# Patient Record
Sex: Male | Born: 1960
Health system: Southern US, Community
[De-identification: ages and names within clinical notes are randomized; demographics above are authoritative.]

## PROBLEM LIST (undated history)

## (undated) DIAGNOSIS — K219 Gastro-esophageal reflux disease without esophagitis: Secondary | ICD-10-CM

## (undated) DIAGNOSIS — I10 Essential (primary) hypertension: Secondary | ICD-10-CM

## (undated) DIAGNOSIS — F419 Anxiety disorder, unspecified: Secondary | ICD-10-CM

## (undated) HISTORY — DX: Gastro-esophageal reflux disease without esophagitis: K21.9

## (undated) HISTORY — PX: EYE SURGERY: SHX253

## (undated) HISTORY — PX: NO PAST SURGERIES: SHX2092

---

## 2004-07-26 ENCOUNTER — Ambulatory Visit: Payer: Self-pay | Admitting: Internal Medicine

## 2004-08-03 ENCOUNTER — Ambulatory Visit: Payer: Self-pay | Admitting: Internal Medicine

## 2006-12-27 ENCOUNTER — Inpatient Hospital Stay: Payer: Self-pay | Admitting: Internal Medicine

## 2008-10-03 ENCOUNTER — Emergency Department: Payer: Self-pay | Admitting: Emergency Medicine

## 2009-11-29 ENCOUNTER — Emergency Department: Payer: Self-pay | Admitting: Emergency Medicine

## 2012-07-23 ENCOUNTER — Ambulatory Visit: Payer: Self-pay | Admitting: General Practice

## 2012-08-01 ENCOUNTER — Encounter: Payer: Self-pay | Admitting: Orthopedic Surgery

## 2012-08-24 ENCOUNTER — Encounter: Payer: Self-pay | Admitting: Orthopedic Surgery

## 2012-08-25 ENCOUNTER — Ambulatory Visit: Payer: Self-pay | Admitting: Orthopedic Surgery

## 2013-01-11 ENCOUNTER — Emergency Department: Payer: Self-pay | Admitting: Emergency Medicine

## 2013-01-11 LAB — URINALYSIS, COMPLETE
Bacteria: NONE SEEN
Bilirubin,UR: NEGATIVE
Blood: NEGATIVE
Glucose,UR: NEGATIVE mg/dL (ref 0–75)
Ketone: NEGATIVE
Leukocyte Esterase: NEGATIVE
Nitrite: NEGATIVE
Ph: 6 (ref 4.5–8.0)
Protein: 30
RBC,UR: 1 /HPF (ref 0–5)
Specific Gravity: 1.027 (ref 1.003–1.030)
Squamous Epithelial: <1
WBC UR: <1 /HPF (ref 0–5)

## 2013-01-11 LAB — COMPREHENSIVE METABOLIC PANEL
Albumin: 3.5 g/dL (ref 3.4–5.0)
Alkaline Phosphatase: 110 U/L
Anion Gap: 3 — ABNORMAL LOW (ref 7–16)
BUN: 21 mg/dL — ABNORMAL HIGH (ref 7–18)
Bilirubin,Total: 0.3 mg/dL (ref 0.2–1.0)
Calcium, Total: 8.8 mg/dL (ref 8.5–10.1)
Chloride: 105 mmol/L (ref 98–107)
Co2: 29 mmol/L (ref 21–32)
Creatinine: 1.04 mg/dL (ref 0.60–1.30)
EGFR (African American): >60
EGFR (Non-African Amer.): >60
Glucose: 106 mg/dL — ABNORMAL HIGH (ref 65–99)
Osmolality: 277 (ref 275–301)
Potassium: 3.7 mmol/L (ref 3.5–5.1)
SGOT(AST): 31 U/L (ref 15–37)
SGPT (ALT): 27 U/L (ref 12–78)
Sodium: 137 mmol/L (ref 136–145)
Total Protein: 8.2 g/dL (ref 6.4–8.2)

## 2013-01-11 LAB — CBC
HCT: 43.7 % (ref 40.0–52.0)
HGB: 14.3 g/dL (ref 13.0–18.0)
MCH: 28.9 pg (ref 26.0–34.0)
MCHC: 32.7 g/dL (ref 32.0–36.0)
MCV: 88 fL (ref 80–100)
Platelet: 296 10*3/uL (ref 150–440)
RBC: 4.95 10*6/uL (ref 4.40–5.90)
RDW: 12.8 % (ref 11.5–14.5)
WBC: 8.7 10*3/uL (ref 3.8–10.6)

## 2013-01-11 LAB — LIPASE, BLOOD: Lipase: 230 U/L (ref 73–393)

## 2013-12-27 ENCOUNTER — Encounter (INDEPENDENT_AMBULATORY_CARE_PROVIDER_SITE_OTHER): Payer: Self-pay | Admitting: *Deleted

## 2013-12-27 NOTE — Telephone Encounter (Signed)
This encounter was created in error - please disregard.

## 2014-12-22 ENCOUNTER — Other Ambulatory Visit: Payer: Self-pay | Admitting: Family Medicine

## 2014-12-22 NOTE — Telephone Encounter (Signed)
Call for apt 

## 2015-02-06 ENCOUNTER — Encounter: Payer: Self-pay | Admitting: Family Medicine

## 2015-03-11 ENCOUNTER — Ambulatory Visit: Payer: Self-pay | Admitting: Family Medicine

## 2015-04-20 DIAGNOSIS — H4042X0 Glaucoma secondary to eye inflammation, left eye, stage unspecified: Secondary | ICD-10-CM | POA: Diagnosis not present

## 2015-04-21 DIAGNOSIS — H4042X3 Glaucoma secondary to eye inflammation, left eye, severe stage: Secondary | ICD-10-CM | POA: Diagnosis not present

## 2015-04-29 ENCOUNTER — Encounter: Payer: Self-pay | Admitting: *Deleted

## 2015-04-29 ENCOUNTER — Encounter: Payer: Self-pay | Admitting: Anesthesiology

## 2015-04-30 ENCOUNTER — Ambulatory Visit: Payer: Self-pay | Admitting: Family Medicine

## 2015-04-30 NOTE — Discharge Instructions (Signed)

## 2015-05-04 ENCOUNTER — Encounter
Admission: RE | Disposition: A | Discharge status: Home / Self Care | Payer: Self-pay | Source: Ambulatory Visit | Attending: Ophthalmology

## 2015-05-04 ENCOUNTER — Ambulatory Visit: Payer: Self-pay | Admitting: Physician Assistant

## 2015-05-04 ENCOUNTER — Ambulatory Visit
Admission: RE | Admit: 2015-05-04 | Discharge: 2015-05-04 | Disposition: A | Discharge status: Home / Self Care | Payer: 59 | Source: Ambulatory Visit | Attending: Ophthalmology | Admitting: Ophthalmology

## 2015-05-04 ENCOUNTER — Encounter: Payer: Self-pay | Admitting: Physician Assistant

## 2015-05-04 VITALS — BP 180/95 | HR 66 | Temp 97.9°F

## 2015-05-04 DIAGNOSIS — Z5329 Procedure and treatment not carried out because of patient's decision for other reasons: Secondary | ICD-10-CM | POA: Diagnosis not present

## 2015-05-04 DIAGNOSIS — H4042X0 Glaucoma secondary to eye inflammation, left eye, stage unspecified: Secondary | ICD-10-CM | POA: Insufficient documentation

## 2015-05-04 DIAGNOSIS — J3089 Other allergic rhinitis: Secondary | ICD-10-CM

## 2015-05-04 DIAGNOSIS — I10 Essential (primary) hypertension: Secondary | ICD-10-CM

## 2015-05-04 DIAGNOSIS — K219 Gastro-esophageal reflux disease without esophagitis: Secondary | ICD-10-CM

## 2015-05-04 HISTORY — DX: Essential (primary) hypertension: I10

## 2015-05-04 SURGERY — INSERTION, GLAUCOMA VALVE, AHMED
Anesthesia: Topical | Laterality: Left

## 2015-05-04 MED ORDER — LACTATED RINGERS IV SOLN: INTRAVENOUS | Status: DC

## 2015-05-04 MED ORDER — PANTOPRAZOLE SODIUM 40 MG PO TBEC: 40.0000 mg | DELAYED_RELEASE_TABLET | Freq: Every day | ORAL | Status: DC

## 2015-05-04 MED ORDER — BENAZEPRIL HCL 40 MG PO TABS: 40.0000 mg | ORAL_TABLET | Freq: Every day | ORAL | Status: DC

## 2015-05-04 MED ORDER — MOXIFLOXACIN HCL 0.5 % OP SOLN: 1.0000 [drp] | OPHTHALMIC | Status: DC | PRN

## 2015-05-04 MED ORDER — FLUTICASONE PROPIONATE 50 MCG/ACT NA SUSP: 2.0000 | Freq: Every day | NASAL | Status: DC

## 2015-05-04 SURGICAL SUPPLY — 35 items
APPLICATOR COTTON TIP 3IN (MISCELLANEOUS) ×2 IMPLANT
BANDAGE EYE OVAL (MISCELLANEOUS) ×4 IMPLANT
BLADE SCLEROTME MULTI-SIDE (MISCELLANEOUS) IMPLANT
CANNULA ANT/CHMB 27GA (MISCELLANEOUS) ×4 IMPLANT
CORD BIP STRL DISP 12FT (MISCELLANEOUS) ×2 IMPLANT
CUP MEDICINE 2OZ PLAST GRAD ST (MISCELLANEOUS) ×2 IMPLANT
ERASER TAPRD BLUNT STR 20-23GA (MISCELLANEOUS) ×1 IMPLANT
GLOVE BIO SURGEON STRL SZ7 (GLOVE) ×2 IMPLANT
GLOVE SURG LX 6.5 MICRO (GLOVE) ×1
GLOVE SURG LX STRL 6.5 MICRO (GLOVE) ×1 IMPLANT
GOWN STRL REUS W/ TWL LRG LVL3 (GOWN DISPOSABLE) ×2 IMPLANT
GOWN STRL REUS W/TWL LRG LVL3 (GOWN DISPOSABLE) ×2
KNIFE OPTIMUM SIDEPORT 15DEG (MISCELLANEOUS) IMPLANT
KNIFE SIDECUT EYE (MISCELLANEOUS) ×2 IMPLANT
MARKER SKIN DUAL TIP RULER LAB (MISCELLANEOUS) ×2 IMPLANT
NDL SAFETY 22GX1.5 (NEEDLE) ×2 IMPLANT
NEEDLE FILTER BLUNT 18X 1/2SAF (NEEDLE) ×2
NEEDLE FILTER BLUNT 18X1 1/2 (NEEDLE) ×2 IMPLANT
NEEDLE HYPO 30GX1 BEV (NEEDLE) IMPLANT
PACK EYE AFTER SURG (MISCELLANEOUS) ×2 IMPLANT
PROTECTOR LASIK FLAP (MISCELLANEOUS) ×2 IMPLANT
SOL BAL SALT 15ML (MISCELLANEOUS) ×4
SOLUTION BAL SALT 15ML (MISCELLANEOUS) ×2 IMPLANT
SPONGE SURG I SPEAR (MISCELLANEOUS) ×6 IMPLANT
SUT ETHILON 10-0 CS-B-6CS-B-6 (SUTURE)
SUT ETHILON 8 0 TG100 8 (SUTURE) ×2 IMPLANT
SUT VICRYL 7 0 TG140 8 (SUTURE) ×2 IMPLANT
SUT VICRYL 8 0 BV 130 5 (SUTURE) ×2 IMPLANT
SUTURE EHLN 10-0 CS-B-6CS-B-6 (SUTURE) IMPLANT
SYR 3ML LL SCALE MARK (SYRINGE) ×8 IMPLANT
SYR 5ML LL (SYRINGE) IMPLANT
SYRINGE 10CC LL (SYRINGE) ×2 IMPLANT
TAPERED BLUNT TIP STR 20-23GA (MISCELLANEOUS) ×2
WATER STERILE IRR 250ML POUR (IV SOLUTION) ×2 IMPLANT
WIPE NON LINTING 3.25X3.25 (MISCELLANEOUS) ×2 IMPLANT

## 2015-05-04 NOTE — Progress Notes (Signed)
S: states his bp has been elevated, hasn't taken his bp meds in a year, went to have surgery on his eye this morning and it was so high they wouldn't do anything, cannot see his pcp until next week; also out of reflux med and nasal spray; denies headache, cp/sob, swelling in legs, remainder ros neg  O: bp elevated at 180/95 , perrl eomi, lungs c t a, cv rrr, no pedal edema  A: htn  P: one month rx  For benazepril 40mg  qd, protonix 40mg , and flonase, discussed htn with patient and family member; pt knows to f/u with his pcp as needs a yearly physical and his physician needs to monitor his bp

## 2015-05-04 NOTE — Progress Notes (Signed)
Patient verbalizes he is anxious about having this procedure done today. Dr.Vin & Dr. Jaci Standard spoke with patient about the procedure and anesthesia. Patient verbalizes he is unsure if he can proceed with this procedure today. Patient is being given a few moments to talk with his family privately.  0945 Patient has decided to not go through with procedure today. Dr. Cephus Shelling informed. Dr. Loletta Parish she will have the office call patient to reschedule.

## 2015-05-11 ENCOUNTER — Encounter: Payer: Self-pay | Admitting: *Deleted

## 2015-05-13 ENCOUNTER — Ambulatory Visit (INDEPENDENT_AMBULATORY_CARE_PROVIDER_SITE_OTHER): Payer: 59 | Admitting: Family Medicine

## 2015-05-13 ENCOUNTER — Encounter: Payer: Self-pay | Admitting: Family Medicine

## 2015-05-13 VITALS — BP 179/78 | HR 65 | Temp 98.9°F | Ht 65.6 in | Wt 160.0 lb

## 2015-05-13 DIAGNOSIS — I10 Essential (primary) hypertension: Secondary | ICD-10-CM | POA: Diagnosis not present

## 2015-05-13 MED ORDER — BENAZEPRIL HCL 40 MG PO TABS: 40.0000 mg | ORAL_TABLET | Freq: Every day | ORAL | Status: DC

## 2015-05-13 MED ORDER — AMLODIPINE BESYLATE 5 MG PO TABS: 10.0000 mg | ORAL_TABLET | Freq: Every day | ORAL | Status: DC

## 2015-05-13 NOTE — Assessment & Plan Note (Signed)
Discuss poor control and need to get urgent control because of upcoming surgery will start amlodipine 5 mg to take 2 tablets a day. Patient will monitor blood pressure if gets too low will drop back to 5 also discussed side effect of ankle edema will put up with that if it's a problem until after I surgery to control blood pressure.

## 2015-05-13 NOTE — Progress Notes (Signed)
   BP 179/78 mmHg  Pulse 65  Temp(Src) 98.9 F (37.2 C)  Ht 5' 5.6" (1.666 m)  Wt 160 lb (72.576 kg)  BMI 26.15 kg/m2  SpO2 99%   Subjective:    Patient ID: Shaun Carney, male    DOB: Mar 05, 1960, 55 y.o.   MRN: [id number]  HPI: Shaun Carney is a 55 y.o. male  Chief Complaint  Patient presents with  . Hypertension  Discuss hypertension patient's been off medicines for some time has eye surgery scheduled had to be canceled for glaucoma as blood pressure was too high restarted Benzapril 40 mg for this last week blood pressures come down some but not a lot but it's actually better than it has been. Discussed patient has surgery scheduled in 5 days again for his I need to get blood pressure down so can save his vision. Otherwise patient feels well  Relevant past medical, surgical, family and social history reviewed and updated as indicated. Interim medical history since our last visit reviewed. Allergies and medications reviewed and updated.  Review of Systems  Constitutional: Negative.   Respiratory: Negative.   Cardiovascular: Negative.     Per HPI unless specifically indicated above     Objective:    BP 179/78 mmHg  Pulse 65  Temp(Src) 98.9 F (37.2 C)  Ht 5' 5.6" (1.666 m)  Wt 160 lb (72.576 kg)  BMI 26.15 kg/m2  SpO2 99%  Wt Readings from Last 3 Encounters:  05/13/15 160 lb (72.576 kg)  05/11/15 158 lb (71.668 kg)  05/04/15 158 lb (71.668 kg)    Physical Exam  Constitutional: He is oriented to person, place, and time. He appears well-developed and well-nourished. No distress.  HENT:  Head: Normocephalic and atraumatic.  Right Ear: Hearing normal.  Left Ear: Hearing normal.  Nose: Nose normal.  Eyes: Conjunctivae and lids are normal. Right eye exhibits no discharge. Left eye exhibits no discharge. No scleral icterus.  Cardiovascular: Normal rate, regular rhythm and normal heart sounds.   Pulmonary/Chest: Effort normal and breath sounds normal. No respiratory  distress.  Musculoskeletal: Normal range of motion.  Neurological: He is alert and oriented to person, place, and time.  Skin: Skin is intact. No rash noted.  Psychiatric: He has a normal mood and affect. His speech is normal and behavior is normal. Judgment and thought content normal. Cognition and memory are normal.        Assessment & Plan:   Problem List Items Addressed This Visit      Cardiovascular and Mediastinum   Essential hypertension - Primary    Discuss poor control and need to get urgent control because of upcoming surgery will start amlodipine 5 mg to take 2 tablets a day. Patient will monitor blood pressure if gets too low will drop back to 5 also discussed side effect of ankle edema will put up with that if it's a problem until after I surgery to control blood pressure.          Follow up plan: Return in about 4 weeks (around 06/10/2015) for Check BMP.

## 2015-05-15 NOTE — Discharge Instructions (Signed)

## 2015-05-18 ENCOUNTER — Ambulatory Visit: Payer: 59 | Admitting: Anesthesiology

## 2015-05-18 ENCOUNTER — Ambulatory Visit
Admission: RE | Admit: 2015-05-18 | Discharge: 2015-05-18 | Disposition: A | Discharge status: Home / Self Care | Payer: 59 | Source: Ambulatory Visit | Attending: Ophthalmology | Admitting: Ophthalmology

## 2015-05-18 ENCOUNTER — Encounter
Admission: RE | Disposition: A | Discharge status: Home / Self Care | Payer: Self-pay | Source: Ambulatory Visit | Attending: Ophthalmology

## 2015-05-18 DIAGNOSIS — Z7951 Long term (current) use of inhaled steroids: Secondary | ICD-10-CM | POA: Diagnosis not present

## 2015-05-18 DIAGNOSIS — Z9109 Other allergy status, other than to drugs and biological substances: Secondary | ICD-10-CM | POA: Diagnosis not present

## 2015-05-18 DIAGNOSIS — H209 Unspecified iridocyclitis: Secondary | ICD-10-CM | POA: Insufficient documentation

## 2015-05-18 DIAGNOSIS — H4042X0 Glaucoma secondary to eye inflammation, left eye, stage unspecified: Secondary | ICD-10-CM | POA: Insufficient documentation

## 2015-05-18 DIAGNOSIS — Z79899 Other long term (current) drug therapy: Secondary | ICD-10-CM | POA: Diagnosis not present

## 2015-05-18 DIAGNOSIS — I10 Essential (primary) hypertension: Secondary | ICD-10-CM | POA: Diagnosis not present

## 2015-05-18 HISTORY — PX: INSERTION OF AHMED VALVE: SHX6254

## 2015-05-18 SURGERY — INSERTION, GLAUCOMA VALVE, AHMED
Anesthesia: Monitor Anesthesia Care | Laterality: Left | Wound class: Clean

## 2015-05-18 MED ORDER — LIDOCAINE HCL (PF) 4 % IJ SOLN
INTRAMUSCULAR | Status: DC | PRN
  Administered 2015-05-18: 1 mL via INTRADERMAL

## 2015-05-18 MED ORDER — MOXIFLOXACIN HCL 0.5 % OP SOLN
1.0000 [drp] | OPHTHALMIC | Status: DC | PRN
  Administered 2015-05-18 (×3): 1 [drp] via OPHTHALMIC

## 2015-05-18 MED ORDER — BSS IO SOLN
INTRAOCULAR | Status: DC | PRN
  Administered 2015-05-18: 15 mL via INTRAOCULAR

## 2015-05-18 MED ORDER — NEOMYCIN-POLYMYXIN-DEXAMETH 3.5-10000-0.1 OP OINT
TOPICAL_OINTMENT | OPHTHALMIC | Status: DC | PRN
  Administered 2015-05-18: 1 via OPHTHALMIC

## 2015-05-18 MED ORDER — FENTANYL CITRATE (PF) 100 MCG/2ML IJ SOLN
INTRAMUSCULAR | Status: DC | PRN
  Administered 2015-05-18: 100 ug via INTRAVENOUS

## 2015-05-18 MED ORDER — LACTATED RINGERS IV SOLN: INTRAVENOUS | Status: DC

## 2015-05-18 MED ORDER — ACETAMINOPHEN 160 MG/5ML PO SOLN: 325.0000 mg | ORAL | Status: DC | PRN

## 2015-05-18 MED ORDER — MIDAZOLAM HCL 2 MG/2ML IJ SOLN
INTRAMUSCULAR | Status: DC | PRN
  Administered 2015-05-18: 2 mg via INTRAVENOUS

## 2015-05-18 MED ORDER — ACETAMINOPHEN 325 MG PO TABS: 325.0000 mg | ORAL_TABLET | ORAL | Status: DC | PRN

## 2015-05-18 SURGICAL SUPPLY — 38 items
ALLOGRAFT TUTOPLST SCER0.5X1.0 (Tissue) ×1 IMPLANT
APPLICATOR COTTON TIP 3IN (MISCELLANEOUS) ×2 IMPLANT
BANDAGE EYE OVAL (MISCELLANEOUS) ×4 IMPLANT
BLADE SCLEROTME MULTI-SIDE (MISCELLANEOUS) IMPLANT
CANNULA ANT/CHMB 27GA (MISCELLANEOUS) ×4 IMPLANT
CORD BIP STRL DISP 12FT (MISCELLANEOUS) ×2 IMPLANT
CUP MEDICINE 2OZ PLAST GRAD ST (MISCELLANEOUS) ×2 IMPLANT
ERASER TAPRD BLUNT STR 20-23GA (MISCELLANEOUS) ×1 IMPLANT
GLOVE BIO SURGEON STRL SZ7 (GLOVE) ×2 IMPLANT
GLOVE SURG LX 6.5 MICRO (GLOVE) ×1
GLOVE SURG LX STRL 6.5 MICRO (GLOVE) ×1 IMPLANT
GOWN STRL REUS W/ TWL LRG LVL3 (GOWN DISPOSABLE) ×2 IMPLANT
GOWN STRL REUS W/TWL LRG LVL3 (GOWN DISPOSABLE) ×2
KNIFE OPTIMUM SIDEPORT 15DEG (MISCELLANEOUS) IMPLANT
KNIFE SIDECUT EYE (MISCELLANEOUS) ×2 IMPLANT
MARKER SKIN DUAL TIP RULER LAB (MISCELLANEOUS) ×2 IMPLANT
NDL SAFETY 22GX1.5 (NEEDLE) ×2 IMPLANT
NEEDLE FILTER BLUNT 18X 1/2SAF (NEEDLE) ×2
NEEDLE FILTER BLUNT 18X1 1/2 (NEEDLE) ×2 IMPLANT
NEEDLE HYPO 30GX1 BEV (NEEDLE) IMPLANT
PACK EYE AFTER SURG (MISCELLANEOUS) ×2 IMPLANT
PROTECTOR LASIK FLAP (MISCELLANEOUS) ×2 IMPLANT
SOL BAL SALT 15ML (MISCELLANEOUS) ×4
SOLUTION BAL SALT 15ML (MISCELLANEOUS) ×2 IMPLANT
SPONGE SURG I SPEAR (MISCELLANEOUS) ×6 IMPLANT
SUT ETHILON 10-0 CS-B-6CS-B-6 (SUTURE) ×2
SUT ETHILON 8 0 TG100 8 (SUTURE) ×2 IMPLANT
SUT VICRYL 7 0 TG140 8 (SUTURE) ×2 IMPLANT
SUT VICRYL 8 0 BV 130 5 (SUTURE) ×2 IMPLANT
SUTURE EHLN 10-0 CS-B-6CS-B-6 (SUTURE) ×1 IMPLANT
SYR 3ML LL SCALE MARK (SYRINGE) ×8 IMPLANT
SYR 5ML LL (SYRINGE) IMPLANT
SYRINGE 10CC LL (SYRINGE) ×2 IMPLANT
TAPERED BLUNT TIP STR 20-23GA (MISCELLANEOUS) ×2
TUTOPLAST SCIERA 0.5X1.0 (Tissue) ×2 IMPLANT
VALVE GLAUCOMA AHMED (Prosthesis & Implant Heart) ×2 IMPLANT
WATER STERILE IRR 250ML POUR (IV SOLUTION) ×2 IMPLANT
WIPE NON LINTING 3.25X3.25 (MISCELLANEOUS) ×2 IMPLANT

## 2015-05-18 NOTE — H&P (Signed)
H+P reviewed and is up to date, please see paper chart.  

## 2015-05-18 NOTE — Anesthesia Postprocedure Evaluation (Signed)
Anesthesia Post Note  Patient: Shaun Carney  Procedure(s) Performed: Procedure(s) (LRB): INSERTION OF AHMED VALVE AND SCLERAL PATCH GRAFT TO LEFT EYE (Left)  Patient location during evaluation: PACU Anesthesia Type: MAC Level of consciousness: awake and alert and oriented Pain management: satisfactory to patient Vital Signs Assessment: post-procedure vital signs reviewed and stable Respiratory status: spontaneous breathing, nonlabored ventilation and respiratory function stable Cardiovascular status: blood pressure returned to baseline and stable Postop Assessment: Adequate PO intake and No signs of nausea or vomiting Anesthetic complications: no    Raliegh Ip

## 2015-05-18 NOTE — Transfer of Care (Signed)
Immediate Anesthesia Transfer of Care Note  Patient: Shaun Carney  Procedure(s) Performed: Procedure(s) with comments: INSERTION OF AHMED VALVE AND SCLERAL PATCH GRAFT TO LEFT EYE (Left) - 1ST CASE PER DR VIN  Patient Location: PACU  Anesthesia Type: MAC  Level of Consciousness: awake, alert  and patient cooperative  Airway and Oxygen Therapy: Patient Spontanous Breathing and Patient connected to supplemental oxygen  Post-op Assessment: Post-op Vital signs reviewed, Patient's Cardiovascular Status Stable, Respiratory Function Stable, Patent Airway and No signs of Nausea or vomiting  Post-op Vital Signs: Reviewed and stable  Complications: No apparent anesthesia complications

## 2015-05-18 NOTE — Anesthesia Procedure Notes (Signed)
Procedure Name: MAC Performed by: Kritika Stukes Pre-anesthesia Checklist: Patient identified, Emergency Drugs available, Suction available, Timeout performed and Patient being monitored Patient Re-evaluated:Patient Re-evaluated prior to inductionOxygen Delivery Method: Nasal cannula Placement Confirmation: positive ETCO2     

## 2015-05-18 NOTE — Anesthesia Preprocedure Evaluation (Signed)
Anesthesia Evaluation  Patient identified by MRN, date of birth, ID band  Reviewed: Allergy & Precautions, H&P , NPO status , Patient's Chart, lab work & pertinent test results  Airway Mallampati: II  TM Distance: >3 FB Neck ROM: full    Dental no notable dental hx.    Pulmonary    Pulmonary exam normal       Cardiovascular hypertension, Rhythm:regular Rate:Normal     Neuro/Psych    GI/Hepatic GERD-  ,  Endo/Other    Renal/GU      Musculoskeletal   Abdominal   Peds  Hematology   Anesthesia Other Findings   Reproductive/Obstetrics                             Anesthesia Physical Anesthesia Plan  ASA: II  Anesthesia Plan: MAC   Post-op Pain Management:    Induction:   Airway Management Planned:   Additional Equipment:   Intra-op Plan:   Post-operative Plan:   Informed Consent: I have reviewed the patients History and Physical, chart, labs and discussed the procedure including the risks, benefits and alternatives for the proposed anesthesia with the patient or authorized representative who has indicated his/her understanding and acceptance.     Plan Discussed with: CRNA  Anesthesia Plan Comments:         Anesthesia Quick Evaluation  

## 2015-05-18 NOTE — Op Note (Signed)
Date of Surgery: 05/18/15  PREOPERATIVE DIAGNOSES: 1. Medically uncontrolled uveitic glaucoma, left eye.  POSTOPERATIVE DIAGNOSES: 1. Same  PROCEDURE PERFORMED: 1. Ahmed drainage device placement, left eye. 2. Coverage of glaucoma drainage device with Tutoplast sclera, left eye.  SURGEON: Almon Hercules, MD.    ANESTHESIA: Monitored anesthesia care.  IMPLANTS: Ahmed FP-7  COMPLICATIONS: None.  DESCRIPTION OF PROCEDURE: After informed consent was obtained, the patient was brought to the operating room and placed in the supine position.  The patient was then prepped and draped in the usual sterile fashion for intraocular surgery on the right eye.  A wire lid speculum was placed.  A 7-0 vicryl suture was placed through the superotemporal limbal cornea and the eye was rotated to expose the superotemporal quadrant.  Using Westcott scissors, a small incision through conjunctiva and Tenons was made in the superotemporal quadrant approximately 4 mm posterior to the limbus. A block which consisted of 2 mL of 50% of 4% Xylocaine without epinephrine and 50% of 0.75% Marcaine was given at sub-Tenons level. Tenons were then dissected from sclera posteriorly and anteriorly with blunt Westcott dissection. Hemostasis was achieved with cautery.  An Ahmed drainage device, model FP7, was removed from its packaging, inspected, and found to be in good condition.  Balanced salt solution on a cannula was used to irrigate the tube, and free flow was noted above the plate. The implant was placed in the retrobulbar space between the superior and lateral rectus muscles and two 8-0 nylon sutures were placed through the eyelets of the implant. A 22-gauge needle was used to enter the anterior chamber 3 mm from the superior limbus supero-temporally.  The tube was trimmed to length andplaced through the needle tract and set to rest above the iris with no corneal touch noted.  The tube was then approximated to the globe using a  single 8-0 nylon figure-of- eight suture.  Donor scleral overlay placed over the tube and sewn in place using 1 interrupted 7-0 vicryl suture.  The conjunctival incision was closed with running 8-0 Vicryl sutures.  At the end of the case, the corneal limbal traction suture was removed as was the wire lid speculum.  The eye was dressed with an application of Maxitrol ointment, and a Fox shield was placed.  The patient was brought to the recovery area having tolerated the procedure with no complications.

## 2015-05-19 ENCOUNTER — Emergency Department: Payer: 59

## 2015-05-19 ENCOUNTER — Emergency Department
Admission: EM | Admit: 2015-05-19 | Discharge: 2015-05-19 | Disposition: A | Discharge status: Home / Self Care | Payer: 59 | Attending: Emergency Medicine | Admitting: Emergency Medicine

## 2015-05-19 ENCOUNTER — Encounter: Payer: Self-pay | Admitting: *Deleted

## 2015-05-19 DIAGNOSIS — M545 Low back pain, unspecified: Secondary | ICD-10-CM

## 2015-05-19 DIAGNOSIS — I10 Essential (primary) hypertension: Secondary | ICD-10-CM | POA: Insufficient documentation

## 2015-05-19 MED ORDER — CYCLOBENZAPRINE HCL 10 MG PO TABS
5.0000 mg | ORAL_TABLET | Freq: Once | ORAL | Status: AC
Start: 2015-05-19 — End: 2015-05-19
  Administered 2015-05-19: 5 mg via ORAL
  Filled 2015-05-19: qty 1

## 2015-05-19 MED ORDER — TRAMADOL HCL 50 MG PO TABS
50.0000 mg | ORAL_TABLET | Freq: Once | ORAL | Status: AC
  Administered 2015-05-19: 50 mg via ORAL
  Filled 2015-05-19: qty 1

## 2015-05-19 MED ORDER — TRAMADOL HCL 50 MG PO TABS: 50.0000 mg | ORAL_TABLET | Freq: Four times a day (QID) | ORAL | Status: DC | PRN

## 2015-05-19 MED ORDER — CYCLOBENZAPRINE HCL 5 MG PO TABS: 5.0000 mg | ORAL_TABLET | Freq: Three times a day (TID) | ORAL | Status: DC | PRN

## 2015-05-19 NOTE — Discharge Instructions (Signed)

## 2015-05-19 NOTE — ED Notes (Signed)
Pt reports that he has back pain. He felt a pop when he bent over. Pain in lower left side of back. Similar injury 3 years ago.

## 2015-05-19 NOTE — ED Notes (Signed)
States he was bending over in the bathroom and felt a pop in his back, states hx of chronic back pain

## 2015-05-19 NOTE — ED Provider Notes (Signed)
[facility] Emergency Department Provider Note ____________________________________________  Time seen: Approximately 11:14 AM  I have reviewed the triage vital signs and the nursing notes.   HISTORY  Chief Complaint Back Pain    HPI Shaun Carney is a 55 y.o. male, NAD, presents to the emergency room today with low back pain. He states that he was cleaning himself this morning and bent forward when he heard a pop in his back and then became unable to move. He states the pain is more intense when he moves, especially when he rotates. He is able to walk but says he stays bent over. Has not taken any medications to help relieve the pain. He states that several years ago he had a back injury but he did not want it to be surgically corrected.   Past Medical History  Diagnosis Date  . GERD (gastroesophageal reflux disease)   . Hypertension     Patient Active Problem List   Diagnosis Date Noted  . Essential hypertension 05/13/2015    Past Surgical History  Procedure Laterality Date  . No past surgeries      Current Outpatient Rx  Name  Route  Sig  Dispense  Refill  . amLODipine (NORVASC) 5 MG tablet   Oral   Take 2 tablets (10 mg total) by mouth daily.   60 tablet   1   . benazepril (LOTENSIN) 40 MG tablet   Oral   Take 1 tablet (40 mg total) by mouth daily.   30 tablet   0   . cyclobenzaprine (FLEXERIL) 5 MG tablet   Oral   Take 1 tablet (5 mg total) by mouth 3 (three) times daily as needed for muscle spasms.   30 tablet   0   . DUREZOL 0.05 % EMUL            0     Dispense as written.   . fluticasone (FLONASE) 50 MCG/ACT nasal spray   Each Nare   Place 2 sprays into both nostrils daily.   16 g   0   . pantoprazole (PROTONIX) 40 MG tablet   Oral   Take 1 tablet (40 mg total) by mouth daily.   30 tablet   0   . traMADol (ULTRAM) 50 MG tablet   Oral   Take 1 tablet (50 mg total) by mouth every 6 (six) hours as needed.   12  tablet   0   . VIGAMOX 0.5 % ophthalmic solution            0     Dispense as written.     Allergies Review of patient's allergies indicates no known allergies.  Family History  Problem Relation Age of Onset  . Cancer Mother     throat  . Hypertension Mother   . Heart attack Mother   . Cancer Father     lung    Social History Social History  Substance Use Topics  . Smoking status: Never Smoker   . Smokeless tobacco: Never Used  . Alcohol Use: 3.6 oz/week    6 Cans of beer per week     Comment: none in the last month    Review of Systems Constitutional: No recent illness. Eye: Recent eye surgery for glaucoma. Cardiovascular: Denies chest pain or palpitations. Respiratory: Denies shortness of breath. Gastrointestinal: No abdominal pain.  Genitourinary: Negative for dysuria. Negative for saddle anesthesia. Musculoskeletal: Pain in back pain. Positive for weakness due to pain while  walking. Skin: Negative for rash. Neurological: Negative for headaches, focal weakness or numbness. ____________________________________________   PHYSICAL EXAM:  VITAL SIGNS: ED Triage Vitals  Enc Vitals Group     BP 05/19/15 1056 158/89 mmHg     Pulse Rate 05/19/15 1056 94     Resp 05/19/15 1056 18     Temp 05/19/15 1056 98.7 F (37.1 C)     Temp Source 05/19/15 1056 Oral     SpO2 05/19/15 1056 97 %     Weight 05/19/15 1056 158 lb (71.668 kg)     Height 05/19/15 1056 5\' 7"  (1.702 m)     Head Cir --      Peak Flow --      Pain Score 05/19/15 1056 10     Pain Loc --      Pain Edu? --      Excl. in [place]? --     Constitutional: Alert and oriented. Well appearing and in no acute distress. Eyes: Left conjunctiva erythematous with clear discharge. Right conjunctiva is normal. EOMI. Head: Atraumatic. Nose: No congestion/rhinnorhea. Neck: No stridor.  Respiratory: Normal respiratory effort.   Musculoskeletal: TTP along the left sided lumbar spine and sacrum. ROM limited due  to pain but able to flex and extend. Rotation illicits pain in the lumbar spine.  Neurologic:  Normal speech and language. No gross focal neurologic deficits are appreciated. Speech is normal. No gait instability. Skin:  Skin is warm, dry and intact. Atraumatic. Psychiatric: Mood and affect are normal. Speech and behavior are normal.  ____________________________________________   LABS (all labs ordered are listed, but only abnormal results are displayed)  Labs Reviewed - No data to display ____________________________________________  RADIOLOGY  EXAM: LUMBAR SPINE - 2-3 VIEW  COMPARISON: CT scan of January 11, 2013.  FINDINGS: No fracture or spondylolisthesis is noted. Mild anterior osteophyte formation is noted at multiple levels in the lumbar spine. Disc spaces appear to be well maintained.  IMPRESSION: Mild degenerative changes. No acute abnormality seen in the lumbar spine. ____________________________________________   PROCEDURES  Procedure(s) performed: None   ____________________________________________   INITIAL IMPRESSION / ASSESSMENT AND PLAN / ED COURSE  Pertinent labs & imaging results that were available during my care of the patient were reviewed by me and considered in my medical decision making (see chart for details).  Acute lumbar back pain. Patient discharged home with flexeril and tramadol for pain control. Advised that if the pain does not subside then he should make an appointment with an orthopedist. He is to return to the ER for symptoms that change or worsen if he is unable to schedule an appointment with his PCP or orthopedics. ____________________________________________   FINAL CLINICAL IMPRESSION(S) / ED DIAGNOSES  Final diagnoses:  Acute lumbar back pain      Victorino Dike, FNP 05/19/15 1543  Daymon Larsen, MD 05/21/15 1455

## 2015-05-19 NOTE — ED Notes (Signed)
Pt discharged home after verbalizing understanding of discharge instructions; nad noted. 

## 2015-06-10 ENCOUNTER — Encounter: Payer: Self-pay | Admitting: Family Medicine

## 2015-06-10 ENCOUNTER — Ambulatory Visit (INDEPENDENT_AMBULATORY_CARE_PROVIDER_SITE_OTHER): Payer: 59 | Admitting: Family Medicine

## 2015-06-10 VITALS — BP 144/79 | HR 89 | Temp 98.1°F | Ht 65.6 in | Wt 155.0 lb

## 2015-06-10 DIAGNOSIS — Z0000 Encounter for general adult medical examination without abnormal findings: Secondary | ICD-10-CM | POA: Diagnosis not present

## 2015-06-10 DIAGNOSIS — N529 Male erectile dysfunction, unspecified: Secondary | ICD-10-CM

## 2015-06-10 DIAGNOSIS — I10 Essential (primary) hypertension: Secondary | ICD-10-CM

## 2015-06-10 MED ORDER — AMLODIPINE BESYLATE 10 MG PO TABS: 10.0000 mg | ORAL_TABLET | Freq: Every day | ORAL | Status: DC

## 2015-06-10 MED ORDER — BENAZEPRIL HCL 40 MG PO TABS: 40.0000 mg | ORAL_TABLET | Freq: Every day | ORAL | Status: DC

## 2015-06-10 MED ORDER — SILDENAFIL CITRATE 20 MG PO TABS: 20.0000 mg | ORAL_TABLET | Freq: Every day | ORAL | Status: DC | PRN

## 2015-06-10 NOTE — Progress Notes (Signed)
   BP 144/79 mmHg  Pulse 89  Temp(Src) 98.1 F (36.7 C)  Ht 5' 5.6" (1.666 m)  Wt 155 lb (70.308 kg)  BMI 25.33 kg/m2  SpO2 96%   Subjective:    Patient ID: Shaun Carney, male    DOB: February 08, 1960, 55 y.o.   MRN: [id number]  HPI: Shaun Carney is a 55 y.o. male  Chief Complaint  Patient presents with  . Hypertension  recheck hypertension doing well taking amlodipine 10 mg without side effects or problems no issues with swelling ankles taking Benzapril 40 without problems. Has lost weight as medicine seems to be taken some of his appetite away. Taking medications faithfully and is about to run out. Had eye surgery which is been successful recovering well Had a lot of stress at work which is seen to have gotten better..   Relevant past medical, surgical, family and social history reviewed and updated as indicated. Interim medical history since our last visit reviewed. Allergies and medications reviewed and updated.  Review of Systems  Constitutional: Negative.   Respiratory: Negative.   Cardiovascular: Negative.     Per HPI unless specifically indicated above     Objective:    BP 144/79 mmHg  Pulse 89  Temp(Src) 98.1 F (36.7 C)  Ht 5' 5.6" (1.666 m)  Wt 155 lb (70.308 kg)  BMI 25.33 kg/m2  SpO2 96%  Wt Readings from Last 3 Encounters:  06/10/15 155 lb (70.308 kg)  05/19/15 158 lb (71.668 kg)  05/18/15 164 lb (74.39 kg)    Physical Exam  Constitutional: He is oriented to person, place, and time. He appears well-developed and well-nourished. No distress.  HENT:  Head: Normocephalic and atraumatic.  Right Ear: Hearing normal.  Left Ear: Hearing normal.  Nose: Nose normal.  Eyes: Conjunctivae and lids are normal. Right eye exhibits no discharge. Left eye exhibits no discharge. No scleral icterus.  Cardiovascular: Normal rate and normal heart sounds.   Pulmonary/Chest: Effort normal and breath sounds normal. No respiratory distress.  Musculoskeletal: Normal range  of motion.  Neurological: He is alert and oriented to person, place, and time.  Skin: Skin is intact. No rash noted.  Psychiatric: He has a normal mood and affect. His speech is normal and behavior is normal. Judgment and thought content normal. Cognition and memory are normal.        Assessment & Plan:   Problem List Items Addressed This Visit      Cardiovascular and Mediastinum   Essential hypertension - Primary    The current medical regimen is effective;  continue present plan and medications. Not complete control will try better lifestyle      Relevant Medications   amLODipine (NORVASC) 10 MG tablet   benazepril (LOTENSIN) 40 MG tablet   sildenafil (REVATIO) 20 MG tablet   Other Relevant Orders   Basic metabolic panel     Genitourinary   ED (erectile dysfunction)    Trial of viagra      Relevant Medications   sildenafil (REVATIO) 20 MG tablet    Other Visit Diagnoses    Healthcare maintenance        Relevant Orders    HIV antibody    Hepatitis C Antibody        Follow up plan: Return in about 3 months (around 09/10/2015) for Physical Exam.

## 2015-06-10 NOTE — Assessment & Plan Note (Addendum)
The current medical regimen is effective;  continue present plan and medications. Not complete control will try better lifestyle

## 2015-06-10 NOTE — Assessment & Plan Note (Signed)
Trial of viagra

## 2015-06-11 ENCOUNTER — Encounter: Payer: Self-pay | Admitting: Family Medicine

## 2015-06-11 LAB — BASIC METABOLIC PANEL
BUN/Creatinine Ratio: 18 (ref 9–20)
BUN: 17 mg/dL (ref 6–24)
CO2: 26 mmol/L (ref 18–29)
Calcium: 9.7 mg/dL (ref 8.7–10.2)
Chloride: 102 mmol/L (ref 96–106)
Creatinine, Ser: 0.94 mg/dL (ref 0.76–1.27)
GFR calc Af Amer: 105 mL/min/{1.73_m2} (ref >59)
GFR calc non Af Amer: 91 mL/min/{1.73_m2} (ref >59)
Glucose: 87 mg/dL (ref 65–99)
Potassium: 3.8 mmol/L (ref 3.5–5.2)
Sodium: 145 mmol/L — ABNORMAL HIGH (ref 134–144)

## 2015-06-11 LAB — HEPATITIS C ANTIBODY: Hep C Virus Ab: <0.1 s/co ratio (ref 0.0–0.9)

## 2015-06-11 LAB — HIV ANTIBODY (ROUTINE TESTING W REFLEX): HIV Screen 4th Generation wRfx: NONREACTIVE

## 2015-07-03 DIAGNOSIS — H1131 Conjunctival hemorrhage, right eye: Secondary | ICD-10-CM | POA: Diagnosis not present

## 2015-10-07 ENCOUNTER — Encounter (INDEPENDENT_AMBULATORY_CARE_PROVIDER_SITE_OTHER): Payer: Self-pay

## 2015-10-08 ENCOUNTER — Ambulatory Visit (INDEPENDENT_AMBULATORY_CARE_PROVIDER_SITE_OTHER): Payer: BLUE CROSS/BLUE SHIELD | Admitting: Family Medicine

## 2015-10-08 ENCOUNTER — Encounter: Payer: Self-pay | Admitting: Family Medicine

## 2015-10-08 VITALS — BP 144/77 | HR 94 | Temp 98.3°F | Wt 158.0 lb

## 2015-10-08 DIAGNOSIS — J029 Acute pharyngitis, unspecified: Secondary | ICD-10-CM | POA: Diagnosis not present

## 2015-10-08 MED ORDER — LIDOCAINE VISCOUS 2 % MT SOLN: 5.0000 mL | OROMUCOSAL | 0 refills | Status: DC | PRN

## 2015-10-08 MED ORDER — BENZONATATE 100 MG PO CAPS
100.0000 mg | ORAL_CAPSULE | Freq: Three times a day (TID) | ORAL | 0 refills | Status: DC | PRN
Start: 2015-10-08 — End: 2016-01-05

## 2015-10-08 MED ORDER — HYDROCOD POLST-CPM POLST ER 10-8 MG/5ML PO SUER: 5.0000 mL | Freq: Two times a day (BID) | ORAL | 0 refills | Status: DC | PRN

## 2015-10-08 NOTE — Patient Instructions (Signed)
Follow up as needed

## 2015-10-08 NOTE — Progress Notes (Addendum)
   BP (!) 144/77 (BP Location: Right Arm, Cuff Size: Normal)   Pulse 94   Temp 98.3 F (36.8 C)   Wt 158 lb (71.7 kg) Comment: with shoes  SpO2 98%   BMI 25.81 kg/m    Subjective:    Patient ID: Shaun Carney, male    DOB: 1960/08/27, 55 y.o.   MRN: [id number]  HPI: Todd Weiher is a 55 y.o. male  Chief Complaint  Patient presents with  . URI    x 1 week, sore throat   Sore throat for about a week now. Feels like there is a ton of mucus in his throat and he has to hack it all up. Has also recently had some "cold symptoms". Has been using flonase and throat lozenges with no relief. Denies fever, chills, sweats, ear pain. Denies sick contacts.   Relevant past medical, surgical, family and social history reviewed and updated as indicated. Interim medical history since our last visit reviewed. Allergies and medications reviewed and updated.  Review of Systems  Constitutional: Negative.   HENT: Positive for congestion, postnasal drip, rhinorrhea and sore throat.   Respiratory: Positive for cough.   Cardiovascular: Negative.   Gastrointestinal: Negative.   Genitourinary: Negative.   Musculoskeletal: Negative.   Neurological: Negative.   Psychiatric/Behavioral: Negative.     Per HPI unless specifically indicated above     Objective:    BP (!) 144/77 (BP Location: Right Arm, Cuff Size: Normal)   Pulse 94   Temp 98.3 F (36.8 C)   Wt 158 lb (71.7 kg) Comment: with shoes  SpO2 98%   BMI 25.81 kg/m   Wt Readings from Last 3 Encounters:  10/08/15 158 lb (71.7 kg)  06/10/15 155 lb (70.3 kg)  05/19/15 158 lb (71.7 kg)    Physical Exam  Constitutional: He is oriented to person, place, and time. He appears well-developed and well-nourished.  HENT:  Head: Atraumatic.  Right Ear: External ear normal.  Left Ear: External ear normal.  Nose: Nose normal.  Mouth/Throat: No oropharyngeal exudate.  Posterior oropharynx erythematous, no tonsillar edema or exudates  Eyes:  Conjunctivae are normal. No scleral icterus.  Neck: Normal range of motion. Neck supple.  Cardiovascular: Normal rate, regular rhythm and normal heart sounds.   Pulmonary/Chest: Effort normal and breath sounds normal.  Musculoskeletal: Normal range of motion.  Lymphadenopathy:    He has no cervical adenopathy.  Neurological: He is alert and oriented to person, place, and time.  Skin: Skin is warm and dry.  Psychiatric: He has a normal mood and affect. His behavior is normal.  Nursing note and vitals reviewed.     Assessment & Plan:   Problem List Items Addressed This Visit    None    Visit Diagnoses    Sore throat    -  Primary   Neg strep, await cx. Viscous lidocaine, tessalon perles, and tussionex for nighttime cough given. Sedation risks discussed with tussionex.    Relevant Orders   Rapid strep screen (not at [facility])       Follow up plan: Return if symptoms worsen or fail to improve.

## 2015-10-10 LAB — CULTURE, GROUP A STREP: Strep A Culture: NEGATIVE

## 2015-10-10 LAB — RAPID STREP SCREEN (MED CTR MEBANE ONLY): Strep Gp A Ag, IA W/Reflex: NEGATIVE

## 2015-10-12 ENCOUNTER — Telehealth: Payer: Self-pay | Admitting: Family Medicine

## 2015-10-12 NOTE — Telephone Encounter (Signed)
Patient notified

## 2015-10-12 NOTE — Telephone Encounter (Signed)
Please call pt and let him know that his strep cx was negative so no need for abx. Thanks

## 2015-10-20 DIAGNOSIS — H4042X0 Glaucoma secondary to eye inflammation, left eye, stage unspecified: Secondary | ICD-10-CM | POA: Diagnosis not present

## 2015-10-27 ENCOUNTER — Encounter: Payer: 59 | Admitting: Family Medicine

## 2015-12-29 ENCOUNTER — Other Ambulatory Visit: Payer: Self-pay | Admitting: Family Medicine

## 2015-12-29 DIAGNOSIS — J029 Acute pharyngitis, unspecified: Secondary | ICD-10-CM

## 2015-12-29 DIAGNOSIS — K219 Gastro-esophageal reflux disease without esophagitis: Secondary | ICD-10-CM

## 2016-01-05 ENCOUNTER — Ambulatory Visit (INDEPENDENT_AMBULATORY_CARE_PROVIDER_SITE_OTHER): Payer: BLUE CROSS/BLUE SHIELD | Admitting: Unknown Physician Specialty

## 2016-01-05 ENCOUNTER — Encounter: Payer: Self-pay | Admitting: Unknown Physician Specialty

## 2016-01-05 VITALS — BP 171/83 | HR 93 | Temp 98.1°F | Ht 66.0 in | Wt 155.6 lb

## 2016-01-05 DIAGNOSIS — Z021 Encounter for pre-employment examination: Secondary | ICD-10-CM

## 2016-01-05 DIAGNOSIS — I10 Essential (primary) hypertension: Secondary | ICD-10-CM

## 2016-01-05 DIAGNOSIS — N529 Male erectile dysfunction, unspecified: Secondary | ICD-10-CM

## 2016-01-05 LAB — URINALYSIS, DIPSTICK ONLY
Bilirubin, UA: NEGATIVE
Glucose, UA: NEGATIVE
Ketones, UA: NEGATIVE
Leukocytes, UA: NEGATIVE
Nitrite, UA: NEGATIVE
Specific Gravity, UA: 1.025 (ref 1.005–1.030)
Urobilinogen, Ur: 2 mg/dL — ABNORMAL HIGH (ref 0.2–1.0)
pH, UA: 6 (ref 5.0–7.5)

## 2016-01-05 MED ORDER — AMLODIPINE BESYLATE 10 MG PO TABS: 10.0000 mg | ORAL_TABLET | Freq: Every day | ORAL | 0 refills | Status: DC

## 2016-01-05 MED ORDER — SILDENAFIL CITRATE 20 MG PO TABS: 20.0000 mg | ORAL_TABLET | Freq: Every day | ORAL | 0 refills | Status: DC | PRN

## 2016-01-05 MED ORDER — BENAZEPRIL HCL 40 MG PO TABS: 40.0000 mg | ORAL_TABLET | Freq: Every day | ORAL | 0 refills | Status: DC

## 2016-01-05 NOTE — Assessment & Plan Note (Addendum)
Very poor control off of BP meds.  Restart BP meds.

## 2016-01-05 NOTE — Assessment & Plan Note (Signed)
Refill Sildenafil  

## 2016-01-05 NOTE — Progress Notes (Signed)
BP (!) 171/83 (BP Location: Left Arm, Cuff Size: Normal)   Pulse 93   Temp 98.1 F (36.7 C)   Ht 5\' 6"  (1.676 m)   Wt 155 lb 9.6 oz (70.6 kg)   SpO2 97%   BMI 25.11 kg/m    Subjective:    Patient ID: Shaun Carney, male    DOB: [birth year], 55 y.o.   MRN: [id number]  HPI: Shaun Carney is a 55 y.o. male  Chief Complaint  Patient presents with  . DOT Physical   Pt is here for his DOT but BP is elevated and admits to only taking BP meds sometimes.  States he feels it gives him ED.  He was wondering if more BP meds plus viagra can be called in for him.  No chest pain or SOB.  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    Relevant past medical, surgical, family and social history reviewed and updated as indicated. Interim medical history since our last visit reviewed. Allergies and medications reviewed and updated.  Review of Systems  All other systems reviewed and are negative.   Per HPI unless specifically indicated above     Objective:    BP (!) 171/83 (BP Location: Left Arm, Cuff Size: Normal)   Pulse 93   Temp 98.1 F (36.7 C)   Ht 5\' 6"  (1.676 m)   Wt 155 lb 9.6 oz (70.6 kg)   SpO2 97%   BMI 25.11 kg/m   Wt Readings from Last 3 Encounters:  01/05/16 155 lb 9.6 oz (70.6 kg)  10/08/15 158 lb (71.7 kg)  06/10/15 155 lb (70.3 kg)    Physical Exam  Constitutional: He is oriented to person, place, and time. He appears well-developed and well-nourished. No distress.  HENT:  Head: Normocephalic and atraumatic.  Eyes: Conjunctivae and lids are normal. Right eye exhibits no discharge. Left eye exhibits no discharge. No scleral icterus.  Neck: Normal range of motion. Neck supple. No JVD present. Carotid bruit is not present.  Cardiovascular: Normal rate, regular rhythm and normal heart sounds.   Pulmonary/Chest: Effort normal and breath sounds normal. No respiratory distress.  Abdominal: Normal appearance. There is no splenomegaly or hepatomegaly.  Musculoskeletal:  Normal range of motion.  Neurological: He is alert and oriented to person, place, and time.  Skin: Skin is warm, dry and intact. No rash noted. No pallor.  Psychiatric: He has a normal mood and affect. His behavior is normal. Judgment and thought content normal.    Results for orders placed or performed in visit on 10/08/15  Rapid strep screen (not at [facility])  Result Value Ref Range   Strep Gp A Ag, IA W/Reflex Negative Negative  Culture, Group A Strep  Result Value Ref Range   Strep A Culture Negative       Assessment & Plan:   Problem List Items Addressed This Visit      Unprioritized   ED (erectile dysfunction)    Refill Sildenafil      Relevant Medications   sildenafil (REVATIO) 20 MG tablet   Essential hypertension    Very poor control off of BP meds.  Restart BP meds.        Relevant Medications   sildenafil (REVATIO) 20 MG tablet   amLODipine (NORVASC) 10 MG tablet   benazepril (LOTENSIN) 40 MG tablet    Other Visit Diagnoses    Pre-employment examination    -  Primary   Relevant Orders   Urinalysis, dipstick only  Will not do DOT today  Follow up plan: Return for DOT when BP better.

## 2016-01-13 ENCOUNTER — Ambulatory Visit (INDEPENDENT_AMBULATORY_CARE_PROVIDER_SITE_OTHER): Payer: BLUE CROSS/BLUE SHIELD | Admitting: Family Medicine

## 2016-01-13 ENCOUNTER — Encounter: Payer: Self-pay | Admitting: Family Medicine

## 2016-01-13 ENCOUNTER — Ambulatory Visit
Admission: RE | Admit: 2016-01-13 | Discharge: 2016-01-13 | Disposition: A | Discharge status: Home / Self Care | Payer: BLUE CROSS/BLUE SHIELD | Source: Ambulatory Visit | Attending: Family Medicine | Admitting: Family Medicine

## 2016-01-13 ENCOUNTER — Encounter: Payer: BLUE CROSS/BLUE SHIELD | Admitting: Unknown Physician Specialty

## 2016-01-13 VITALS — BP 117/67 | HR 81 | Temp 99.9°F | Wt 155.8 lb

## 2016-01-13 DIAGNOSIS — J189 Pneumonia, unspecified organism: Secondary | ICD-10-CM | POA: Insufficient documentation

## 2016-01-13 DIAGNOSIS — R509 Fever, unspecified: Secondary | ICD-10-CM

## 2016-01-13 DIAGNOSIS — I517 Cardiomegaly: Secondary | ICD-10-CM | POA: Diagnosis not present

## 2016-01-13 DIAGNOSIS — R197 Diarrhea, unspecified: Secondary | ICD-10-CM

## 2016-01-13 DIAGNOSIS — R05 Cough: Secondary | ICD-10-CM | POA: Diagnosis not present

## 2016-01-13 LAB — VERITOR FLU A/B WAIVED
Influenza A: NEGATIVE
Influenza B: NEGATIVE

## 2016-01-13 MED ORDER — BENZONATATE 100 MG PO CAPS: 100.0000 mg | ORAL_CAPSULE | Freq: Two times a day (BID) | ORAL | 0 refills | Status: DC | PRN

## 2016-01-13 MED ORDER — AMOXICILLIN-POT CLAVULANATE 875-125 MG PO TABS: 1.0000 | ORAL_TABLET | Freq: Two times a day (BID) | ORAL | 0 refills | Status: DC

## 2016-01-13 NOTE — Progress Notes (Signed)
BP 117/67 (BP Location: Left Arm, Patient Position: Sitting, Cuff Size: Normal)   Pulse 81   Temp 99.9 F (37.7 C) (Oral)   Wt 155 lb 12.8 oz (70.7 kg)   SpO2 94%   BMI 25.15 kg/m    Subjective:    Patient ID: Shaun Carney, male    DOB: 1960/04/22, 55 y.o.   MRN: [id number]  HPI: Shaun Carney is a 55 y.o. male  Chief Complaint  Patient presents with  . URI    cough,congestion, cold chills, diarrhea, and loss of appetite   Patient feeling really bad with a lot of cough congestion had some upset stomach some intermittent low-grade fevers and feeling very bad spent ongoing all week. Just getting worse over the last several days. Having some headache generalized achiness and fevers mostly just coughing with his left side hurting with coughing. Relevant past medical, surgical, family and social history reviewed and updated as indicated. Interim medical history since our last visit reviewed. Allergies and medications reviewed and updated.  Review of Systems  Constitutional: Positive for chills, diaphoresis, fatigue and fever.  HENT: Positive for rhinorrhea and sore throat.   Respiratory: Positive for cough, chest tightness and shortness of breath. Negative for wheezing and stridor.   Cardiovascular: Negative for palpitations and leg swelling.    Per HPI unless specifically indicated above     Objective:    BP 117/67 (BP Location: Left Arm, Patient Position: Sitting, Cuff Size: Normal)   Pulse 81   Temp 99.9 F (37.7 C) (Oral)   Wt 155 lb 12.8 oz (70.7 kg)   SpO2 94%   BMI 25.15 kg/m   Wt Readings from Last 3 Encounters:  01/13/16 155 lb 12.8 oz (70.7 kg)  01/05/16 155 lb 9.6 oz (70.6 kg)  10/08/15 158 lb (71.7 kg)    Physical Exam  Constitutional: He is oriented to person, place, and time. He appears well-developed and well-nourished. No distress.  HENT:  Head: Normocephalic and atraumatic.  Right Ear: Hearing normal.  Left Ear: Hearing normal.  Nose: Nose normal.   Eyes: Conjunctivae and lids are normal. Right eye exhibits no discharge. Left eye exhibits no discharge. No scleral icterus.  Cardiovascular: Normal rate, regular rhythm and normal heart sounds.   Pulmonary/Chest: Effort normal. No respiratory distress.  Left lower lobe with rales  Musculoskeletal: Normal range of motion.  Neurological: He is alert and oriented to person, place, and time.  Skin: Skin is intact. No rash noted.  Psychiatric: He has a normal mood and affect. His speech is normal and behavior is normal. Judgment and thought content normal. Cognition and memory are normal.    Results for orders placed or performed in visit on 01/13/16  Veritor Flu A/B Waived  Result Value Ref Range   Influenza A Negative Negative   Influenza B Negative Negative      Assessment & Plan:   Problem List Items Addressed This Visit      Respiratory   Bilateral pneumonia    Patient's chest x-ray showing bilateral pneumonia with recommendation to repeat chest x-ray in 3-4 weeks to assess clearing area and discuss these reports with patient and he will follow-up for chest x-ray. Will start Augmentin 875 twice a day patient education given on upset stomach especially if patient's already upset stomach. Tessalon Perles. Patient had on worsening going to the emergency room for admission. Over-the-counter Tylenol Mucinex etc.      Relevant Medications   amoxicillin-clavulanate (AUGMENTIN) 875-125 MG tablet  benzonatate (TESSALON) 100 MG capsule   Other Relevant Orders   DG Chest 2 View    Other Visit Diagnoses    Fever, unspecified fever cause    -  Primary   Relevant Orders   Veritor Flu A/B Waived (Completed)   DG Chest 2 View (Completed)   Diarrhea, unspecified type           Follow up plan: Return for As scheduled.

## 2016-01-13 NOTE — Assessment & Plan Note (Signed)
Patient's chest x-ray showing bilateral pneumonia with recommendation to repeat chest x-ray in 3-4 weeks to assess clearing area and discuss these reports with patient and he will follow-up for chest x-ray. Will start Augmentin 875 twice a day patient education given on upset stomach especially if patient's already upset stomach. Tessalon Perles. Patient had on worsening going to the emergency room for admission. Over-the-counter Tylenol Mucinex etc.

## 2016-01-19 ENCOUNTER — Telehealth: Payer: Self-pay | Admitting: Family Medicine

## 2016-01-19 NOTE — Telephone Encounter (Signed)
Pt would like a call back regarding his return to work after his diagnosis with pneumonia on 12/20

## 2016-01-20 NOTE — Telephone Encounter (Signed)
Patient said he was out of work all week last week and he went back to work yesterday. He feels better and he's on an antibiotic. Just notifying. Patient stated Dr. Jeananne Rama said not to get another X-Ray till January.   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   I explained to patient Malachy Mood wasn't here till Friday and she was completely booked.  Patient asked for me to send a message to see Malachy Mood to see what to do next because it has be done by the 31st.   I explained that I would send a message to [place] and her CMA.

## 2016-01-22 ENCOUNTER — Ambulatory Visit (INDEPENDENT_AMBULATORY_CARE_PROVIDER_SITE_OTHER): Payer: Self-pay | Admitting: Unknown Physician Specialty

## 2016-01-22 ENCOUNTER — Encounter: Payer: BLUE CROSS/BLUE SHIELD | Admitting: Unknown Physician Specialty

## 2016-01-22 ENCOUNTER — Encounter: Payer: Self-pay | Admitting: Unknown Physician Specialty

## 2016-01-22 DIAGNOSIS — N529 Male erectile dysfunction, unspecified: Secondary | ICD-10-CM

## 2016-01-22 MED ORDER — SILDENAFIL CITRATE 20 MG PO TABS: 20.0000 mg | ORAL_TABLET | Freq: Every day | ORAL | 0 refills | Status: DC | PRN

## 2016-01-22 NOTE — Telephone Encounter (Signed)
Patient came in for appt.

## 2016-01-22 NOTE — Telephone Encounter (Signed)
Spoke with [employer]. Malachy Mood said she wanted to him this afternoon, because it expires the 31st.  Malachy Mood stated she wanted to work with him because he did get pneumonia.  Called and spoke with patient and asked if he could come in today. He said yes but it would be about 3-3:30. Malachy Mood and front office notified.

## 2016-01-22 NOTE — Progress Notes (Signed)
   BP (!) 176/88   Pulse 84   Temp 98.1 F (36.7 C)   Wt 152 lb (68.9 kg)   SpO2 99%   BMI 24.53 kg/m    Subjective:    Patient ID: Shaun Carney, male    DOB: 1960-11-24, 55 y.o.   MRN: [id number]  HPI: Shaun Carney is a 55 y.o. male  Chief Complaint  Patient presents with  . DOT Physical    Relevant past medical, surgical, family and social history reviewed and updated as indicated. Interim medical history since our last visit reviewed. Allergies and medications reviewed and updated.  Review of Systems  Per HPI unless specifically indicated above     Objective:    BP (!) 176/88   Pulse 84   Temp 98.1 F (36.7 C)   Wt 152 lb (68.9 kg)   SpO2 99%   BMI 24.53 kg/m   Wt Readings from Last 3 Encounters:  01/22/16 152 lb (68.9 kg)  01/13/16 155 lb 12.8 oz (70.7 kg)  01/05/16 155 lb 9.6 oz (70.6 kg)    Physical Exam  Constitutional: He is oriented to person, place, and time. He appears well-developed and well-nourished. No distress.  HENT:  Head: Normocephalic and atraumatic.  Eyes: Conjunctivae and lids are normal. Right eye exhibits no discharge. Left eye exhibits no discharge. No scleral icterus.  Neck: Normal range of motion. Neck supple. No JVD present. Carotid bruit is not present.  Cardiovascular: Normal rate, regular rhythm and normal heart sounds.   Pulmonary/Chest: Effort normal and breath sounds normal. No respiratory distress.  Abdominal: Normal appearance. There is no splenomegaly or hepatomegaly.  Musculoskeletal: Normal range of motion.  Neurological: He is alert and oriented to person, place, and time.  Skin: Skin is warm, dry and intact. No rash noted. No pallor.  Psychiatric: He has a normal mood and affect. His behavior is normal. Judgment and thought content normal.    Results for orders placed or performed in visit on 01/13/16  Veritor Flu A/B Waived  Result Value Ref Range   Influenza A Negative Negative   Influenza B Negative Negative       Assessment & Plan:   Problem List Items Addressed This Visit    None       Follow up plan: No Follow-up on file.  DOT physical.  See form

## 2016-01-22 NOTE — Telephone Encounter (Signed)
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

## 2016-01-26 DIAGNOSIS — H4042X0 Glaucoma secondary to eye inflammation, left eye, stage unspecified: Secondary | ICD-10-CM | POA: Diagnosis not present

## 2016-04-18 ENCOUNTER — Emergency Department
Admission: EM | Admit: 2016-04-18 | Discharge: 2016-04-18 | Disposition: A | Discharge status: Home / Self Care | Payer: Worker's Compensation | Attending: Student in an Organized Health Care Education/Training Program | Admitting: Student in an Organized Health Care Education/Training Program

## 2016-04-18 ENCOUNTER — Emergency Department: Payer: Worker's Compensation

## 2016-04-18 ENCOUNTER — Encounter: Payer: Self-pay | Admitting: Emergency Medicine

## 2016-04-18 DIAGNOSIS — S39012A Strain of muscle, fascia and tendon of lower back, initial encounter: Secondary | ICD-10-CM | POA: Diagnosis not present

## 2016-04-18 DIAGNOSIS — S3992XA Unspecified injury of lower back, initial encounter: Secondary | ICD-10-CM | POA: Diagnosis present

## 2016-04-18 DIAGNOSIS — Y999 Unspecified external cause status: Secondary | ICD-10-CM | POA: Diagnosis not present

## 2016-04-18 DIAGNOSIS — Y939 Activity, unspecified: Secondary | ICD-10-CM | POA: Diagnosis not present

## 2016-04-18 DIAGNOSIS — X500XXA Overexertion from strenuous movement or load, initial encounter: Secondary | ICD-10-CM | POA: Insufficient documentation

## 2016-04-18 DIAGNOSIS — Y929 Unspecified place or not applicable: Secondary | ICD-10-CM | POA: Diagnosis not present

## 2016-04-18 DIAGNOSIS — M25551 Pain in right hip: Secondary | ICD-10-CM | POA: Diagnosis not present

## 2016-04-18 LAB — URINALYSIS, COMPLETE (UACMP) WITH MICROSCOPIC
Bacteria, UA: NONE SEEN
Bilirubin Urine: NEGATIVE
Glucose, UA: NEGATIVE mg/dL
Hgb urine dipstick: NEGATIVE
Ketones, ur: NEGATIVE mg/dL
Leukocytes, UA: NEGATIVE
Nitrite: NEGATIVE
Protein, ur: NEGATIVE mg/dL
Specific Gravity, Urine: 1.026 (ref 1.005–1.030)
Squamous Epithelial / LPF: NONE SEEN
WBC, UA: NONE SEEN WBC/hpf (ref 0–5)
pH: 5 (ref 5.0–8.0)

## 2016-04-18 MED ORDER — TRAMADOL HCL 50 MG PO TABS: 50.0000 mg | ORAL_TABLET | Freq: Four times a day (QID) | ORAL | 0 refills | Status: DC | PRN

## 2016-04-18 MED ORDER — NAPROXEN 500 MG PO TABS
500.0000 mg | ORAL_TABLET | Freq: Once | ORAL | Status: AC
  Administered 2016-04-18: 500 mg via ORAL
  Filled 2016-04-18: qty 1

## 2016-04-18 MED ORDER — NAPROXEN 500 MG PO TABS: 500.0000 mg | ORAL_TABLET | Freq: Two times a day (BID) | ORAL | 0 refills | Status: DC

## 2016-04-18 MED ORDER — CYCLOBENZAPRINE HCL 10 MG PO TABS: 10.0000 mg | ORAL_TABLET | Freq: Three times a day (TID) | ORAL | 0 refills | Status: DC | PRN

## 2016-04-18 NOTE — ED Provider Notes (Signed)
[facility] Emergency Department Provider Note    First MD Initiated Contact with Patient 04/18/16 [phone]     (approximate)  I have reviewed the triage vital signs and the nursing notes.   HISTORY  Chief Complaint Back Pain    HPI Shaun Carney is a 56 y.o. male chief complaint of right-sided back pain that started on Friday after the patient was lifting a very heavy territory works. He is a Designer, industrial/product taking residents to appointments. Abdomen morbidly obese patient that he was lifting on Friday. States he was lifting the patient up felt a popping sensation in his had severe pain in the right low back since then. No measured fevers. No numbness or tingling. Denies any bowel or bladder incontinence.   Past Medical History:  Diagnosis Date  . GERD (gastroesophageal reflux disease)    Family History  Problem Relation Age of Onset  . Cancer Mother     throat  . Hypertension Mother   . Heart attack Mother   . Cancer Father     lung   Past Surgical History:  Procedure Laterality Date  . EYE SURGERY    . INSERTION OF AHMED VALVE Left 05/18/2015   Procedure: INSERTION OF AHMED VALVE AND SCLERAL PATCH GRAFT TO LEFT EYE;  Surgeon: Ronnell Freshwater, MD;  Location: [place];  Service: Ophthalmology;  Laterality: Left;  1ST CASE PER DR VIN  . NO PAST SURGERIES     Patient Active Problem List   Diagnosis Date Noted  . Bilateral pneumonia 01/13/2016  . ED (erectile dysfunction) 06/10/2015  . Essential hypertension 05/13/2015      Prior to Admission medications   Medication Sig Start Date End Date Taking? Authorizing Provider  amLODipine (NORVASC) 10 MG tablet Take 1 tablet (10 mg total) by mouth daily. 01/05/16   Kathrine Haddock, NP  amoxicillin-clavulanate (AUGMENTIN) 875-125 MG tablet Take 1 tablet by mouth 2 (two) times daily. 01/13/16   Guadalupe Maple, MD  benazepril (LOTENSIN) 40 MG tablet Take 1 tablet (40 mg total) by  mouth daily. 01/05/16   Kathrine Haddock, NP  benzonatate (TESSALON) 100 MG capsule Take 1 capsule (100 mg total) by mouth 2 (two) times daily as needed for cough. 01/13/16   Guadalupe Maple, MD  cyclobenzaprine (FLEXERIL) 10 MG tablet Take 1 tablet (10 mg total) by mouth 3 (three) times daily as needed for muscle spasms. 04/18/16   Merlyn Lot, MD  DUREZOL 0.05 % EMUL  04/28/15   Historical Provider, MD  fluticasone (FLONASE) 50 MCG/ACT nasal spray SHAKE LIQUID WELL ANDINSTILL 2 SPRAYS IN EACH NOSTRIL EVERY DAY 12/29/15   Guadalupe Maple, MD  naproxen (NAPROSYN) 500 MG tablet Take 1 tablet (500 mg total) by mouth 2 (two) times daily with a meal. 04/18/16 04/18/17  Merlyn Lot, MD  pantoprazole (PROTONIX) 40 MG tablet TAKE 1 TABLET BY MOUTH EVERY DAY 12/29/15   Guadalupe Maple, MD  sildenafil (REVATIO) 20 MG tablet Take 1 tablet (20 mg total) by mouth daily as needed. 01/22/16   Kathrine Haddock, NP  traMADol (ULTRAM) 50 MG tablet Take 1 tablet (50 mg total) by mouth every 6 (six) hours as needed. 04/18/16 04/18/17  Merlyn Lot, MD    Allergies Patient has no known allergies.    Social History Social History  Substance Use Topics  . Smoking status: Never Smoker  . Smokeless tobacco: Never Used  . Alcohol use 3.6 oz/week    6 Cans of beer  per week    Review of Systems Patient denies headaches, rhinorrhea, blurry vision, numbness, shortness of breath, chest pain, edema, cough, abdominal pain, nausea, vomiting, diarrhea, dysuria, fevers, rashes or hallucinations unless otherwise stated above in HPI. ____________________________________________   PHYSICAL EXAM:  VITAL SIGNS: Vitals:   04/18/16 0622  BP: (!) 148/89  Pulse: 72  Resp: 17  Temp: 98.2 F (36.8 C)    Constitutional: Alert and oriented. Well appearing and in no acute distress. Eyes: Conjunctivae are normal. PERRL. EOMI. Head: Atraumatic. Nose: No congestion/rhinnorhea. Mouth/Throat: Mucous membranes are moist.   Oropharynx non-erythematous. Neck: No stridor. Painless ROM. No cervical spine tenderness to palpation Hematological/Lymphatic/Immunilogical: No cervical lymphadenopathy. Cardiovascular: Normal rate, regular rhythm. Grossly normal heart sounds.  Good peripheral circulation. Respiratory: Normal respiratory effort.  No retractions. Lungs CTAB. Gastrointestinal: Soft and nontender. No distention. No abdominal bruits. No CVA tenderness. Musculoskeletal: No lower extremity tenderness nor edema.  No joint effusions.  DTRs 2+ and equal bilaterally.  TTP along right iliac crest and right paralumbar muscles.  No midline ttp. Neurologic:  Normal speech and language. No gross focal neurologic deficits are appreciated. No gait instability. Skin:  Skin is warm, dry and intact. No rash noted. Psychiatric: Mood and affect are normal. Speech and behavior are normal.  ____________________________________________   LABS (all labs ordered are listed, but only abnormal results are displayed)  No results found for this or any previous visit (from the past 24 hour(s)). ____________________________________________ ____________________________________________  [id number]  I personally reviewed all radiographic images ordered to evaluate for the above acute complaints and reviewed radiology reports and findings.  These findings were personally discussed with the patient.  Please see medical record for radiology report.  ____________________________________________   PROCEDURES  Procedure(s) performed:  Procedures    Critical Care performed: no ____________________________________________   INITIAL IMPRESSION / ASSESSMENT AND PLAN / ED COURSE  Pertinent labs & imaging results that were available during my care of the patient were reviewed by me and considered in my medical decision making (see chart for details).  DDX: contusion, lumbago, fracture, strain, stone  Shaun Carney is a 56 y.o. who  presents to the ED with No history of injury or trauma. No recent back instrumentation/procedures. No fevers. Denies cord compression symptoms. No bowel/bladder incontinence or retention, no LE weakness. VSS in ED. Exam with no LE weakness bilat., no sensory deficits, normal DTRs, no clonus, no saddle anesthesia. Pain with palpation of back and with trunk movement. Likely MSK related pain, probable lumbar strain.  History and physical exam less consistent with kidney stone or pyelonephritis. Treatments will include observation, analgesia, and arrange appropriate follow up for recheck.  Clinical picture is not consistent with epidural abscess , fracture, or cauda equina syndrome. Plan supportive care, follow up for recheck       ____________________________________________   FINAL CLINICAL IMPRESSION(S) / ED DIAGNOSES  Final diagnoses:  Strain of lumbar region, initial encounter      NEW MEDICATIONS STARTED DURING THIS VISIT:  New Prescriptions   CYCLOBENZAPRINE (FLEXERIL) 10 MG TABLET    Take 1 tablet (10 mg total) by mouth 3 (three) times daily as needed for muscle spasms.   NAPROXEN (NAPROSYN) 500 MG TABLET    Take 1 tablet (500 mg total) by mouth 2 (two) times daily with a meal.   TRAMADOL (ULTRAM) 50 MG TABLET    Take 1 tablet (50 mg total) by mouth every 6 (six) hours as needed.     Note:  This document  was prepared using Systems analyst and may include unintentional dictation errors.    Merlyn Lot, MD 04/18/16 [phone]

## 2016-04-18 NOTE — ED Notes (Signed)
Patient told me he hurt his back about 5 years ago and intermittently has sporadic pain with it.  This weekend he says he has been taking Tylenol at home with no relief.  He is seeking tx for pain and inquired about possibly getting an x-ray.  I told him he can mention that to the MD for consideration.  He also told me he does not have any pain during urination despite the triage note about that.

## 2016-04-18 NOTE — Discharge Instructions (Signed)

## 2016-04-18 NOTE — ED Triage Notes (Signed)
PT presenting for back pain that is similar to pain that he had when he injured his back 6 years ago. 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  Denies N/V/D. Ambulatory to triage and NAD at this time.

## 2016-05-11 ENCOUNTER — Ambulatory Visit: Payer: BLUE CROSS/BLUE SHIELD | Admitting: Family Medicine

## 2016-05-13 ENCOUNTER — Encounter: Payer: Self-pay | Admitting: Family Medicine

## 2016-05-13 ENCOUNTER — Ambulatory Visit (INDEPENDENT_AMBULATORY_CARE_PROVIDER_SITE_OTHER): Payer: BLUE CROSS/BLUE SHIELD | Admitting: Family Medicine

## 2016-05-13 VITALS — BP 144/69 | HR 75 | Temp 98.3°F | Ht 67.0 in | Wt 160.2 lb

## 2016-05-13 DIAGNOSIS — R21 Rash and other nonspecific skin eruption: Secondary | ICD-10-CM

## 2016-05-13 MED ORDER — CLOBETASOL PROP EMOLLIENT BASE 0.05 % EX CREA: 1.0000 "application " | TOPICAL_CREAM | Freq: Two times a day (BID) | CUTANEOUS | 1 refills | Status: DC

## 2016-05-13 NOTE — Patient Instructions (Signed)
Follow up as needed

## 2016-05-13 NOTE — Progress Notes (Signed)
   BP (!) 144/69   Pulse 75   Temp 98.3 F (36.8 C)   Ht 5\' 7"  (1.702 m)   Wt 160 lb 3.2 oz (72.7 kg)   SpO2 98%   BMI 25.09 kg/m    Subjective:    Patient ID: Shaun Carney, male    DOB: 1960-11-01, 56 y.o.   MRN: [id number]  HPI: Shaun Carney is a 56 y.o. male  Chief Complaint  Patient presents with  . Rash   Patient presents with 2 itchy red places on his right mid-back that he first noticed 2 days ago. When taking benadryl, the places go away but benadryl makes pt groggy so he doesn't like to take it. This has happened to him twice before over the years, always during a life stressor (death in family). Is currently having stress over a grandchild situation. No hx of food or skin allergies, no new foods, meds, or products recently.   Relevant past medical, surgical, family and social history reviewed and updated as indicated. Interim medical history since our last visit reviewed. Allergies and medications reviewed and updated.  Review of Systems  Constitutional: Negative.   HENT: Negative.   Respiratory: Negative.   Cardiovascular: Negative.   Gastrointestinal: Negative.   Genitourinary: Negative.   Musculoskeletal: Negative.   Skin: Positive for rash.  Neurological: Negative.   Psychiatric/Behavioral: Negative.     Per HPI unless specifically indicated above     Objective:    BP (!) 144/69   Pulse 75   Temp 98.3 F (36.8 C)   Ht 5\' 7"  (1.702 m)   Wt 160 lb 3.2 oz (72.7 kg)   SpO2 98%   BMI 25.09 kg/m   Wt Readings from Last 3 Encounters:  05/13/16 160 lb 3.2 oz (72.7 kg)  04/18/16 158 lb (71.7 kg)  01/22/16 152 lb (68.9 kg)    Physical Exam  Constitutional: He is oriented to person, place, and time. He appears well-developed and well-nourished. No distress.  HENT:  Head: Atraumatic.  Eyes: Conjunctivae are normal. Pupils are equal, round, and reactive to light.  Neck: Normal range of motion. Neck supple.  Cardiovascular: Normal rate and normal heart  sounds.   Pulmonary/Chest: Effort normal. No respiratory distress.  Musculoskeletal: Normal range of motion.  Neurological: He is alert and oriented to person, place, and time.  Skin: Skin is warm and dry. Rash (2 isolated inflamed erythematous papules on right mid-back) noted.  Psychiatric: He has a normal mood and affect. His behavior is normal.  Nursing note and vitals reviewed.     Assessment & Plan:   Problem List Items Addressed This Visit    None    Visit Diagnoses    Rash    -  Primary   Suspect hives, clobetasol cream given. Discussed continuing benadryl prn. F/u if worsening or no improvement       Follow up plan: Return if symptoms worsen or fail to improve.

## 2016-06-06 ENCOUNTER — Other Ambulatory Visit: Payer: Self-pay | Admitting: Unknown Physician Specialty

## 2016-06-06 DIAGNOSIS — N529 Male erectile dysfunction, unspecified: Secondary | ICD-10-CM

## 2016-08-02 DIAGNOSIS — H4042X0 Glaucoma secondary to eye inflammation, left eye, stage unspecified: Secondary | ICD-10-CM | POA: Diagnosis not present

## 2016-08-16 ENCOUNTER — Other Ambulatory Visit: Payer: Self-pay | Admitting: Family Medicine

## 2016-08-16 ENCOUNTER — Other Ambulatory Visit: Payer: Self-pay | Admitting: Unknown Physician Specialty

## 2016-08-16 DIAGNOSIS — I10 Essential (primary) hypertension: Secondary | ICD-10-CM

## 2016-08-16 NOTE — Telephone Encounter (Signed)
Apt PE 

## 2016-08-16 NOTE — Telephone Encounter (Signed)
Last OV2: 05/13/16 Next OV: 01/22/16  BMP Latest Ref Rng & Units 06/10/2015 01/11/2013  Glucose 65 - 99 mg/dL 87 106(H)  BUN 6 - 24 mg/dL 17 21(H)  Creatinine 0.76 - 1.27 mg/dL 0.94 1.04  BUN/Creat Ratio 9 - 20 18 -  Sodium 134 - 144 mmol/L 145(H) 137  Potassium 3.5 - 5.2 mmol/L 3.8 3.7  Chloride 96 - 106 mmol/L 102 105  CO2 18 - 29 mmol/L 26 29  Calcium 8.7 - 10.2 mg/dL 9.7 8.8

## 2016-09-19 DIAGNOSIS — H25012 Cortical age-related cataract, left eye: Secondary | ICD-10-CM | POA: Diagnosis not present

## 2016-09-21 ENCOUNTER — Ambulatory Visit (INDEPENDENT_AMBULATORY_CARE_PROVIDER_SITE_OTHER): Payer: BLUE CROSS/BLUE SHIELD | Admitting: Family Medicine

## 2016-09-21 ENCOUNTER — Encounter: Payer: Self-pay | Admitting: Family Medicine

## 2016-09-21 DIAGNOSIS — I10 Essential (primary) hypertension: Secondary | ICD-10-CM | POA: Diagnosis not present

## 2016-09-21 MED ORDER — AMLODIPINE BESYLATE 10 MG PO TABS: 10.0000 mg | ORAL_TABLET | Freq: Every day | ORAL | 1 refills | Status: DC

## 2016-09-21 MED ORDER — ALBUTEROL SULFATE HFA 108 (90 BASE) MCG/ACT IN AERS: 2.0000 | INHALATION_SPRAY | Freq: Four times a day (QID) | RESPIRATORY_TRACT | 0 refills | Status: DC | PRN

## 2016-09-21 NOTE — Progress Notes (Signed)
BP (!) 162/84   Pulse 75   Wt 162 lb (73.5 kg)   SpO2 99%   BMI 25.37 kg/m    Subjective:    Patient ID: Shaun Carney, male    DOB: 1960-12-11, 57 y.o.   MRN: [id number]  HPI: Tavarious Freel is a 56 y.o. male  Chief Complaint  Patient presents with  . Hypertension  Patient presents today for surgical clearance for cataract surgery coming up 9/6. BPs were significantly elevated at pre-op appt so was asked to get clearance form signed by PCP. Has been off his amlodipine for about 3 weeks because he ran out of refills. Still taking benazepril. Denies CP, SOB, palpitations. Compliant with medications when he has them, and states BP was under great control prior to running out.   Relevant past medical, surgical, family and social history reviewed and updated as indicated. Interim medical history since our last visit reviewed. Allergies and medications reviewed and updated.  Review of Systems  Constitutional: Negative.   HENT: Negative.   Respiratory: Negative.   Cardiovascular: Negative.   Gastrointestinal: Negative.   Musculoskeletal: Negative.   Neurological: Negative.   Psychiatric/Behavioral: Negative.    Per HPI unless specifically indicated above     Objective:    BP (!) 162/84   Pulse 75   Wt 162 lb (73.5 kg)   SpO2 99%   BMI 25.37 kg/m   Wt Readings from Last 3 Encounters:  09/21/16 162 lb (73.5 kg)  05/13/16 160 lb 3.2 oz (72.7 kg)  04/18/16 158 lb (71.7 kg)    Physical Exam  Constitutional: He is oriented to person, place, and time. He appears well-developed and well-nourished. No distress.  HENT:  Head: Atraumatic.  Eyes: Pupils are equal, round, and reactive to light. Conjunctivae are normal. No scleral icterus.  Neck: Normal range of motion. Neck supple.  Cardiovascular: Normal rate and normal heart sounds.   Pulmonary/Chest: Effort normal and breath sounds normal. No respiratory distress.  Musculoskeletal: Normal range of motion.  Neurological: He is  alert and oriented to person, place, and time.  Skin: Skin is warm and dry.  Psychiatric: He has a normal mood and affect. His behavior is normal.  Nursing note and vitals reviewed.  Results for orders placed or performed during the hospital encounter of 04/18/16  Urinalysis, Complete w Microscopic  Result Value Ref Range   Color, Urine YELLOW (A) YELLOW   APPearance CLEAR (A) CLEAR   Specific Gravity, Urine 1.026 1.005 - 1.030   pH 5.0 5.0 - 8.0   Glucose, UA NEGATIVE NEGATIVE mg/dL   Hgb urine dipstick NEGATIVE NEGATIVE   Bilirubin Urine NEGATIVE NEGATIVE   Ketones, ur NEGATIVE NEGATIVE mg/dL   Protein, ur NEGATIVE NEGATIVE mg/dL   Nitrite NEGATIVE NEGATIVE   Leukocytes, UA NEGATIVE NEGATIVE   RBC / HPF 0-5 0 - 5 RBC/hpf   WBC, UA NONE SEEN 0 - 5 WBC/hpf   Bacteria, UA NONE SEEN NONE SEEN   Squamous Epithelial / LPF NONE SEEN NONE SEEN   Mucus PRESENT       Assessment & Plan:   Problem List Items Addressed This Visit      Cardiovascular and Mediastinum   Essential hypertension    Discussed with patient that BP was not at goal in order for clearance to be granted. WIll restart amlodipine and bring him back in a few days to see if it's back WNL at which time form will be completed and sent. Pt agreeable to  plan      Relevant Medications   amLODipine (NORVASC) 10 MG tablet       Follow up plan: Return in about 2 days (around 09/23/2016) for BP recheck.

## 2016-09-22 NOTE — Assessment & Plan Note (Signed)
Discussed with patient that BP was not at goal in order for clearance to be granted. WIll restart amlodipine and bring him back in a few days to see if it's back WNL at which time form will be completed and sent. Pt agreeable to plan

## 2016-09-22 NOTE — Patient Instructions (Signed)
Followup in a few days. 

## 2016-09-23 ENCOUNTER — Ambulatory Visit (INDEPENDENT_AMBULATORY_CARE_PROVIDER_SITE_OTHER): Payer: BLUE CROSS/BLUE SHIELD | Admitting: Family Medicine

## 2016-09-23 VITALS — BP 168/94 | HR 67 | Ht 67.0 in | Wt 160.0 lb

## 2016-09-23 DIAGNOSIS — I10 Essential (primary) hypertension: Secondary | ICD-10-CM

## 2016-09-23 NOTE — Progress Notes (Signed)
   BP (!) 168/94   Pulse 67   Ht 5\' 7"  (1.702 m)   Wt 160 lb (72.6 kg)   SpO2 99%   BMI 25.06 kg/m    Subjective:    Patient ID: Shaun Carney, male    DOB: 1960/09/20, 56 y.o.   MRN: [id number]  HPI: Shaun Carney is a 56 y.o. male  Chief Complaint  Patient presents with  . Blood Pressure Check   Started back on his amlodipine yesterday. Home readings have been in the high 160s/90s. Needing surgical clearance for cataract surgery.   Relevant past medical, surgical, family and social history reviewed and updated as indicated. Interim medical history since our last visit reviewed. Allergies and medications reviewed and updated.  Review of Systems  Per HPI unless specifically indicated above     Objective:    BP (!) 168/94   Pulse 67   Ht 5\' 7"  (1.702 m)   Wt 160 lb (72.6 kg)   SpO2 99%   BMI 25.06 kg/m   Wt Readings from Last 3 Encounters:  09/23/16 160 lb (72.6 kg)  09/21/16 162 lb (73.5 kg)  05/13/16 160 lb 3.2 oz (72.7 kg)    Physical Exam  Results for orders placed or performed during the hospital encounter of 04/18/16  Urinalysis, Complete w Microscopic  Result Value Ref Range   Color, Urine YELLOW (A) YELLOW   APPearance CLEAR (A) CLEAR   Specific Gravity, Urine 1.026 1.005 - 1.030   pH 5.0 5.0 - 8.0   Glucose, UA NEGATIVE NEGATIVE mg/dL   Hgb urine dipstick NEGATIVE NEGATIVE   Bilirubin Urine NEGATIVE NEGATIVE   Ketones, ur NEGATIVE NEGATIVE mg/dL   Protein, ur NEGATIVE NEGATIVE mg/dL   Nitrite NEGATIVE NEGATIVE   Leukocytes, UA NEGATIVE NEGATIVE   RBC / HPF 0-5 0 - 5 RBC/hpf   WBC, UA NONE SEEN 0 - 5 WBC/hpf   Bacteria, UA NONE SEEN NONE SEEN   Squamous Epithelial / LPF NONE SEEN NONE SEEN   Mucus PRESENT       Assessment & Plan:   Problem List Items Addressed This Visit      Cardiovascular and Mediastinum   Essential hypertension - Primary    Discussed with patient that he needs to be on the medicine a bit longer and make sure home readings  are within range. Will see him back early next week to see if any improvement and no charge visit as he did not reach goal          Follow up plan: Return in about 4 days (around 09/27/2016) for BP check.

## 2016-09-26 NOTE — Patient Instructions (Signed)
Follow up in a few days for BP check

## 2016-09-26 NOTE — Assessment & Plan Note (Signed)
Discussed with patient that he needs to be on the medicine a bit longer and make sure home readings are within range. Will see him back early next week to see if any improvement and no charge visit as he did not reach goal

## 2016-09-27 ENCOUNTER — Encounter: Payer: Self-pay | Admitting: *Deleted

## 2016-09-28 NOTE — Pre-Procedure Instructions (Signed)
SEE NOTE ON CHART. BP AT EYE CENTER PREOP ELEVATED. HE CONTACTED PCP AND ONE BP MED RESTARTED. BP AFTER 3 DAYS 160/89.

## 2016-09-29 ENCOUNTER — Ambulatory Visit: Payer: BLUE CROSS/BLUE SHIELD | Admitting: Anesthesiology

## 2016-09-29 ENCOUNTER — Ambulatory Visit
Admission: RE | Admit: 2016-09-29 | Discharge: 2016-09-29 | Disposition: A | Discharge status: Home / Self Care | Payer: BLUE CROSS/BLUE SHIELD | Source: Ambulatory Visit | Attending: Ophthalmology | Admitting: Ophthalmology

## 2016-09-29 ENCOUNTER — Encounter
Admission: RE | Disposition: A | Discharge status: Home / Self Care | Payer: Self-pay | Source: Ambulatory Visit | Attending: Ophthalmology

## 2016-09-29 ENCOUNTER — Encounter: Payer: Self-pay | Admitting: *Deleted

## 2016-09-29 DIAGNOSIS — H2512 Age-related nuclear cataract, left eye: Secondary | ICD-10-CM | POA: Diagnosis not present

## 2016-09-29 DIAGNOSIS — F419 Anxiety disorder, unspecified: Secondary | ICD-10-CM | POA: Insufficient documentation

## 2016-09-29 DIAGNOSIS — Z79899 Other long term (current) drug therapy: Secondary | ICD-10-CM | POA: Diagnosis not present

## 2016-09-29 DIAGNOSIS — K219 Gastro-esophageal reflux disease without esophagitis: Secondary | ICD-10-CM | POA: Insufficient documentation

## 2016-09-29 DIAGNOSIS — I10 Essential (primary) hypertension: Secondary | ICD-10-CM | POA: Diagnosis not present

## 2016-09-29 HISTORY — DX: Anxiety disorder, unspecified: F41.9

## 2016-09-29 HISTORY — PX: CATARACT EXTRACTION W/PHACO: SHX586

## 2016-09-29 SURGERY — PHACOEMULSIFICATION, CATARACT, WITH IOL INSERTION
Anesthesia: Monitor Anesthesia Care | Site: Eye | Laterality: Left | Wound class: Clean

## 2016-09-29 MED ORDER — FENTANYL CITRATE (PF) 100 MCG/2ML IJ SOLN
INTRAMUSCULAR | Status: AC
  Filled 2016-09-29: qty 2

## 2016-09-29 MED ORDER — SODIUM CHLORIDE 0.9 % IV SOLN
INTRAVENOUS | Status: DC
  Administered 2016-09-29 (×2): via INTRAVENOUS

## 2016-09-29 MED ORDER — TRYPAN BLUE 0.06 % OP SOLN
OPHTHALMIC | Status: DC | PRN
Start: 2016-09-29 — End: 2016-09-29
  Administered 2016-09-29: 0.5 mL via INTRAOCULAR

## 2016-09-29 MED ORDER — MOXIFLOXACIN HCL 0.5 % OP SOLN
OPHTHALMIC | Status: DC | PRN
  Administered 2016-09-29: 0.2 mL via OPHTHALMIC

## 2016-09-29 MED ORDER — NA CHONDROIT SULF-NA HYALURON 40-30 MG/ML IO SOLN
INTRAOCULAR | Status: DC | PRN
  Administered 2016-09-29: 1 mL via INTRAOCULAR

## 2016-09-29 MED ORDER — EPINEPHRINE PF 1 MG/ML IJ SOLN
INTRAMUSCULAR | Status: AC
  Filled 2016-09-29: qty 1

## 2016-09-29 MED ORDER — BSS IO SOLN
INTRAOCULAR | Status: DC | PRN
  Administered 2016-09-29: 200 mL via INTRAOCULAR

## 2016-09-29 MED ORDER — ARMC OPHTHALMIC DILATING DROPS
1.0000 "application " | OPHTHALMIC | Status: AC
  Administered 2016-09-29 (×3): 1 via OPHTHALMIC

## 2016-09-29 MED ORDER — MIDAZOLAM HCL 5 MG/5ML IJ SOLN
INTRAMUSCULAR | Status: DC | PRN
  Administered 2016-09-29: 2 mg via INTRAVENOUS

## 2016-09-29 MED ORDER — LIDOCAINE HCL (PF) 4 % IJ SOLN
INTRAOCULAR | Status: DC | PRN
  Administered 2016-09-29: 4 mL via OPHTHALMIC

## 2016-09-29 MED ORDER — FENTANYL CITRATE (PF) 100 MCG/2ML IJ SOLN
INTRAMUSCULAR | Status: DC | PRN
  Administered 2016-09-29 (×2): 25 ug via INTRAVENOUS

## 2016-09-29 MED ORDER — POVIDONE-IODINE 5 % OP SOLN
OPHTHALMIC | Status: DC | PRN
  Administered 2016-09-29: 1 via OPHTHALMIC

## 2016-09-29 MED ORDER — ARMC OPHTHALMIC DILATING DROPS
OPHTHALMIC | Status: AC
  Administered 2016-09-29: 1 via OPHTHALMIC
  Filled 2016-09-29: qty 0.4

## 2016-09-29 MED ORDER — MOXIFLOXACIN HCL 0.5 % OP SOLN
OPHTHALMIC | Status: AC
  Filled 2016-09-29: qty 3

## 2016-09-29 MED ORDER — CARBACHOL 0.01 % IO SOLN
INTRAOCULAR | Status: DC | PRN
  Administered 2016-09-29: 0.5 mL via INTRAOCULAR

## 2016-09-29 MED ORDER — SODIUM HYALURONATE 10 MG/ML IO SOLN
INTRAOCULAR | Status: DC | PRN
  Administered 2016-09-29: 0.55 mL via INTRAOCULAR

## 2016-09-29 MED ORDER — NA CHONDROIT SULF-NA HYALURON 40-17 MG/ML IO SOLN
INTRAOCULAR | Status: AC
  Filled 2016-09-29: qty 1

## 2016-09-29 MED ORDER — LIDOCAINE HCL (PF) 4 % IJ SOLN
INTRAMUSCULAR | Status: AC
  Filled 2016-09-29: qty 5

## 2016-09-29 MED ORDER — MOXIFLOXACIN HCL 0.5 % OP SOLN: 1.0000 [drp] | OPHTHALMIC | Status: DC | PRN

## 2016-09-29 MED ORDER — POVIDONE-IODINE 5 % OP SOLN
OPHTHALMIC | Status: AC
  Filled 2016-09-29: qty 30

## 2016-09-29 MED ORDER — TRYPAN BLUE 0.06 % OP SOLN
OPHTHALMIC | Status: AC
  Filled 2016-09-29: qty 0.5

## 2016-09-29 MED ORDER — MIDAZOLAM HCL 2 MG/2ML IJ SOLN
INTRAMUSCULAR | Status: AC
  Filled 2016-09-29: qty 2

## 2016-09-29 SURGICAL SUPPLY — 18 items
DISSECTOR HYDRO NUCLEUS 50X22 (MISCELLANEOUS) ×2 IMPLANT
GLOVE BIO SURGEON STRL SZ8 (GLOVE) ×2 IMPLANT
GLOVE BIOGEL M 6.5 STRL (GLOVE) ×2 IMPLANT
GLOVE SURG LX 7.5 STRW (GLOVE) ×1
GLOVE SURG LX STRL 7.5 STRW (GLOVE) ×1 IMPLANT
GOWN STRL REUS W/ TWL LRG LVL3 (GOWN DISPOSABLE) ×2 IMPLANT
GOWN STRL REUS W/TWL LRG LVL3 (GOWN DISPOSABLE) ×2
LABEL CATARACT MEDS ST (LABEL) ×2 IMPLANT
LENS IOL TECNIS ITEC 21.5 (Intraocular Lens) ×2 IMPLANT
PACK CATARACT (MISCELLANEOUS) ×2 IMPLANT
PACK CATARACT KING (MISCELLANEOUS) ×2 IMPLANT
PACK EYE AFTER SURG (MISCELLANEOUS) ×2 IMPLANT
SOL BSS BAG (MISCELLANEOUS) ×2
SOLUTION BSS BAG (MISCELLANEOUS) ×1 IMPLANT
SUT ETHILON 10 0 CS140 6 (SUTURE) ×2 IMPLANT
SYR 5ML LL (SYRINGE) ×2 IMPLANT
WATER STERILE IRR 250ML POUR (IV SOLUTION) ×2 IMPLANT
WIPE NON LINTING 3.25X3.25 (MISCELLANEOUS) ×2 IMPLANT

## 2016-09-29 NOTE — Discharge Instructions (Signed)
FOLLOW DR. Melony Overly POSTOP EYE DROP INSTRUCTION SHEET AS REVIEWED.  Eye Surgery Discharge Instructions  Expect mild scratchy sensation or mild soreness. DO NOT RUB YOUR EYE!  The day of surgery:  Minimal physical activity, but bed rest is not required  No reading, computer work, or close hand work  No bending, lifting, or straining.  May watch TV  For 24 hours:  No driving, legal decisions, or alcoholic beverages  Safety precautions  Eat anything you prefer: It is better to start with liquids, then soup then solid foods.  _____ Eye patch should be worn until postoperative exam tomorrow.  ____ Solar shield eyeglasses should be worn for comfort in the sunlight/patch while sleeping  Resume all regular medications including aspirin or Coumadin if these were discontinued prior to surgery. You may shower, bathe, shave, or wash your hair. Tylenol may be taken for mild discomfort.  Call your doctor if you experience significant pain, nausea, or vomiting, fever > 101 or other signs of infection. [phone] or [phone] Specific instructions:  Follow-up Information    Eulogio Bear, MD Follow up.   Specialty:  Ophthalmology Why:  09/30/16 @ 10:30 am Contact information: [address] [place] [address] [phone]

## 2016-09-29 NOTE — Anesthesia Postprocedure Evaluation (Signed)
Anesthesia Post Note  Patient: Shaun Carney  Procedure(s) Performed: Procedure(s) (LRB): CATARACT EXTRACTION PHACO AND INTRAOCULAR LENS PLACEMENT (IOC) (Left)  Patient location during evaluation: PACU Anesthesia Type: MAC Level of consciousness: awake Pain management: pain level controlled Vital Signs Assessment: post-procedure vital signs reviewed and stable Respiratory status: spontaneous breathing Cardiovascular status: blood pressure returned to baseline Postop Assessment: no headache, no backache and no signs of nausea or vomiting Anesthetic complications: no     Last Vitals:  Vitals:   09/29/16 0637 09/29/16 0900  BP: (!) 160/85 139/74  Pulse: 74 81  Resp: 16 12  Temp: 36.6 C (!) 36.3 C  SpO2: 99% 100%    Last Pain:  Vitals:   09/29/16 0900  TempSrc: Temporal                 Shaun Carney

## 2016-09-29 NOTE — H&P (Signed)
The History and Physical notes are on paper, have been signed, and are to be scanned.   I have examined the patient and there are no changes to the H&P.   Consent form should be for cataract and IOL placement left eye with possible Ahmed tube trimming.   Shaun Carney 09/29/2016 7:13 AM

## 2016-09-29 NOTE — Anesthesia Preprocedure Evaluation (Addendum)
Anesthesia Evaluation  Patient identified by MRN, date of birth, ID band Patient awake    Reviewed: Allergy & Precautions, NPO status , Patient's Chart, lab work & pertinent test results, reviewed documented beta blocker date and time   Airway Mallampati: II  TM Distance: >3 FB     Dental  (+) Chipped, Poor Dentition, Missing, Dental Advisory Given   Pulmonary pneumonia, resolved,           Cardiovascular hypertension, Pt. on medications      Neuro/Psych Anxiety    GI/Hepatic GERD  Controlled,  Endo/Other    Renal/GU      Musculoskeletal   Abdominal   Peds  Hematology   Anesthesia Other Findings Hypertensive this am.  Reproductive/Obstetrics                            Anesthesia Physical Anesthesia Plan  ASA: II  Anesthesia Plan: MAC   Post-op Pain Management:    Induction:   PONV Risk Score and Plan:   Airway Management Planned:   Additional Equipment:   Intra-op Plan:   Post-operative Plan:   Informed Consent: I have reviewed the patients History and Physical, chart, labs and discussed the procedure including the risks, benefits and alternatives for the proposed anesthesia with the patient or authorized representative who has indicated his/her understanding and acceptance.     Plan Discussed with: CRNA  Anesthesia Plan Comments:         Anesthesia Quick Evaluation

## 2016-09-29 NOTE — Anesthesia Post-op Follow-up Note (Signed)
Anesthesia QCDR form completed.        

## 2016-09-29 NOTE — Transfer of Care (Signed)
Immediate Anesthesia Transfer of Care Note  Patient: [name]  Procedure(s) Performed: Procedure(s) with comments: CATARACT EXTRACTION PHACO AND INTRAOCULAR LENS PLACEMENT (IOC) (Left) - Lot # [id number] H US:00:40.4 AP%: 7.0 CDE: 2.87  Patient Location: PACU  Anesthesia Type:General  Level of Consciousness: awake, alert  and oriented  Airway & Oxygen Therapy: Patient Spontanous Breathing  Post-op Assessment: Report given to RN and Post -op Vital signs reviewed and stable  Post vital signs: Reviewed and stable  Last Vitals:  Vitals:   09/29/16 0637 09/29/16 0900  BP: (!) 160/85 139/74  Pulse: 74 81  Resp: 16 12  Temp: 36.6 C (!) 36.3 C  SpO2: 99% 100%    Last Pain:  Vitals:   09/29/16 0900  TempSrc: Temporal         Complications: No apparent anesthesia complications

## 2016-09-29 NOTE — Op Note (Signed)
OPERATIVE NOTE  Shaun Carney [id number] 09/29/2016   PREOPERATIVE DIAGNOSIS:  Nuclear sclerotic cataract left eye.  H25.12   POSTOPERATIVE DIAGNOSIS:    Nuclear sclerotic cataract left eye.     PROCEDURE:  Phacoemusification with posterior chamber intraocular lens placement of the left eye   LENS:   Implant Name Type Inv. Item Serial No. Manufacturer Lot No. LRB No. Used  LENS IOL DIOP 21.5 - [id number] Intraocular Lens LENS IOL DIOP 21.5 [id number] AMO   Left 1       PCB00 +21.5   ULTRASOUND TIME: 0 minutes 40 seconds.  CDE 2.87   SURGEON:  Benay Pillow, MD, MPH   ANESTHESIA:  Topical with tetracaine drops augmented with 1% preservative-free intracameral lidocaine.  ESTIMATED BLOOD LOSS: <1 mL   COMPLICATIONS:  None.   DESCRIPTION OF PROCEDURE:  The patient was identified in the holding room and transported to the operating room and placed in the supine position under the operating microscope.  The left eye was identified as the operative eye and it was prepped and draped in the usual sterile ophthalmic fashion.   A 1.0 millimeter clear-corneal paracentesis was made at the 6:00 position.  0.5 ml of preservative-free 1% lidocaine with epinephrine was injected into the anterior chamber.  There was a poor red reflex, so Trypan blue was instilled under an air bubble and rinsed out with BSS to stain the capsule for improved visualization.  The anterior chamber was filled with Discovisc.  A 2.4 millimeter keratome was used to make a near-clear corneal incision at the 3:00 position.    There was an Ahmed tube was present at 2:00. A curvilinear capsulorrhexis was made with a cystotome and capsulorrhexis forceps.  Initially there was difficulty in getting the forceps to open to grasp the flap.  The keratome was used to slightly enlarge the incision, then the forceps worked well.  During the rhexis creation, a small anterior rhexis tear was created at 2:00 near the tube which extended  to the periphery, but this did not wrap around to the posterior capsule.    Balanced salt solution was used to gently hydrodissect the lens.   Phacoemulsification was then used in stop and chop fashion to remove the lens nucleus and epinucleus.  The lens was relatively soft.  The remaining cortex was then removed using the irrigation and aspiration handpiece. Healon was then placed into the capsular bag to distend it for lens placement.  A lens was then injected into the capsular bag.  The remaining viscoelastic was irrigated with the BSS on a cannula.  A 10-0 nylon suture was placed through the temporal wound and the knot buried.   Wounds were hydrated with balanced salt solution.  The anterior chamber was inflated to a physiologic pressure with balanced salt solution.   Intracameral vigamox 0.1 mL undiltued was injected into the eye and a drop placed onto the ocular surface.  No wound leaks were noted.  The patient was taken to the recovery room in stable condition without complications of anesthesia or surgery  Benay Pillow 09/29/2016, 8:59 AM

## 2017-01-04 ENCOUNTER — Other Ambulatory Visit: Payer: Self-pay | Admitting: Family Medicine

## 2017-01-04 DIAGNOSIS — I10 Essential (primary) hypertension: Secondary | ICD-10-CM

## 2017-01-12 ENCOUNTER — Encounter: Payer: BLUE CROSS/BLUE SHIELD | Admitting: Family Medicine

## 2017-01-18 ENCOUNTER — Ambulatory Visit (INDEPENDENT_AMBULATORY_CARE_PROVIDER_SITE_OTHER): Payer: Self-pay | Admitting: Unknown Physician Specialty

## 2017-01-18 ENCOUNTER — Encounter: Payer: Self-pay | Admitting: Unknown Physician Specialty

## 2017-01-18 VITALS — BP 174/82 | HR 86 | Ht 67.0 in | Wt 163.0 lb

## 2017-01-18 DIAGNOSIS — I10 Essential (primary) hypertension: Secondary | ICD-10-CM

## 2017-01-18 DIAGNOSIS — M2240 Chondromalacia patellae, unspecified knee: Secondary | ICD-10-CM | POA: Insufficient documentation

## 2017-01-18 DIAGNOSIS — Z024 Encounter for examination for driving license: Secondary | ICD-10-CM

## 2017-01-18 MED ORDER — HYDROCHLOROTHIAZIDE 25 MG PO TABS: 25.0000 mg | ORAL_TABLET | Freq: Every day | ORAL | 3 refills | Status: DC

## 2017-01-18 NOTE — Assessment & Plan Note (Signed)
Add HCTZ 25 mg to current prescriptions of Lotensin and Amlodipine

## 2017-01-18 NOTE — Progress Notes (Signed)
   BP (!) 174/82   Pulse 86   Ht 5\' 7"  (1.702 m)   Wt 163 lb (73.9 kg)   SpO2 99%   BMI 25.53 kg/m    Subjective:    Patient ID: Shaun Carney, male    DOB: [birth year], 56 y.o.   MRN: [id number]  HPI: Shaun Carney is a 56 y.o. male  Chief Complaint  Patient presents with  . DOT   Pt is here for DOT physical.  He has struggled with high blood pressure for the last year or two.   Relevant past medical, surgical, family and social history reviewed and updated as indicated.  He takes Amlodipine and Lotensin every AM.    He would like to continue with his physical but aware only good for 3 months.     Interim medical history since our last visit reviewed. Allergies and medications reviewed and updated.  Review of Systems  Constitutional: Negative.   HENT: Negative.   Eyes: Negative.   Respiratory: Negative.   Cardiovascular: Negative.   Gastrointestinal: Negative.   Endocrine: Negative.   Genitourinary: Negative.   Skin: Negative.   Allergic/Immunologic: Negative.   Neurological: Negative.   Hematological: Negative.   Psychiatric/Behavioral: Negative.     Per HPI unless specifically indicated above     Objective:    BP (!) 174/82   Pulse 86   Ht 5\' 7"  (1.702 m)   Wt 163 lb (73.9 kg)   SpO2 99%   BMI 25.53 kg/m   Wt Readings from Last 3 Encounters:  01/18/17 163 lb (73.9 kg)  09/29/16 160 lb (72.6 kg)  09/23/16 160 lb (72.6 kg)    Physical Exam  Constitutional: He is oriented to person, place, and time. He appears well-developed and well-nourished. No distress.  HENT:  Head: Normocephalic and atraumatic.  Eyes: Conjunctivae and lids are normal. Right eye exhibits no discharge. Left eye exhibits no discharge. No scleral icterus.  Neck: Normal range of motion. Neck supple. No JVD present. Carotid bruit is not present.  Cardiovascular: Normal rate, regular rhythm and normal heart sounds.  Pulmonary/Chest: Effort normal and breath sounds normal. No respiratory  distress.  Abdominal: Normal appearance. There is no splenomegaly or hepatomegaly.  Musculoskeletal: Normal range of motion.  Neurological: He is alert and oriented to person, place, and time.  Skin: Skin is warm, dry and intact. No rash noted. No pallor.  Psychiatric: He has a normal mood and affect. His behavior is normal. Judgment and thought content normal.      Assessment & Plan:   Problem List Items Addressed This Visit      Unprioritized   Essential hypertension    Add HCTZ 25 mg to current prescriptions of Lotensin and Amlodipine      Relevant Medications   hydrochlorothiazide (HYDRODIURIL) 25 MG tablet    Other Visit Diagnoses    Encounter for CDL (commercial driving license) exam    -  Primary      DOT - See form  Follow up plan: Return in about 4 weeks (around 02/15/2017).

## 2017-01-30 ENCOUNTER — Other Ambulatory Visit: Payer: Self-pay | Admitting: Unknown Physician Specialty

## 2017-01-30 DIAGNOSIS — N529 Male erectile dysfunction, unspecified: Secondary | ICD-10-CM

## 2017-02-15 ENCOUNTER — Other Ambulatory Visit: Payer: Self-pay | Admitting: Family Medicine

## 2017-02-15 DIAGNOSIS — K219 Gastro-esophageal reflux disease without esophagitis: Secondary | ICD-10-CM

## 2017-02-21 ENCOUNTER — Encounter: Payer: BLUE CROSS/BLUE SHIELD | Admitting: Unknown Physician Specialty

## 2017-02-21 ENCOUNTER — Encounter: Payer: Self-pay | Admitting: Unknown Physician Specialty

## 2017-03-14 ENCOUNTER — Telehealth: Payer: Self-pay | Admitting: Family Medicine

## 2017-03-14 MED ORDER — ALPRAZOLAM 1 MG PO TABS: 1.0000 mg | ORAL_TABLET | Freq: Every day | ORAL | 0 refills | Status: DC | PRN

## 2017-03-14 NOTE — Telephone Encounter (Signed)
Pt. Reports he is having dental work and needs Xanax for these procedures. States Kathrine Haddock prescribes this for him when he has procedures like this. See attached. Please advise pt.Thanks.

## 2017-03-14 NOTE — Telephone Encounter (Signed)
printed

## 2017-03-14 NOTE — Telephone Encounter (Signed)
Copied from [place] [phone]. Topic: Quick Communication - See Telephone Encounter >> Mar 14, 2017  9:51 AM Hewitt Shorts wrote: CRM for notification. See Telephone encounter for:  Pt is having a tooth pulled on Saturday and cleaning next week and is needing xanax  at least 3 for this jprocedure could this please be called into [facility] [place] number [phone] 03/14/17.

## 2017-03-14 NOTE — Telephone Encounter (Signed)
Not sure if Dr Jeananne Rama or Malachy Mood would get this message.  Thanks

## 2017-03-15 NOTE — Telephone Encounter (Signed)
Faxed today

## 2017-03-15 NOTE — Telephone Encounter (Addendum)
Relation to pt: self Call back [facility]: [place], [place] [phone] (Phone) [phone] (Fax     Reason for call:  Patient checking on the status of message below, please advise

## 2017-03-16 NOTE — Telephone Encounter (Signed)
ALPRAZolam (XANAX) 1 MG tablet [[id number]]    Needs to be sent to the [facility], pt states the pharmacy hasnt received the medication, contact pt or pharmacy to advise

## 2017-03-16 NOTE — Telephone Encounter (Signed)
refaxed script to [facility] fax [phone]

## 2017-04-06 ENCOUNTER — Other Ambulatory Visit: Payer: Self-pay | Admitting: Family Medicine

## 2017-04-06 DIAGNOSIS — K219 Gastro-esophageal reflux disease without esophagitis: Secondary | ICD-10-CM

## 2017-04-06 DIAGNOSIS — J029 Acute pharyngitis, unspecified: Secondary | ICD-10-CM

## 2017-04-06 NOTE — Telephone Encounter (Signed)
LOV 08/ 29/ 18 with Merrie Roof ,01-18-17 with Kathrine Haddock / Refill request for flonase and protonix / Refill per protocol /

## 2017-05-08 ENCOUNTER — Other Ambulatory Visit: Payer: Self-pay | Admitting: Family Medicine

## 2017-05-08 DIAGNOSIS — K219 Gastro-esophageal reflux disease without esophagitis: Secondary | ICD-10-CM

## 2017-05-08 DIAGNOSIS — N529 Male erectile dysfunction, unspecified: Secondary | ICD-10-CM

## 2017-05-08 NOTE — Telephone Encounter (Signed)
Sildenafil and protonix refill requests  LOV 01/22/16 with Dr. Jeananne Rama where these meds were addressed.  Had 2 DOT physicals since.  [place], [place] [address] [employer].

## 2017-05-23 ENCOUNTER — Ambulatory Visit: Payer: Self-pay | Admitting: *Deleted

## 2017-05-23 NOTE — Telephone Encounter (Signed)
Patient is calling to report that he feels full and bloated every time he eats or drinks. He gets full too quickly.Patient states he has regular bowels and reports no other symptoms. He states he has tried Tums- but that didn't help.  Reason for Disposition . [1] MODERATE pain (e.g., interferes with normal activities) AND [2] comes and goes (cramps) AND [3] present > 24 hours  (Exception: pain with Vomiting or Diarrhea - see that Guideline)  Answer Assessment - Initial Assessment Questions 1. LOCATION: "Where does it hurt?"      Upper abdomen- when patient eats/drinks 2. RADIATION: "Does the pain shoot anywhere else?" (e.g., chest, back)     No radiation 3. ONSET: "When did the pain begin?" (e.g., minutes, hours or days ago)      1 week 4. SUDDEN: "Gradual or sudden onset?"     gradual 5. PATTERN "Does the pain come and go, or is it constant?"    - If constant: "Is it getting better, staying the same, or worsening?"      (Note: Constant means the pain never goes away completely; most serious pain is constant and it progresses)     - If intermittent: "How long does it last?" "Do you have pain now?"     (Note: Intermittent means the pain goes away completely between bouts)     Comes and goes- with eating 6. SEVERITY: "How bad is the pain?"  (e.g., Scale 1-10; mild, moderate, or severe)    - MILD (1-3): doesn't interfere with normal activities, abdomen soft and not tender to touch     - MODERATE (4-7): interferes with normal activities or awakens from sleep, tender to touch     - SEVERE (8-10): excruciating pain, doubled over, unable to do any normal activities       Moderate- patient states he is functioning 7. RECURRENT SYMPTOM: "Have you ever had this type of abdominal pain before?" If so, ask: "When was the last time?" and "What happened that time?"      Patient thinks he had this in this past- long time ago- medication may helped- he can't remember 8. AGGRAVATING FACTORS: "Does anything  seem to cause this pain?" (e.g., foods, stress, alcohol)     Eating makes him feel full , liquid also does it too  9. CARDIAC SYMPTOMS: "Do you have any of the following symptoms: chest pain, difficulty breathing, sweating, nausea?"     no 10. OTHER SYMPTOMS: "Do you have any other symptoms?" (e.g., fever, vomiting, diarrhea)       no 11. PREGNANCY: "Is there any chance you are pregnant?" "When was your last menstrual period?"       n/a  Protocols used: ABDOMINAL PAIN - UPPER-A-AH

## 2017-05-24 ENCOUNTER — Ambulatory Visit: Payer: BLUE CROSS/BLUE SHIELD | Admitting: Family Medicine

## 2017-05-24 VITALS — BP 150/94 | HR 93 | Temp 98.4°F | Wt 163.0 lb

## 2017-05-24 DIAGNOSIS — R1013 Epigastric pain: Secondary | ICD-10-CM | POA: Diagnosis not present

## 2017-05-24 DIAGNOSIS — R109 Unspecified abdominal pain: Secondary | ICD-10-CM | POA: Diagnosis not present

## 2017-05-24 MED ORDER — SUCRALFATE 1 G PO TABS: 1.0000 g | ORAL_TABLET | Freq: Three times a day (TID) | ORAL | 0 refills | Status: DC

## 2017-05-24 NOTE — Progress Notes (Signed)
   BP (!) 150/94 (BP Location: Right Arm, Patient Position: Sitting)   Pulse 93   Temp 98.4 F (36.9 C) (Oral)   Wt 163 lb (73.9 kg)   BMI 25.53 kg/m    Subjective:    Patient ID: [name], male    DOB: [birth year], 57 y.o.   MRN: [id number]  HPI: Shaun Carney is a 57 y.o. male  Chief Complaint  Patient presents with  . Abdominal Pain    upper abdominal pain x 1 week    Pt here today with epigastric distention and discomfort x 2 weeks. Maybe a bit worse after eating. States there's a palpable area in his upper abdomen that has him concerned. Also c/o early satiety since onset. No hx of hernia, hx of abdominal surgeries,  changes to diet, recent travel, new medications, recent trauma. Takes protonix daily which doesn't seem to help. Denies fevers, chills, N/V/D, dark stools, constipation, weight loss.   Relevant past medical, surgical, family and social history reviewed and updated as indicated. Interim medical history since our last visit reviewed. Allergies and medications reviewed and updated.  Review of Systems  Per HPI unless specifically indicated above     Objective:    BP (!) 150/94 (BP Location: Right Arm, Patient Position: Sitting)   Pulse 93   Temp 98.4 F (36.9 C) (Oral)   Wt 163 lb (73.9 kg)   BMI 25.53 kg/m   Wt Readings from Last 3 Encounters:  05/24/17 163 lb (73.9 kg)  02/21/17 160 lb 14.4 oz (73 kg)  01/18/17 163 lb (73.9 kg)    Physical Exam  Constitutional: He is oriented to person, place, and time. He appears well-developed and well-nourished. No distress.  HENT:  Head: Atraumatic.  Eyes: Pupils are equal, round, and reactive to light. EOM are normal.  Cardiovascular: Normal rate, regular rhythm and normal heart sounds.  Pulmonary/Chest: Effort normal and breath sounds normal.  Abdominal: Soft. Normal appearance and bowel sounds are normal. He exhibits mass (palpable circular mass in epigastric area where pt is symptomatic, noticable when  standing upright). There is tenderness (ttp in epigastric area).  Genitourinary: Rectum normal.  Neurological: He is alert and oriented to person, place, and time.  Skin: Skin is warm and dry.  Psychiatric: He has a normal mood and affect. His behavior is normal.  Nursing note and vitals reviewed.   Results for orders placed or performed in visit on 84/16/60  Helicobacter pylori abs-IgG+IgA, bld  Result Value Ref Range   H. pylori, IgA Abs <9.0 0.0 - 8.9 units   H. pylori, IgG AbS <0.80 0.00 - 0.79 Index Value      Assessment & Plan:   Problem List Items Addressed This Visit    None    Visit Diagnoses    Epigastric pain    -  Primary   Relevant Orders   US Abdomen Limited   Helicobacter pylori abs-IgG+IgA, bld (Completed)    Will r/o H pylori, already taking protonix and no new risk factors for gastric ulcers. Unclear if this area of distention is superficial from hernia or other wall defect, lipoma, etc or if severe gastritis vs organ pathology. WIll obtain abdominal ultrasounds and continue from there. Start carafate in meantime for symptomatic treatment.    Follow up plan: Return for as scheduled.

## 2017-05-26 ENCOUNTER — Other Ambulatory Visit: Payer: Self-pay | Admitting: Family Medicine

## 2017-05-26 DIAGNOSIS — R140 Abdominal distension (gaseous): Secondary | ICD-10-CM

## 2017-05-26 LAB — HELICOBACTER PYLORI ABS-IGG+IGA, BLD
H. pylori, IgA Abs: <9 units (ref 0.0–8.9)
H. pylori, IgG AbS: <0.8 Index Value (ref 0.00–0.79)

## 2017-05-27 NOTE — Patient Instructions (Signed)
Follow up as scheduled.  

## 2017-05-30 ENCOUNTER — Other Ambulatory Visit: Payer: Self-pay | Admitting: Family Medicine

## 2017-05-30 DIAGNOSIS — R140 Abdominal distension (gaseous): Secondary | ICD-10-CM

## 2017-05-30 DIAGNOSIS — R1013 Epigastric pain: Secondary | ICD-10-CM

## 2017-05-30 NOTE — Progress Notes (Signed)
Us abd

## 2017-06-01 ENCOUNTER — Other Ambulatory Visit: Payer: Self-pay | Admitting: Family Medicine

## 2017-06-01 ENCOUNTER — Ambulatory Visit
Admission: RE | Admit: 2017-06-01 | Discharge: 2017-06-01 | Disposition: A | Discharge status: Home / Self Care | Payer: BLUE CROSS/BLUE SHIELD | Source: Ambulatory Visit | Attending: Family Medicine | Admitting: Family Medicine

## 2017-06-01 DIAGNOSIS — K828 Other specified diseases of gallbladder: Secondary | ICD-10-CM | POA: Insufficient documentation

## 2017-06-01 DIAGNOSIS — I10 Essential (primary) hypertension: Secondary | ICD-10-CM

## 2017-06-01 DIAGNOSIS — R1013 Epigastric pain: Secondary | ICD-10-CM | POA: Insufficient documentation

## 2017-06-01 DIAGNOSIS — R140 Abdominal distension (gaseous): Secondary | ICD-10-CM | POA: Insufficient documentation

## 2017-06-01 DIAGNOSIS — K219 Gastro-esophageal reflux disease without esophagitis: Secondary | ICD-10-CM

## 2017-06-01 DIAGNOSIS — K838 Other specified diseases of biliary tract: Secondary | ICD-10-CM | POA: Diagnosis not present

## 2017-06-01 DIAGNOSIS — R1900 Intra-abdominal and pelvic swelling, mass and lump, unspecified site: Secondary | ICD-10-CM

## 2017-06-01 MED ORDER — PANTOPRAZOLE SODIUM 40 MG PO TBEC: 40.0000 mg | DELAYED_RELEASE_TABLET | Freq: Every day | ORAL | 10 refills | Status: DC

## 2017-06-01 MED ORDER — BENAZEPRIL HCL 40 MG PO TABS: 40.0000 mg | ORAL_TABLET | Freq: Every day | ORAL | 2 refills | Status: DC

## 2017-06-01 MED ORDER — AMLODIPINE BESYLATE 10 MG PO TABS: 10.0000 mg | ORAL_TABLET | Freq: Every day | ORAL | 2 refills | Status: DC

## 2017-06-01 NOTE — Progress Notes (Signed)
Phone call Discussed with patient still having abdominal swelling epigastric worsening and discomfort after eating.  Has bulging that develops. Reviewed ultrasound showing some gallbladder sludge wondering if having some gallbladder dysfunction will set up for abdominal CT scan.

## 2017-06-14 ENCOUNTER — Ambulatory Visit
Admission: RE | Admit: 2017-06-14 | Discharge: 2017-06-14 | Disposition: A | Discharge status: Home / Self Care | Payer: BLUE CROSS/BLUE SHIELD | Source: Ambulatory Visit | Attending: Family Medicine | Admitting: Family Medicine

## 2017-06-14 DIAGNOSIS — R1900 Intra-abdominal and pelvic swelling, mass and lump, unspecified site: Secondary | ICD-10-CM | POA: Diagnosis not present

## 2017-06-14 DIAGNOSIS — R140 Abdominal distension (gaseous): Secondary | ICD-10-CM | POA: Diagnosis not present

## 2017-06-14 MED ORDER — IOPAMIDOL (ISOVUE-300) INJECTION 61%
100.0000 mL | Freq: Once | INTRAVENOUS | Status: AC | PRN
  Administered 2017-06-14: 100 mL via INTRAVENOUS

## 2017-06-15 ENCOUNTER — Telehealth: Payer: Self-pay | Admitting: Family Medicine

## 2017-06-15 NOTE — Telephone Encounter (Signed)
Please let him know that his CT scan came back normal. Thanks! 

## 2017-06-15 NOTE — Telephone Encounter (Signed)
Spoke with patient and given result. Patient stated he was glad to hear his CT came back normal.

## 2017-07-03 NOTE — Progress Notes (Signed)
This encounter was created in error - please disregard.

## 2017-07-31 ENCOUNTER — Other Ambulatory Visit: Payer: Self-pay | Admitting: Family Medicine

## 2017-07-31 DIAGNOSIS — N529 Male erectile dysfunction, unspecified: Secondary | ICD-10-CM

## 2017-07-31 NOTE — Telephone Encounter (Signed)
LOV 01/18/17 Dr. Wynetta Emery Last refill 05/08/17  # 50 with 0 refill

## 2017-08-04 ENCOUNTER — Emergency Department
Admission: EM | Admit: 2017-08-04 | Discharge: 2017-08-04 | Disposition: A | Discharge status: Home / Self Care | Payer: BLUE CROSS/BLUE SHIELD | Attending: Emergency Medicine | Admitting: Emergency Medicine

## 2017-08-04 ENCOUNTER — Other Ambulatory Visit: Payer: Self-pay

## 2017-08-04 DIAGNOSIS — I10 Essential (primary) hypertension: Secondary | ICD-10-CM | POA: Diagnosis not present

## 2017-08-04 DIAGNOSIS — E876 Hypokalemia: Secondary | ICD-10-CM | POA: Insufficient documentation

## 2017-08-04 DIAGNOSIS — Z79899 Other long term (current) drug therapy: Secondary | ICD-10-CM | POA: Diagnosis not present

## 2017-08-04 DIAGNOSIS — R002 Palpitations: Secondary | ICD-10-CM | POA: Insufficient documentation

## 2017-08-04 LAB — CBC
HCT: 43.6 % (ref 40.0–52.0)
Hemoglobin: 14.7 g/dL (ref 13.0–18.0)
MCH: 29.9 pg (ref 26.0–34.0)
MCHC: 33.8 g/dL (ref 32.0–36.0)
MCV: 88.7 fL (ref 80.0–100.0)
Platelets: 298 10*3/uL (ref 150–440)
RBC: 4.91 MIL/uL (ref 4.40–5.90)
RDW: 12.9 % (ref 11.5–14.5)
WBC: 5.1 10*3/uL (ref 3.8–10.6)

## 2017-08-04 LAB — TROPONIN I: Troponin I: <0.03 ng/mL (ref <0.03)

## 2017-08-04 LAB — BASIC METABOLIC PANEL
Anion gap: 10 (ref 5–15)
BUN: 17 mg/dL (ref 6–20)
CO2: 28 mmol/L (ref 22–32)
Calcium: 9.2 mg/dL (ref 8.9–10.3)
Chloride: 100 mmol/L (ref 98–111)
Creatinine, Ser: 1.05 mg/dL (ref 0.61–1.24)
GFR calc Af Amer: >60 mL/min (ref >60)
GFR calc non Af Amer: >60 mL/min (ref >60)
Glucose, Bld: 104 mg/dL — ABNORMAL HIGH (ref 70–99)
Potassium: 3.3 mmol/L — ABNORMAL LOW (ref 3.5–5.1)
Sodium: 138 mmol/L (ref 135–145)

## 2017-08-04 LAB — FIBRIN DERIVATIVES D-DIMER (ARMC ONLY): Fibrin derivatives D-dimer (ARMC): 257.41 ng/mL (FEU) (ref 0.00–499.00)

## 2017-08-04 MED ORDER — POTASSIUM CHLORIDE 20 MEQ PO PACK
40.0000 meq | PACK | Freq: Once | ORAL | Status: AC
  Administered 2017-08-04: 40 meq via ORAL
  Filled 2017-08-04: qty 2

## 2017-08-04 NOTE — ED Provider Notes (Signed)
[facility] Emergency Department Provider Note   First MD Initiated Contact with Patient 08/04/17 0645     (approximate)  I have reviewed the triage vital signs and the nursing notes.   HISTORY  Chief Complaint Palpitations    HPI Shaun Carney is a 57 y.o. male presents to the emergency department with acute onset of palpitations on awakening this morning.  Patient denies any chest pain no shortness of breath or diaphoresis.  Patient denies any lower extremity pain or swelling.  Patient states that episode was brief and now completely resolved.   Past Medical History:  Diagnosis Date  . Anxiety   . GERD (gastroesophageal reflux disease)   . Hypertension     Patient Active Problem List   Diagnosis Date Noted  . Chondromalacia patellae 01/18/2017  . ED (erectile dysfunction) 06/10/2015  . Essential hypertension 05/13/2015    Past Surgical History:  Procedure Laterality Date  . CATARACT EXTRACTION W/PHACO Left 09/29/2016   Procedure: CATARACT EXTRACTION PHACO AND INTRAOCULAR LENS PLACEMENT (IOC);  Surgeon: Eulogio Bear, MD;  Location: [facility] ORS;  Service: Ophthalmology;  Laterality: Left;  Lot # [id number] H US:00:40.4 AP%: 7.0 CDE: 2.87  . EYE SURGERY    . INSERTION OF AHMED VALVE Left 05/18/2015   Procedure: INSERTION OF AHMED VALVE AND SCLERAL PATCH GRAFT TO LEFT EYE;  Surgeon: Ronnell Freshwater, MD;  Location: [place];  Service: Ophthalmology;  Laterality: Left;  1ST CASE PER DR VIN  . NO PAST SURGERIES      Prior to Admission medications   Medication Sig Start Date End Date Taking? Authorizing Provider  amLODipine (NORVASC) 10 MG tablet Take 1 tablet (10 mg total) by mouth daily. 06/01/17   Guadalupe Maple, MD  benazepril (LOTENSIN) 40 MG tablet Take 1 tablet (40 mg total) by mouth daily. 06/01/17   Guadalupe Maple, MD  Carboxymeth-Glycerin-Polysorb (REFRESH OPTIVE ADVANCED) 0.5-1-0.5 % SOLN Place 1 drop into the left eye as  needed (for dry eyes).    [provider]  cyclobenzaprine (FLEXERIL) 10 MG tablet Take 1 tablet (10 mg total) by mouth 3 (three) times daily as needed for muscle spasms. Patient not taking: Reported on 09/23/2016 04/18/16   Merlyn Lot, MD  fluticasone [facility]) 50 MCG/ACT nasal spray INSTILL 2 SPRAYS IN [facility] NOSTRIL EVERY DAY 04/06/17   Guadalupe Maple, MD  hydrochlorothiazide (HYDRODIURIL) 25 MG tablet Take 1 tablet (25 mg total) by mouth daily. 01/18/17   Kathrine Haddock, NP  pantoprazole (PROTONIX) 40 MG tablet Take 1 tablet (40 mg total) by mouth daily. 06/01/17   Guadalupe Maple, MD  sildenafil (REVATIO) 20 MG tablet TAKE 1 TABLET BY MOUTH EVERY DAY AS NEEDED 05/08/17   Johnson, Megan P, DO  sildenafil (REVATIO) 20 MG tablet TAKE 1 TABLET BY MOUTH EVERY DAY AS NEEDED 07/31/17   Guadalupe Maple, MD  sucralfate (CARAFATE) 1 g tablet Take 1 tablet (1 g total) by mouth 4 (four) times daily -  with meals and at bedtime. 05/24/17   Volney American, PA-C    Allergies No known drug allergies  Family History  Problem Relation Age of Onset  . Cancer Mother        throat  . Hypertension Mother   . Heart attack Mother   . Cancer Father        lung    Social History Social History   Tobacco Use  . Smoking status: Never Smoker  . Smokeless tobacco:  Never Used  Substance Use Topics  . Alcohol use: Yes    Alcohol/week: 3.6 oz    Types: 6 Cans of beer per week  . Drug use: No    Review of Systems Constitutional: No fever/chills Eyes: No visual changes. ENT: No sore throat. Cardiovascular: Denies chest pain.  Positive for palpitations. Respiratory: Denies shortness of breath. Gastrointestinal: No abdominal pain.  No nausea, no vomiting.  No diarrhea.  No constipation. Genitourinary: Negative for dysuria. Musculoskeletal: Negative for neck pain.  Negative for back pain. Integumentary: Negative for rash. Neurological: Negative for headaches, focal weakness or  numbness.   ____________________________________________   PHYSICAL EXAM:  VITAL SIGNS: ED Triage Vitals  Enc Vitals Group     BP 08/04/17 0415 (!) 174/107     Pulse Rate 08/04/17 0415 88     Resp 08/04/17 0415 19     Temp 08/04/17 0415 97.7 F (36.5 C)     Temp Source 08/04/17 0415 Oral     SpO2 08/04/17 0415 98 %     Weight 08/04/17 0416 72.6 kg (160 lb)     Height 08/04/17 0416 1.702 m (5\' 7" )     Head Circumference --      Peak Flow --      Pain Score 08/04/17 0416 0     Pain Loc --      Pain Edu? --      Excl. in [place]? --     Constitutional: Alert and oriented. Well appearing and in no acute distress. Eyes: Conjunctivae are normal.  Head: Atraumatic. Mouth/Throat: Mucous membranes are moist. Oropharynx non-erythematous. Neck: No stridor.   Cardiovascular: Normal rate, regular rhythm. Good peripheral circulation. Grossly normal heart sounds. Respiratory: Normal respiratory effort.  No retractions. Lungs CTAB. Gastrointestinal: Soft and nontender. No distention.  Musculoskeletal: No lower extremity tenderness nor edema. No gross deformities of extremities. Neurologic:  Normal speech and language. No gross focal neurologic deficits are appreciated.  Skin:  Skin is warm, dry and intact. No rash noted. Psychiatric: Mood and affect are normal. Speech and behavior are normal.  ____________________________________________   LABS (all labs ordered are listed, but only abnormal results are displayed)  Labs Reviewed  BASIC METABOLIC PANEL - Abnormal; Notable for the following components:      Result Value   Potassium 3.3 (*)    Glucose, Bld 104 (*)    All other components within normal limits  CBC  TROPONIN I  FIBRIN DERIVATIVES D-DIMER ([facility] ONLY)   ____________________________________________  EKG ED ECG REPORT I, [place] BROWN, the attending physician, personally viewed and interpreted this ECG.   Date: 08/04/2017  EKG Time: 4:14 a.m.  Rate: 79  Rhythm:  Normal sinus rhythm with a premature ventricular contraction.  Axis: Normal  Intervals: Normal  ST&T Change: None    Procedures   ____________________________________________   INITIAL IMPRESSION / ASSESSMENT AND PLAN / ED COURSE  As part of my medical decision making, I reviewed the following data within the electronic MEDICAL RECORD NUMBER   57 year old male presenting with above-stated history and physical exam secondary to heart palpitations.  EKG revealed normal sinus rhythm with a premature ventricular contraction.  Laboratory data notable for hypokalemia with potassium 3.3 as such patient was given 40 mEq of potassium.  Patient with no further ectopy while in the emergency department.  Patient will be referred to cardiology for further outpatient evaluation.    ____________________________________________  FINAL CLINICAL IMPRESSION(S) / ED DIAGNOSES  Final diagnoses:  Heart palpitations  Hypokalemia     MEDICATIONS GIVEN DURING THIS VISIT:  Medications  potassium chloride (KLOR-CON) packet 40 mEq (40 mEq Oral Given 08/04/17 4600)     ED Discharge Orders    None       Note:  This document was prepared using Dragon voice recognition software and may include unintentional dictation errors.    Gregor Hams, MD 08/04/17 [phone]

## 2017-08-04 NOTE — ED Triage Notes (Signed)
Pt arrived via POV d/t feeling palpitations that woke him up. Pt denies any other symptoms at this time. Pt id A&O x4.

## 2017-09-30 ENCOUNTER — Other Ambulatory Visit: Payer: Self-pay | Admitting: Family Medicine

## 2017-09-30 DIAGNOSIS — J029 Acute pharyngitis, unspecified: Secondary | ICD-10-CM

## 2017-12-08 ENCOUNTER — Ambulatory Visit: Payer: Self-pay | Admitting: Unknown Physician Specialty

## 2017-12-08 ENCOUNTER — Encounter: Payer: Self-pay | Admitting: Unknown Physician Specialty

## 2017-12-08 VITALS — BP 117/69 | HR 73 | Temp 98.5°F | Ht 67.0 in | Wt 160.8 lb

## 2017-12-08 DIAGNOSIS — Z024 Encounter for examination for driving license: Secondary | ICD-10-CM

## 2017-12-08 NOTE — Progress Notes (Signed)
   BP 117/69 (BP Location: Left Arm, Cuff Size: Normal)   Pulse 73   Temp 98.5 F (36.9 C) (Oral)   Ht 5\' 7"  (1.702 m)   Wt 160 lb 12.8 oz (72.9 kg)   SpO2 98%   BMI 25.18 kg/m    Subjective:    Patient ID: Shaun Carney, male    DOB: 06-Jun-1960, 57 y.o.   MRN: [id number]  HPI: Shaun Carney is a 57 y.o. male  Chief Complaint  Patient presents with  . DOT Physical   See form  Relevant past medical, surgical, family and social history reviewed and updated as indicated. Interim medical history since our last visit reviewed. Allergies and medications reviewed and updated.  Review of Systems  Per HPI unless specifically indicated above     Objective:    BP 117/69 (BP Location: Left Arm, Cuff Size: Normal)   Pulse 73   Temp 98.5 F (36.9 C) (Oral)   Ht 5\' 7"  (1.702 m)   Wt 160 lb 12.8 oz (72.9 kg)   SpO2 98%   BMI 25.18 kg/m   Wt Readings from Last 3 Encounters:  12/08/17 160 lb 12.8 oz (72.9 kg)  08/04/17 160 lb (72.6 kg)  05/24/17 163 lb (73.9 kg)    Physical Exam  See form Results for orders placed or performed during the hospital encounter of 19/14/78  Basic metabolic panel  Result Value Ref Range   Sodium 138 135 - 145 mmol/L   Potassium 3.3 (L) 3.5 - 5.1 mmol/L   Chloride 100 98 - 111 mmol/L   CO2 28 22 - 32 mmol/L   Glucose, Bld 104 (H) 70 - 99 mg/dL   BUN 17 6 - 20 mg/dL   Creatinine, Ser 1.05 0.61 - 1.24 mg/dL   Calcium 9.2 8.9 - 10.3 mg/dL   GFR calc non Af Amer >60 >60 mL/min   GFR calc Af Amer >60 >60 mL/min   Anion gap 10 5 - 15  CBC  Result Value Ref Range   WBC 5.1 3.8 - 10.6 K/uL   RBC 4.91 4.40 - 5.90 MIL/uL   Hemoglobin 14.7 13.0 - 18.0 g/dL   HCT 43.6 40.0 - 52.0 %   MCV 88.7 80.0 - 100.0 fL   MCH 29.9 26.0 - 34.0 pg   MCHC 33.8 32.0 - 36.0 g/dL   RDW 12.9 11.5 - 14.5 %   Platelets 298 150 - 440 K/uL  Troponin I  Result Value Ref Range   Troponin I <0.03 <0.03 ng/mL  Fibrin derivatives D-Dimer  Result Value Ref Range   Fibrin  derivatives D-dimer ([facility]) 257.41 0.00 - 499.00 ng/mL (FEU)      Assessment & Plan:   Problem List Items Addressed This Visit    None    Visit Diagnoses    Encounter for CDL (commercial driving license) exam    -  Primary      See form: Qualifies for 1 year  Follow up plan: Return in about 1 year (around 12/09/2018).

## 2017-12-11 ENCOUNTER — Other Ambulatory Visit: Payer: Self-pay | Admitting: Family Medicine

## 2017-12-11 DIAGNOSIS — N529 Male erectile dysfunction, unspecified: Secondary | ICD-10-CM

## 2017-12-11 NOTE — Telephone Encounter (Signed)
Copied from [place] [id number]. Topic: Quick Communication - Rx Refill/Question >> Dec 11, 2017 10:34 AM Yvette Rack wrote: Pt states he needs the Rx sent to [facility] due to pricing.   Medication: sildenafil (REVATIO) 20 MG tablet   Has the patient contacted their pharmacy? yes   Preferred Pharmacy (with phone number or street name): [facility] (N), [place] - [place] [phone] (Phone) [phone] (Fax)  Agent: Please be advised that RX refills may take up to 3 business days. We ask that you follow-up with your pharmacy.

## 2017-12-12 NOTE — Telephone Encounter (Signed)
Left message for pt to return call to the office regarding refill request. Pt will need to contact [facility] to have the prescription transferred from [facility] where the prescription was sent previously.

## 2017-12-12 NOTE — Telephone Encounter (Signed)
[place] at [facility] to verify that prescription for revatio was filled on August 02, 2017; will contact 2nd pharmacy on file for status.

## 2017-12-12 NOTE — Telephone Encounter (Signed)
Pt states he did this yesterday and got his prescription filled.

## 2017-12-12 NOTE — Telephone Encounter (Signed)
Spoke with Dominica Severin, Education administrator at [facility]; he verifies that the pt's refill request for revatio was filled and picked up on 12/11/17; will refuse current request.

## 2017-12-27 DIAGNOSIS — M722 Plantar fascial fibromatosis: Secondary | ICD-10-CM | POA: Insufficient documentation

## 2018-02-05 ENCOUNTER — Other Ambulatory Visit: Payer: Self-pay | Admitting: Unknown Physician Specialty

## 2018-02-05 NOTE — Telephone Encounter (Signed)
Requested medication (s) are due for refill today: yes  Requested medication (s) are on the active medication list: yes  Last refill:  01/18/17 for 90 and 3 refills  Future visit scheduled: no  Notes to clinic:  Prescription expired on 01/18/18. Diuretics - Thiazide failed  Requested Prescriptions  Pending Prescriptions Disp Refills   hydrochlorothiazide (HYDRODIURIL) 25 MG tablet [Pharmacy Med Name: HYDROCHLOROTHIAZIDE 25MG  TABLETS] 90 tablet 3    Sig: TAKE 1 TABLET(25 MG) BY MOUTH DAILY     Cardiovascular: Diuretics - Thiazide Failed - 02/05/2018  3:22 PM      Failed - K in normal range and within 360 days    Potassium  Date Value Ref Range Status  08/04/2017 3.3 (L) 3.5 - 5.1 mmol/L Final  01/11/2013 3.7 3.5 - 5.1 mmol/L Final         Passed - Ca in normal range and within 360 days    Calcium  Date Value Ref Range Status  08/04/2017 9.2 8.9 - 10.3 mg/dL Final   Calcium, Total  Date Value Ref Range Status  01/11/2013 8.8 8.5 - 10.1 mg/dL Final         Passed - Cr in normal range and within 360 days    Creatinine  Date Value Ref Range Status  01/11/2013 1.04 0.60 - 1.30 mg/dL Final   Creatinine, Ser  Date Value Ref Range Status  08/04/2017 1.05 0.61 - 1.24 mg/dL Final         Passed - Na in normal range and within 360 days    Sodium  Date Value Ref Range Status  08/04/2017 138 135 - 145 mmol/L Final  06/10/2015 145 (H) 134 - 144 mmol/L Final  01/11/2013 137 136 - 145 mmol/L Final         Passed - Last BP in normal range    BP Readings from Last 1 Encounters:  12/08/17 117/69         Passed - Valid encounter within last 6 months    Recent Outpatient Visits          1 month ago 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

## 2018-02-14 ENCOUNTER — Ambulatory Visit: Payer: Self-pay | Admitting: Nurse Practitioner

## 2018-02-15 ENCOUNTER — Encounter: Payer: Self-pay | Admitting: Family Medicine

## 2018-02-15 ENCOUNTER — Ambulatory Visit: Payer: BLUE CROSS/BLUE SHIELD | Admitting: Family Medicine

## 2018-02-15 ENCOUNTER — Other Ambulatory Visit: Payer: Self-pay

## 2018-02-15 VITALS — BP 132/80 | HR 62 | Temp 98.2°F | Ht 70.0 in | Wt 162.0 lb

## 2018-02-15 DIAGNOSIS — S29011A Strain of muscle and tendon of front wall of thorax, initial encounter: Secondary | ICD-10-CM | POA: Diagnosis not present

## 2018-02-15 NOTE — Progress Notes (Signed)
BP 132/80   Pulse 62   Temp 98.2 F (36.8 C) (Oral)   Ht 5\' 10"  (1.778 m)   Wt 162 lb (73.5 kg)   SpO2 98%   BMI 23.24 kg/m    Subjective:    Patient ID: Shaun Carney, male    DOB: 07-09-1960, 58 y.o.   MRN: [id number]  HPI: Shaun Carney is a 58 y.o. male  Chief Complaint  Patient presents with  . Pain    x over a week. pt states hurts mostly at night. around right breast   Here today with over a week of localized right chest pain. 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 Pain seems worst at night when he lays certain ways. Taking ibuprofen with good temporary relief. Denies nausea, SOB, diaphoresis, chest tightness, cough, fevers.   Relevant past medical, surgical, family and social history reviewed and updated as indicated. Interim medical history since our last visit reviewed. Allergies and medications reviewed and updated.  Review of Systems  Per HPI unless specifically indicated above     Objective:    BP 132/80   Pulse 62   Temp 98.2 F (36.8 C) (Oral)   Ht 5\' 10"  (1.778 m)   Wt 162 lb (73.5 kg)   SpO2 98%   BMI 23.24 kg/m   Wt Readings from Last 3 Encounters:  02/15/18 162 lb (73.5 kg)  12/08/17 160 lb 12.8 oz (72.9 kg)  08/04/17 160 lb (72.6 kg)    Physical Exam Vitals signs and nursing note reviewed.  Constitutional:      Appearance: Normal appearance.  HENT:     Head: Atraumatic.  Eyes:     Extraocular Movements: Extraocular movements intact.     Conjunctiva/sclera: Conjunctivae normal.  Neck:     Musculoskeletal: Normal range of motion and neck supple.  Cardiovascular:     Rate and Rhythm: Normal rate and regular rhythm.  Pulmonary:     Effort: Pulmonary effort is normal.     Breath sounds: Normal breath sounds.  Musculoskeletal: Normal range of motion.        General: Tenderness (right pectoral muscle mildly ttp) present.  Skin:    General: Skin is warm and dry.  Neurological:     General: No focal deficit present.    Mental Status: He is oriented to person, place, and time.  Psychiatric:        Mood and Affect: Mood normal.        Thought Content: Thought content normal.        Judgment: Judgment normal.     Results for orders placed or performed during the hospital encounter of 76/28/31  Basic metabolic panel  Result Value Ref Range   Sodium 138 135 - 145 mmol/L   Potassium 3.3 (L) 3.5 - 5.1 mmol/L   Chloride 100 98 - 111 mmol/L   CO2 28 22 - 32 mmol/L   Glucose, Bld 104 (H) 70 - 99 mg/dL   BUN 17 6 - 20 mg/dL   Creatinine, Ser 1.05 0.61 - 1.24 mg/dL   Calcium 9.2 8.9 - 10.3 mg/dL   GFR calc non Af Amer >60 >60 mL/min   GFR calc Af Amer >60 >60 mL/min   Anion gap 10 5 - 15  CBC  Result Value Ref Range   WBC 5.1 3.8 - 10.6 K/uL   RBC 4.91 4.40 - 5.90 MIL/uL   Hemoglobin 14.7 13.0 - 18.0 g/dL   HCT 43.6 40.0 -  52.0 %   MCV 88.7 80.0 - 100.0 fL   MCH 29.9 26.0 - 34.0 pg   MCHC 33.8 32.0 - 36.0 g/dL   RDW 12.9 11.5 - 14.5 %   Platelets 298 150 - 440 K/uL  Troponin I  Result Value Ref Range   Troponin I <0.03 <0.03 ng/mL  Fibrin derivatives D-Dimer  Result Value Ref Range   Fibrin derivatives D-dimer ([facility]) 257.41 0.00 - 499.00 ng/mL (FEU)      Assessment & Plan:   Problem List Items Addressed This Visit    None    Visit Diagnoses    Pectoralis muscle strain, initial encounter    -  Primary   Declines EKG, states he has flexeril leftover at home he will start taking. Continue NSAIDs, heat, stretches. Avoid heavy lifting       Follow up plan: Return if symptoms worsen or fail to improve.

## 2018-02-21 ENCOUNTER — Ambulatory Visit (INDEPENDENT_AMBULATORY_CARE_PROVIDER_SITE_OTHER): Payer: BLUE CROSS/BLUE SHIELD | Admitting: Family Medicine

## 2018-02-21 ENCOUNTER — Encounter: Payer: Self-pay | Admitting: Family Medicine

## 2018-02-21 VITALS — BP 124/73 | HR 78 | Temp 98.4°F | Ht 70.0 in | Wt 162.0 lb

## 2018-02-21 DIAGNOSIS — B029 Zoster without complications: Secondary | ICD-10-CM

## 2018-02-21 MED ORDER — VALACYCLOVIR HCL 1 G PO TABS: 1000.0000 mg | ORAL_TABLET | Freq: Three times a day (TID) | ORAL | 0 refills | Status: DC

## 2018-02-21 MED ORDER — PREDNISONE 50 MG PO TABS: 50.0000 mg | ORAL_TABLET | Freq: Every day | ORAL | 0 refills | Status: DC

## 2018-02-21 MED ORDER — TRAMADOL HCL 50 MG PO TABS: 50.0000 mg | ORAL_TABLET | Freq: Every evening | ORAL | 0 refills | Status: DC | PRN

## 2018-02-21 NOTE — Progress Notes (Signed)
BP 124/73   Pulse 78   Temp 98.4 F (36.9 C) (Oral)   Ht 5\' 10"  (1.778 m)   Wt 162 lb (73.5 kg)   SpO2 97%   BMI 23.24 kg/m    Subjective:    Patient ID: Shaun Carney, male    DOB: 1960/07/21, 58 y.o.   MRN: [id number]  HPI: Shaun Carney is a 58 y.o. male  Chief Complaint  Patient presents with  . Rash    Rash on back and chest. Hurts and itches, Having difficulty sleeping due ot it.   Was having a week or two of right superficial chest pain that was dx'd as a pectoralis strain but not improving on flexeril and stretches. Now having a painful rash come up in the area. Having difficulty sleeping the pain is so bad. Sometimes itches but mostly burns and tingles. No recent injury, new products.   Relevant past medical, surgical, family and social history reviewed and updated as indicated. Interim medical history since our last visit reviewed. Allergies and medications reviewed and updated.  Review of Systems  Per HPI unless specifically indicated above     Objective:    BP 124/73   Pulse 78   Temp 98.4 F (36.9 C) (Oral)   Ht 5\' 10"  (1.778 m)   Wt 162 lb (73.5 kg)   SpO2 97%   BMI 23.24 kg/m   Wt Readings from Last 3 Encounters:  02/21/18 162 lb (73.5 kg)  02/15/18 162 lb (73.5 kg)  12/08/17 160 lb 12.8 oz (72.9 kg)    Physical Exam Vitals signs and nursing note reviewed.  Constitutional:      Appearance: Normal appearance.  HENT:     Head: Atraumatic.  Eyes:     Extraocular Movements: Extraocular movements intact.     Conjunctiva/sclera: Conjunctivae normal.  Neck:     Musculoskeletal: Normal range of motion and neck supple.  Cardiovascular:     Rate and Rhythm: Normal rate and regular rhythm.  Pulmonary:     Effort: Pulmonary effort is normal.     Breath sounds: Normal breath sounds.  Musculoskeletal: Normal range of motion.  Skin:    General: Skin is warm and dry.     Findings: Rash (erythematous vesicular rash with some areas of scabbing localized  to right pectoral area, ttp) present.  Neurological:     General: No focal deficit present.     Mental Status: He is oriented to person, place, and time.  Psychiatric:        Mood and Affect: Mood normal.        Thought Content: Thought content normal.        Judgment: Judgment normal.     Results for orders placed or performed during the hospital encounter of 42/68/34  Basic metabolic panel  Result Value Ref Range   Sodium 138 135 - 145 mmol/L   Potassium 3.3 (L) 3.5 - 5.1 mmol/L   Chloride 100 98 - 111 mmol/L   CO2 28 22 - 32 mmol/L   Glucose, Bld 104 (H) 70 - 99 mg/dL   BUN 17 6 - 20 mg/dL   Creatinine, Ser 1.05 0.61 - 1.24 mg/dL   Calcium 9.2 8.9 - 10.3 mg/dL   GFR calc non Af Amer >60 >60 mL/min   GFR calc Af Amer >60 >60 mL/min   Anion gap 10 5 - 15  CBC  Result Value Ref Range   WBC 5.1 3.8 - 10.6 K/uL   RBC  4.91 4.40 - 5.90 MIL/uL   Hemoglobin 14.7 13.0 - 18.0 g/dL   HCT 43.6 40.0 - 52.0 %   MCV 88.7 80.0 - 100.0 fL   MCH 29.9 26.0 - 34.0 pg   MCHC 33.8 32.0 - 36.0 g/dL   RDW 12.9 11.5 - 14.5 %   Platelets 298 150 - 440 K/uL  Troponin I  Result Value Ref Range   Troponin I <0.03 <0.03 ng/mL  Fibrin derivatives D-Dimer  Result Value Ref Range   Fibrin derivatives D-dimer ([facility]) 257.41 0.00 - 499.00 ng/mL (FEU)      Assessment & Plan:   Problem List Items Addressed This Visit    None    Visit Diagnoses    Herpes zoster without complication    -  Primary   Start prednisone and valtrex. Small amount of tramadol sent for bedtime use or severe pain   Relevant Medications   valACYclovir (VALTREX) 1000 MG tablet       Follow up plan: Return if symptoms worsen or fail to improve.

## 2018-03-07 ENCOUNTER — Ambulatory Visit: Payer: Self-pay | Admitting: *Deleted

## 2018-03-07 MED ORDER — GABAPENTIN 100 MG PO CAPS: 100.0000 mg | ORAL_CAPSULE | Freq: Three times a day (TID) | ORAL | 0 refills | Status: DC | PRN

## 2018-03-07 NOTE — Telephone Encounter (Addendum)
Patient called back and he was asked about the request to refill medication for the shingles he was evaluated for on 02/21/18 by Merrie Roof, PA. He says that he finished the medication and the burning went away. He says the burning came back on Monday evening and he's been good and not scratching like before. He says he would like a refill on the medication. I asked him is he having any breakouts, he denies and says it's just the burning. I advised I will send to Apolonio Schneiders and someone will call with her recommendation, he verbalized understanding.  Reason for Disposition . Caller requesting a NON-URGENT new prescription or refill and triager unable to refill per unit policy  Protocols used: MEDICATION QUESTION CALL-A-AH

## 2018-03-07 NOTE — Telephone Encounter (Signed)
Attempted to contact pt; no answer at [phone].

## 2018-03-07 NOTE — Telephone Encounter (Signed)
Patient called, left VM to return call to the office. Attempted x 3 by NT, will route to provider for review and recommendation.  Message from Jodie Echevaria sent at 03/07/2018 9:00 AM EST   Patient called to say that he was seen on 02/21/2018 for shingles was given predniSONE (DELTASONE) 50 MG tablet and valACYclovir (VALTREX) 1000 MG tablet he said this was given for the burning sensation on his back which the burning has returned and is asking for another dose of both medication please. Patient request these Rx's sent to the [facility] on file. Please call patient back with answer. Ph# [phone]

## 2018-03-07 NOTE — Telephone Encounter (Signed)
For you -

## 2018-03-07 NOTE — Telephone Encounter (Signed)
Gabapentin sent to relieve his pain sxs. Pt notified

## 2018-03-07 NOTE — Telephone Encounter (Signed)
Message from Jodie Echevaria sent at 03/07/2018 9:00 AM EST   Patient called to say that he was seen on 02/21/2018 for shingles was given predniSONE (DELTASONE) 50 MG tablet and valACYclovir (VALTREX) 1000 MG tablet he said this was given for the burning sensation on his back which the burning has returned and is asking for another dose of both medication please. Patient request these Rx's sent to the [facility] on file. Please call patient back with answer. Ph# [phone]

## 2018-03-07 NOTE — Addendum Note (Signed)
Addended by: Merrie Roof E on: 03/07/2018 03:28 PM   Modules accepted: Orders

## 2018-03-26 ENCOUNTER — Other Ambulatory Visit: Payer: Self-pay | Admitting: Family Medicine

## 2018-03-26 DIAGNOSIS — J029 Acute pharyngitis, unspecified: Secondary | ICD-10-CM

## 2018-03-27 NOTE — Telephone Encounter (Signed)
Requested Prescriptions  Pending Prescriptions Disp Refills  . 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

## 2018-05-12 ENCOUNTER — Other Ambulatory Visit: Payer: Self-pay | Admitting: Family Medicine

## 2018-05-12 DIAGNOSIS — J029 Acute pharyngitis, unspecified: Secondary | ICD-10-CM

## 2018-05-24 ENCOUNTER — Ambulatory Visit: Payer: Self-pay | Admitting: *Deleted

## 2018-05-24 DIAGNOSIS — Z20828 Contact with and (suspected) exposure to other viral communicable diseases: Secondary | ICD-10-CM | POA: Diagnosis not present

## 2018-05-24 NOTE — Telephone Encounter (Signed)
Patient works at [employer]. Got to work this morning and his scan temperature was normal, within a short time he began feeling not so good. Scan temperature was then 100.4. Sent home. He is Psychologist, counselling working with 2 positive patients. Began coughing today, also. No difficulty breathing. Reports the  Health department is scheduled to do testing at the facility today or tomorrow. Reviewed urgent symptoms which he would need to seek imm Reason for Disposition . [1] Fever (or feeling feverish) OR cough AND [2] within 14 Days of COVID-19 EXPOSURE (Close Contact)  Answer Assessment - Initial Assessment Questions 1. COVID-19 DIAGNOSIS: "Who made your Coronavirus (COVID-19) diagnosis?" "Was it confirmed by a positive lab test?" If not diagnosed by a HCP, ask "Are there lots of cases (community spread) where you live?" (See public health department website, if unsure)   * MAJOR community spread: high number of cases; numbers of cases are increasing; many people hospitalized.   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 2. ONSET: "When did the COVID-19 symptoms start?"      Today after arriving at work 3. WORST SYMPTOM: "What is your worst symptom?" (e.g., cough, fever, shortness of breath, muscle aches)     Cough and fever only 4. COUGH: "How bad is the cough?"       Just started today 5. FEVER: "Do you have a fever?" If so, ask: "What is your temperature, how was it measured, and when did it start?"     100.4 scanned at work 6. RESPIRATORY STATUS: "Describe your breathing?" (e.g., shortness of breath, wheezing, unable to speak)     No difficulties today 7. BETTER-SAME-WORSE: "Are you getting better, staying the same or getting worse compared to yesterday?"  If getting worse, ask, "In what way?"     na 8. HIGH RISK DISEASE: "Do you have any chronic medical problems?" (e.g., asthma, heart or lung  disease, weak immune system, etc.)     htn 9. PREGNANCY: "Is there any chance you are pregnant?" "When was your last menstrual period?"     na 10. OTHER SYMPTOMS: "Do you have any other symptoms?"  (e.g., runny nose, headache, sore throat, loss of smell)       no  Protocols used: CORONAVIRUS (COVID-19) EXPOSURE-A-AH, CORONAVIRUS (COVID-19) DIAGNOSED OR SUSPECTED-A-AH

## 2018-06-07 ENCOUNTER — Telehealth: Payer: Self-pay | Admitting: Family Medicine

## 2018-06-07 ENCOUNTER — Other Ambulatory Visit: Payer: Self-pay | Admitting: Family Medicine

## 2018-06-07 MED ORDER — BETAMETHASONE DIPROPIONATE 0.05 % EX CREA: TOPICAL_CREAM | Freq: Two times a day (BID) | CUTANEOUS | 0 refills | Status: DC

## 2018-06-07 NOTE — Telephone Encounter (Signed)
Erroneous encounter

## 2018-06-19 ENCOUNTER — Telehealth: Payer: Self-pay

## 2018-06-19 NOTE — Telephone Encounter (Signed)
Letter faxed to [facility] and place in mail to patient. Patient is aware. Pt is asking for some medication for his lungs. Please advise.

## 2018-06-19 NOTE — Telephone Encounter (Signed)
Copied from [place] [phone]. Topic: General - Inquiry >> Jun 19, 2018  8:37 AM Mathis Bud wrote: Reason for CRM: Patient called stating he spoke to [facility] about "out of work letter" Patient is staying work letter needs to be 2 weeks.  Please fax [facility] [employer] to [phone].  Please attention Fax to "Sharyn Lull"  Patient would like copy of put of work letter to be mail to his home address. [address] [place] [address] Patient is also stating Jeananne Rama would be prescribing him something for his lungs.  Patient states he was released from health department  and is still have diarrhea and and bad cough Patient would like PCP or nurse to give a call back regarding this,  Call back # [phone] >> Jun 19, 2018 11:46 AM Guadalupe Maple, MD wrote: Madaline Brilliant to do

## 2018-06-20 ENCOUNTER — Other Ambulatory Visit: Payer: Self-pay | Admitting: Family Medicine

## 2018-06-20 DIAGNOSIS — K219 Gastro-esophageal reflux disease without esophagitis: Secondary | ICD-10-CM

## 2018-06-20 DIAGNOSIS — I10 Essential (primary) hypertension: Secondary | ICD-10-CM

## 2018-06-20 MED ORDER — FLUTICASONE-SALMETEROL 250-50 MCG/DOSE IN AEPB: 1.0000 | INHALATION_SPRAY | Freq: Two times a day (BID) | RESPIRATORY_TRACT | 3 refills | Status: DC

## 2018-06-20 NOTE — Telephone Encounter (Signed)
Review for refill of pantoprazole 40 mg tab. Prescription expired on 06/01/2018 Dr. Jeananne Rama

## 2018-06-20 NOTE — Telephone Encounter (Signed)
Message relayed to patient. Verbalized understanding and denied questions.   

## 2018-06-20 NOTE — Telephone Encounter (Signed)
rx sent to [facility]

## 2018-06-25 ENCOUNTER — Other Ambulatory Visit: Payer: Self-pay | Admitting: Family Medicine

## 2018-06-25 ENCOUNTER — Other Ambulatory Visit: Payer: Self-pay

## 2018-06-25 ENCOUNTER — Ambulatory Visit (INDEPENDENT_AMBULATORY_CARE_PROVIDER_SITE_OTHER): Payer: BLUE CROSS/BLUE SHIELD | Admitting: Family Medicine

## 2018-06-25 ENCOUNTER — Other Ambulatory Visit: Payer: BLUE CROSS/BLUE SHIELD

## 2018-06-25 ENCOUNTER — Encounter: Payer: Self-pay | Admitting: Family Medicine

## 2018-06-25 DIAGNOSIS — U071 COVID-19: Secondary | ICD-10-CM | POA: Diagnosis not present

## 2018-06-25 DIAGNOSIS — Z8616 Personal history of COVID-19: Secondary | ICD-10-CM | POA: Insufficient documentation

## 2018-06-25 NOTE — Assessment & Plan Note (Signed)
Patient with recovery from COVID-19 virus infection with no further symptoms wants to go back to work but needs all clear notice.

## 2018-06-25 NOTE — Progress Notes (Signed)
There were no vitals taken for this visit.   Subjective:    Patient ID: Shaun Carney, male    DOB: Mar 08, 1960, 58 y.o.   MRN: [id number]  HPI: Shaun Carney is a 58 y.o. male  Follow-up COVID-19  Telemedicine using audio/video telecommunications for a synchronous communication visit. Today's visit due to COVID-19 isolation precautions I connected with and verified that I am speaking with the correct person using two identifiers.   I discussed the limitations, risks, security and privacy concerns of performing an evaluation and management service by telecommunication and the availability of in person appointments. I also discussed with the patient that there may be a patient responsible charge related to this service. The patient expressed understanding and agreed to proceed. The patient's location is home. I am at home.  Discussed with patient follow-up COVID-19 patient had stormy course had blood test and ER visit with hospitalization April 30.  Patient since has recovered and is looking to change work.  Patient had previously been working at [employer] and is not comfortable going back to work there.  Patient's regained his energy and is looking for an allCLEAR note to start a new job. Patient with no symptoms no cough cold fever chills. Did chart review and not able to identify any notes previously from the emergency room in our records.  Patient states he went to [facility].  Relevant past medical, surgical, family and social history reviewed and updated as indicated. Interim medical history since our last visit reviewed. Allergies and medications reviewed and updated.  Review of Systems  Constitutional: Negative.   Respiratory: Negative.   Cardiovascular: Negative.     Per HPI unless specifically indicated above     Objective:    There were no vitals taken for this visit.  Wt Readings from Last 3 Encounters:  02/21/18 162 lb (73.5 kg)  02/15/18 162 lb (73.5 kg)  12/08/17  160 lb 12.8 oz (72.9 kg)    Physical Exam  Results for orders placed or performed during the hospital encounter of 22/63/33  Basic metabolic panel  Result Value Ref Range   Sodium 138 135 - 145 mmol/L   Potassium 3.3 (L) 3.5 - 5.1 mmol/L   Chloride 100 98 - 111 mmol/L   CO2 28 22 - 32 mmol/L   Glucose, Bld 104 (H) 70 - 99 mg/dL   BUN 17 6 - 20 mg/dL   Creatinine, Ser 1.05 0.61 - 1.24 mg/dL   Calcium 9.2 8.9 - 10.3 mg/dL   GFR calc non Af Amer >60 >60 mL/min   GFR calc Af Amer >60 >60 mL/min   Anion gap 10 5 - 15  CBC  Result Value Ref Range   WBC 5.1 3.8 - 10.6 K/uL   RBC 4.91 4.40 - 5.90 MIL/uL   Hemoglobin 14.7 13.0 - 18.0 g/dL   HCT 43.6 40.0 - 52.0 %   MCV 88.7 80.0 - 100.0 fL   MCH 29.9 26.0 - 34.0 pg   MCHC 33.8 32.0 - 36.0 g/dL   RDW 12.9 11.5 - 14.5 %   Platelets 298 150 - 440 K/uL  Troponin I  Result Value Ref Range   Troponin I <0.03 <0.03 ng/mL  Fibrin derivatives D-Dimer  Result Value Ref Range   Fibrin derivatives D-dimer ([facility]) 257.41 0.00 - 499.00 ng/mL (FEU)      Assessment & Plan:   Problem List Items Addressed This Visit      Other   COVID-19 virus infection  Patient with recovery from COVID-19 virus infection with no further symptoms wants to go back to work but needs all clear notice.        Covid Ab test ordered  I discussed the assessment and treatment plan with the patient. The patient was provided an opportunity to ask questions and all were answered. The patient agreed with the plan and demonstrated an understanding of the instructions.   The patient was advised to call back or seek an in-person evaluation if the symptoms worsen or if the condition fails to improve as anticipated.   I provided 21+ minutes of time during this encounter. Follow up plan: Return if symptoms worsen or fail to improve, for As scheduled.

## 2018-06-26 ENCOUNTER — Telehealth: Payer: Self-pay

## 2018-06-26 LAB — SAR COV2 SEROLOGY (COVID19)AB(IGG),IA: SARS-CoV-2 Ab, IgG: POSITIVE

## 2018-06-26 NOTE — Telephone Encounter (Signed)
Has the patient been notified of lab results?  Copied from [place] [id number]. Topic: General - Other >> Jun 26, 2018  1:29 PM Nils Flack wrote: Reason for CRM: pt would like call as soon as labs are in  Thanks

## 2018-08-29 ENCOUNTER — Encounter: Payer: Self-pay | Admitting: Emergency Medicine

## 2018-08-29 ENCOUNTER — Emergency Department: Payer: Self-pay

## 2018-08-29 ENCOUNTER — Other Ambulatory Visit: Payer: Self-pay

## 2018-08-29 ENCOUNTER — Emergency Department
Admission: EM | Admit: 2018-08-29 | Discharge: 2018-08-29 | Disposition: A | Discharge status: Home / Self Care | Payer: Self-pay | Attending: Emergency Medicine | Admitting: Emergency Medicine

## 2018-08-29 DIAGNOSIS — I10 Essential (primary) hypertension: Secondary | ICD-10-CM | POA: Insufficient documentation

## 2018-08-29 DIAGNOSIS — R109 Unspecified abdominal pain: Secondary | ICD-10-CM | POA: Insufficient documentation

## 2018-08-29 LAB — CBC WITH DIFFERENTIAL/PLATELET
Abs Immature Granulocytes: 0.04 10*3/uL (ref 0.00–0.07)
Basophils Absolute: 0.1 10*3/uL (ref 0.0–0.1)
Basophils Relative: 1 %
Eosinophils Absolute: 0.2 10*3/uL (ref 0.0–0.5)
Eosinophils Relative: 3 %
HCT: 42.2 % (ref 39.0–52.0)
Hemoglobin: 14.1 g/dL (ref 13.0–17.0)
Immature Granulocytes: 1 %
Lymphocytes Relative: 29 %
Lymphs Abs: 1.8 10*3/uL (ref 0.7–4.0)
MCH: 29.4 pg (ref 26.0–34.0)
MCHC: 33.4 g/dL (ref 30.0–36.0)
MCV: 88.1 fL (ref 80.0–100.0)
Monocytes Absolute: 0.7 10*3/uL (ref 0.1–1.0)
Monocytes Relative: 11 %
Neutro Abs: 3.5 10*3/uL (ref 1.7–7.7)
Neutrophils Relative %: 55 %
Platelets: 321 10*3/uL (ref 150–400)
RBC: 4.79 MIL/uL (ref 4.22–5.81)
RDW: 12.2 % (ref 11.5–15.5)
WBC: 6.2 10*3/uL (ref 4.0–10.5)
nRBC: 0 % (ref 0.0–0.2)

## 2018-08-29 LAB — COMPREHENSIVE METABOLIC PANEL
ALT: 21 U/L (ref 0–44)
AST: 24 U/L (ref 15–41)
Albumin: 4.1 g/dL (ref 3.5–5.0)
Alkaline Phosphatase: 79 U/L (ref 38–126)
Anion gap: 13 (ref 5–15)
BUN: 17 mg/dL (ref 6–20)
CO2: 25 mmol/L (ref 22–32)
Calcium: 9.4 mg/dL (ref 8.9–10.3)
Chloride: 102 mmol/L (ref 98–111)
Creatinine, Ser: 1.01 mg/dL (ref 0.61–1.24)
GFR calc Af Amer: >60 mL/min (ref >60)
GFR calc non Af Amer: >60 mL/min (ref >60)
Glucose, Bld: 114 mg/dL — ABNORMAL HIGH (ref 70–99)
Potassium: 3.5 mmol/L (ref 3.5–5.1)
Sodium: 140 mmol/L (ref 135–145)
Total Bilirubin: 0.8 mg/dL (ref 0.3–1.2)
Total Protein: 8.3 g/dL — ABNORMAL HIGH (ref 6.5–8.1)

## 2018-08-29 LAB — URINALYSIS, COMPLETE (UACMP) WITH MICROSCOPIC
Bacteria, UA: NONE SEEN
Bilirubin Urine: NEGATIVE
Glucose, UA: NEGATIVE mg/dL
Hgb urine dipstick: NEGATIVE
Ketones, ur: NEGATIVE mg/dL
Leukocytes,Ua: NEGATIVE
Nitrite: NEGATIVE
Protein, ur: NEGATIVE mg/dL
Specific Gravity, Urine: 1.008 (ref 1.005–1.030)
Squamous Epithelial / HPF: NONE SEEN (ref 0–5)
WBC, UA: NONE SEEN WBC/hpf (ref 0–5)
pH: 6 (ref 5.0–8.0)

## 2018-08-29 LAB — LIPASE, BLOOD: Lipase: 29 U/L (ref 11–51)

## 2018-08-29 MED ORDER — TRAMADOL HCL 50 MG PO TABS: 50.0000 mg | ORAL_TABLET | Freq: Four times a day (QID) | ORAL | 0 refills | Status: DC | PRN

## 2018-08-29 MED ORDER — TAMSULOSIN HCL 0.4 MG PO CAPS: 0.4000 mg | ORAL_CAPSULE | Freq: Every day | ORAL | 0 refills | Status: DC

## 2018-08-29 NOTE — ED Triage Notes (Signed)
Pt c/o RT flank pain and frequent urination x1 week. Denies fever. NAD noted

## 2018-08-29 NOTE — ED Provider Notes (Addendum)
[facility] Emergency Department Provider Note       Time seen: ----------------------------------------- 8:51 AM on 08/29/2018 -----------------------------------------   I have reviewed the triage vital signs and the nursing notes.  HISTORY   Chief Complaint Flank Pain    HPI Shaun Carney is a 58 y.o. male with a history of anxiety, GERD, hypertension who presents to the ED for right flank pain and frequent urination for the last week.  Patient denies any pain when he urinates.  Pain is 8 out of 10 in his right flank.  He denies any hematuria.  He has never had this problem before.  Past Medical History:  Diagnosis Date  . Anxiety   . GERD (gastroesophageal reflux disease)   . Hypertension     Patient Active Problem List   Diagnosis Date Noted  . COVID-19 virus infection 06/25/2018  . Plantar fasciitis 12/27/2017  . Chondromalacia patellae 01/18/2017  . ED (erectile dysfunction) 06/10/2015  . Essential hypertension 05/13/2015    Past Surgical History:  Procedure Laterality Date  . CATARACT EXTRACTION W/PHACO Left 09/29/2016   Procedure: CATARACT EXTRACTION PHACO AND INTRAOCULAR LENS PLACEMENT (IOC);  Surgeon: Eulogio Bear, MD;  Location: [facility] ORS;  Service: Ophthalmology;  Laterality: Left;  Lot # [id number] H US:00:40.4 AP%: 7.0 CDE: 2.87  . EYE SURGERY    . INSERTION OF AHMED VALVE Left 05/18/2015   Procedure: INSERTION OF AHMED VALVE AND SCLERAL PATCH GRAFT TO LEFT EYE;  Surgeon: Ronnell Freshwater, MD;  Location: [place];  Service: Ophthalmology;  Laterality: Left;  1ST CASE PER DR VIN  . NO PAST SURGERIES      Allergies Patient has no known allergies.  Social History Social History   Tobacco Use  . Smoking status: Never Smoker  . Smokeless tobacco: Never Used  Substance Use Topics  . Alcohol use: Yes    Alcohol/week: 6.0 standard drinks    Types: 6 Cans of beer per week  . Drug use: No   Review of  Systems Constitutional: Negative for fever. Cardiovascular: Negative for chest pain. Respiratory: Negative for shortness of breath. Gastrointestinal: Positive for flank pain Genitourinary: Positive for polyuria Musculoskeletal: Negative for back pain. Skin: Negative for rash. Neurological: Negative for headaches, focal weakness or numbness.  All systems negative/normal/unremarkable except as stated in the HPI  ____________________________________________   PHYSICAL EXAM:  VITAL SIGNS: ED Triage Vitals  Enc Vitals Group     BP 08/29/18 0821 (!) 148/91     Pulse Rate 08/29/18 0821 82     Resp 08/29/18 0820 16     Temp 08/29/18 0820 98.6 F (37 C)     Temp Source 08/29/18 0820 Oral     SpO2 08/29/18 0830 96 %     Weight --      Height --      Head Circumference --      Peak Flow --      Pain Score 08/29/18 0820 8     Pain Loc --      Pain Edu? --      Excl. in [place]? --    Constitutional: Alert and oriented. Well appearing and in no distress. Eyes: Conjunctivae are normal. Normal extraocular movements. Cardiovascular: Normal rate, regular rhythm. No murmurs, rubs, or gallops. Respiratory: Normal respiratory effort without tachypnea nor retractions. Breath sounds are clear and equal bilaterally. No wheezes/rales/rhonchi. Gastrointestinal: Soft and nontender. Normal bowel sounds Musculoskeletal: Nontender with normal range of motion in extremities. No lower extremity tenderness  nor edema. Neurologic:  Normal speech and language. No gross focal neurologic deficits are appreciated.  Skin:  Skin is warm, dry and intact. No rash noted. Psychiatric: Mood and affect are normal. Speech and behavior are normal.  ____________________________________________  ED COURSE:  As part of my medical decision making, I reviewed the following data within the [place] History obtained from family if available, nursing notes, old chart and ekg, as well as notes from prior ED  visits. Patient presented for flank pain and polyuria, we will assess with labs and imaging as indicated at this time.   Procedures  Shaun Carney was evaluated in Emergency Department on 08/29/2018 for the symptoms described in the history of present illness. He was evaluated in the context of the global COVID-19 pandemic, which necessitated consideration that the patient might be at risk for infection with the SARS-CoV-2 virus that causes COVID-19. Institutional protocols and algorithms that pertain to the evaluation of patients at risk for COVID-19 are in a state of rapid change based on information released by regulatory bodies including the CDC and federal and state organizations. These policies and algorithms were followed during the patient's care in the ED.  ____________________________________________   LABS (pertinent positives/negatives)  Labs Reviewed  COMPREHENSIVE METABOLIC PANEL - Abnormal; Notable for the following components:      Result Value   Glucose, Bld 114 (*)    Total Protein 8.3 (*)    All other components within normal limits  URINALYSIS, COMPLETE (UACMP) WITH MICROSCOPIC - Abnormal; Notable for the following components:   Color, Urine STRAW (*)    APPearance CLEAR (*)    All other components within normal limits  CBC WITH DIFFERENTIAL/PLATELET  LIPASE, BLOOD    RADIOLOGY Images were viewed by me  CT renal protocol IMPRESSION: 1. No acute findings are noted in the abdomen or pelvis to account for the patient's symptoms. Specifically, no urinary tract calculi no findings of urinary tract obstruction are noted at this time. ____________________________________________   DIFFERENTIAL DIAGNOSIS   Renal colic, UTI, pyelonephritis  FINAL ASSESSMENT AND PLAN  Flank pain   Plan: The patient had presented for flank pain and polyuria. Patient's labs did not reveal any acute process. Patient's imaging was also reassuring.  I suspect the flank pain is muscular  and he has early BPH which is unrelated.  I will try Flomax for him as well as some tramadol for pain.  He is cleared for outpatient follow-up.   Laurence Aly, MD    Note: This note was generated in part or whole with voice recognition software. Voice recognition is usually quite accurate but there are transcription errors that can and very often do occur. I apologize for any typographical errors that were not detected and corrected.     Earleen Newport, MD 08/29/18 [id number]    Earleen Newport, MD 08/29/18 [phone]

## 2018-11-01 ENCOUNTER — Encounter: Payer: Self-pay | Admitting: Family Medicine

## 2018-11-01 ENCOUNTER — Ambulatory Visit (INDEPENDENT_AMBULATORY_CARE_PROVIDER_SITE_OTHER): Payer: 59 | Admitting: Family Medicine

## 2018-11-01 ENCOUNTER — Other Ambulatory Visit: Payer: Self-pay

## 2018-11-01 VITALS — BP 134/84 | HR 73 | Temp 98.6°F

## 2018-11-01 DIAGNOSIS — N50811 Right testicular pain: Secondary | ICD-10-CM

## 2018-11-01 LAB — UA/M W/RFLX CULTURE, ROUTINE
Bilirubin, UA: NEGATIVE
Glucose, UA: NEGATIVE
Ketones, UA: NEGATIVE
Leukocytes,UA: NEGATIVE
Nitrite, UA: NEGATIVE
Protein,UA: NEGATIVE
Specific Gravity, UA: 1.02 (ref 1.005–1.030)
Urobilinogen, Ur: 1 mg/dL (ref 0.2–1.0)
pH, UA: 7 (ref 5.0–7.5)

## 2018-11-01 LAB — MICROSCOPIC EXAMINATION
Bacteria, UA: NONE SEEN
WBC, UA: NONE SEEN /hpf (ref 0–5)

## 2018-11-01 MED ORDER — CIPROFLOXACIN HCL 250 MG PO TABS: 250.0000 mg | ORAL_TABLET | Freq: Two times a day (BID) | ORAL | 0 refills | Status: DC

## 2018-11-01 NOTE — Progress Notes (Signed)
BP 134/84   Pulse 73   Temp 98.6 F (37 C) (Oral)   SpO2 98%    Subjective:    Patient ID: Shaun Carney, male    DOB: 06-07-1960, 58 y.o.   MRN: [id number]  HPI: Warfield Ficke is a 58 y.o. male  Chief Complaint  Patient presents with  . Testicle Pain    pt states he has had right testicle pain for about 1 week, states he has had this before and was treated by Malachy Mood for the same issue    Intermittent right testicular pain for the past week, dull and achy and seems worst with movement. Denies any fevers, chills, redness, swelling, injury to area, lumps on testicles, rashes. States he had something similar years ago but does not remember what got him better.   Relevant past medical, surgical, family and social history reviewed and updated as indicated. Interim medical history since our last visit reviewed. Allergies and medications reviewed and updated.  Review of Systems  Per HPI unless specifically indicated above     Objective:    BP 134/84   Pulse 73   Temp 98.6 F (37 C) (Oral)   SpO2 98%   Wt Readings from Last 3 Encounters:  02/21/18 162 lb (73.5 kg)  02/15/18 162 lb (73.5 kg)  12/08/17 160 lb 12.8 oz (72.9 kg)    Physical Exam Vitals signs and nursing note reviewed. Exam conducted with a chaperone present.  Constitutional:      Appearance: Normal appearance.  HENT:     Head: Atraumatic.  Eyes:     Extraocular Movements: Extraocular movements intact.     Conjunctiva/sclera: Conjunctivae normal.  Neck:     Musculoskeletal: Normal range of motion and neck supple.  Cardiovascular:     Rate and Rhythm: Normal rate and regular rhythm.  Pulmonary:     Effort: Pulmonary effort is normal.     Breath sounds: Normal breath sounds.  Abdominal:     General: Bowel sounds are normal.     Palpations: Abdomen is soft.     Tenderness: There is no abdominal tenderness. There is no right CVA tenderness, left CVA tenderness or guarding.  Genitourinary:    Penis: Normal.       Scrotum/Testes: Normal.     Comments: No redness, swelling of scrotum, testes and scrotal sac nontender to palpation. No evidence of hernia Musculoskeletal: Normal range of motion.  Skin:    General: Skin is warm and dry.  Neurological:     General: No focal deficit present.     Mental Status: He is oriented to person, place, and time.  Psychiatric:        Mood and Affect: Mood normal.        Thought Content: Thought content normal.        Judgment: Judgment normal.     Results for orders placed or performed during the hospital encounter of 08/29/18  CBC with Differential  Result Value Ref Range   WBC 6.2 4.0 - 10.5 K/uL   RBC 4.79 4.22 - 5.81 MIL/uL   Hemoglobin 14.1 13.0 - 17.0 g/dL   HCT 42.2 39.0 - 52.0 %   MCV 88.1 80.0 - 100.0 fL   MCH 29.4 26.0 - 34.0 pg   MCHC 33.4 30.0 - 36.0 g/dL   RDW 12.2 11.5 - 15.5 %   Platelets 321 150 - 400 K/uL   nRBC 0.0 0.0 - 0.2 %   Neutrophils Relative % 55 %  Neutro Abs 3.5 1.7 - 7.7 K/uL   Lymphocytes Relative 29 %   Lymphs Abs 1.8 0.7 - 4.0 K/uL   Monocytes Relative 11 %   Monocytes Absolute 0.7 0.1 - 1.0 K/uL   Eosinophils Relative 3 %   Eosinophils Absolute 0.2 0.0 - 0.5 K/uL   Basophils Relative 1 %   Basophils Absolute 0.1 0.0 - 0.1 K/uL   Immature Granulocytes 1 %   Abs Immature Granulocytes 0.04 0.00 - 0.07 K/uL  Comprehensive metabolic panel  Result Value Ref Range   Sodium 140 135 - 145 mmol/L   Potassium 3.5 3.5 - 5.1 mmol/L   Chloride 102 98 - 111 mmol/L   CO2 25 22 - 32 mmol/L   Glucose, Bld 114 (H) 70 - 99 mg/dL   BUN 17 6 - 20 mg/dL   Creatinine, Ser 1.01 0.61 - 1.24 mg/dL   Calcium 9.4 8.9 - 10.3 mg/dL   Total Protein 8.3 (H) 6.5 - 8.1 g/dL   Albumin 4.1 3.5 - 5.0 g/dL   AST 24 15 - 41 U/L   ALT 21 0 - 44 U/L   Alkaline Phosphatase 79 38 - 126 U/L   Total Bilirubin 0.8 0.3 - 1.2 mg/dL   GFR calc non Af Amer >60 >60 mL/min   GFR calc Af Amer >60 >60 mL/min   Anion gap 13 5 - 15  Lipase, blood   Result Value Ref Range   Lipase 29 11 - 51 U/L  Urinalysis, Complete w Microscopic  Result Value Ref Range   Color, Urine STRAW (A) YELLOW   APPearance CLEAR (A) CLEAR   Specific Gravity, Urine 1.008 1.005 - 1.030   pH 6.0 5.0 - 8.0   Glucose, UA NEGATIVE NEGATIVE mg/dL   Hgb urine dipstick NEGATIVE NEGATIVE   Bilirubin Urine NEGATIVE NEGATIVE   Ketones, ur NEGATIVE NEGATIVE mg/dL   Protein, ur NEGATIVE NEGATIVE mg/dL   Nitrite NEGATIVE NEGATIVE   Leukocytes,Ua NEGATIVE NEGATIVE   WBC, UA NONE SEEN 0 - 5 WBC/hpf   Bacteria, UA NONE SEEN NONE SEEN   Squamous Epithelial / LPF NONE SEEN 0 - 5   Mucus PRESENT       Assessment & Plan:   Problem List Items Addressed This Visit    None    Visit Diagnoses    Pain in right testicle    -  Primary   U/A benign, exam and vitals benign. Will tx with cipro in case early infection, ice and rest. If not resolving will obtain u/s   Relevant Orders   UA/M w/rflx Culture, Routine       Follow up plan: Return if symptoms worsen or fail to improve.

## 2018-12-10 ENCOUNTER — Other Ambulatory Visit: Payer: Self-pay

## 2018-12-10 DIAGNOSIS — N529 Male erectile dysfunction, unspecified: Secondary | ICD-10-CM

## 2018-12-10 NOTE — Telephone Encounter (Signed)
Needs appointment

## 2018-12-11 NOTE — Telephone Encounter (Signed)
Called pt scheduled him for 11/19 with Apolonio Schneiders

## 2018-12-12 ENCOUNTER — Other Ambulatory Visit: Payer: Self-pay

## 2018-12-13 ENCOUNTER — Other Ambulatory Visit: Payer: Self-pay

## 2018-12-13 ENCOUNTER — Ambulatory Visit: Payer: 59 | Admitting: Family Medicine

## 2018-12-13 ENCOUNTER — Encounter: Payer: Self-pay | Admitting: Family Medicine

## 2018-12-13 VITALS — BP 135/87 | HR 70 | Temp 97.9°F

## 2018-12-13 DIAGNOSIS — N529 Male erectile dysfunction, unspecified: Secondary | ICD-10-CM

## 2018-12-13 DIAGNOSIS — K219 Gastro-esophageal reflux disease without esophagitis: Secondary | ICD-10-CM | POA: Diagnosis not present

## 2018-12-13 DIAGNOSIS — I10 Essential (primary) hypertension: Secondary | ICD-10-CM

## 2018-12-13 MED ORDER — SILDENAFIL CITRATE 20 MG PO TABS: 20.0000 mg | ORAL_TABLET | Freq: Every day | ORAL | 11 refills | Status: DC | PRN

## 2018-12-13 NOTE — Progress Notes (Signed)
BP 135/87   Pulse 70   Temp 97.9 F (36.6 C)   SpO2 99%    Subjective:    Patient ID: Shaun Carney, male    DOB: May 04, 1960, 58 y.o.   MRN: [id number]  HPI: Shaun Carney is a 58 y.o. male  Chief Complaint  Patient presents with  . Hypertension  . Erectile Dysfunction   Patient presenting today for 6 month f/u chronic conditions.   HTN - home readings 130s/80s typically, every once in a while situationally elevated. Taking his medicine faithfully without side effects. Denies CP, SOB, HAs, dizziness. Staying active and trying to eat well.   Taking protonix daily for GERD which seems to keep sxs at [place].   On sildenafil prn for ED, working well without side effects.   Relevant past medical, surgical, family and social history reviewed and updated as indicated. Interim medical history since our last visit reviewed. Allergies and medications reviewed and updated.  Review of Systems  Per HPI unless specifically indicated above     Objective:    BP 135/87   Pulse 70   Temp 97.9 F (36.6 C)   SpO2 99%   Wt Readings from Last 3 Encounters:  02/21/18 162 lb (73.5 kg)  02/15/18 162 lb (73.5 kg)  12/08/17 160 lb 12.8 oz (72.9 kg)    Physical Exam Vitals signs and nursing note reviewed.  Constitutional:      Appearance: Normal appearance.  HENT:     Head: Atraumatic.  Eyes:     Extraocular Movements: Extraocular movements intact.     Conjunctiva/sclera: Conjunctivae normal.  Neck:     Musculoskeletal: Normal range of motion and neck supple.  Cardiovascular:     Rate and Rhythm: Normal rate and regular rhythm.  Pulmonary:     Effort: Pulmonary effort is normal.     Breath sounds: Normal breath sounds.  Musculoskeletal: Normal range of motion.  Skin:    General: Skin is warm and dry.  Neurological:     General: No focal deficit present.     Mental Status: He is oriented to person, place, and time.  Psychiatric:        Mood and Affect: Mood normal.      Thought Content: Thought content normal.        Judgment: Judgment normal.     Results for orders placed or performed in visit on 11/01/18  Microscopic Examination   URINE  Result Value Ref Range   WBC, UA None seen 0 - 5 /hpf   RBC 0-2 0 - 2 /hpf   Epithelial Cells (non renal) 0-10 0 - 10 /hpf   Bacteria, UA None seen None seen/Few  UA/M w/rflx Culture, Routine   Specimen: Urine   URINE  Result Value Ref Range   Specific Gravity, UA 1.020 1.005 - 1.030   pH, UA 7.0 5.0 - 7.5   Color, UA Yellow Yellow   Appearance Ur Clear Clear   Leukocytes,UA Negative Negative   Protein,UA Negative Negative/Trace   Glucose, UA Negative Negative   Ketones, UA Negative Negative   RBC, UA Trace (A) Negative   Bilirubin, UA Negative Negative   Urobilinogen, Ur 1.0 0.2 - 1.0 mg/dL   Nitrite, UA Negative Negative   Microscopic Examination See below:       Assessment & Plan:   Problem List Items Addressed This Visit      Cardiovascular and Mediastinum   Essential hypertension - Primary    BPs stable and  WNL, continue current regimen      Relevant Medications   sildenafil (REVATIO) 20 MG tablet     Digestive   GERD (gastroesophageal reflux disease)    Stable and under good control, continue current regimen        Other   ED (erectile dysfunction)    Stable and under good control, continue current regimen      Relevant Medications   sildenafil (REVATIO) 20 MG tablet       Follow up plan: Return in about 6 months (around 06/12/2019) for CPE.

## 2018-12-13 NOTE — Assessment & Plan Note (Signed)
BPs stable and WNL, continue current regimen 

## 2018-12-13 NOTE — Assessment & Plan Note (Signed)
Stable and under good control, continue current regimen 

## 2019-01-21 IMAGING — CT CT ABD-PELV W/ CM
2 of 5 series · 16 of 46 positions shown, 18 images · IV contrast (iopamidol)
Comparison: Ultrasound from 06/01/2013

CLINICAL DATA: Abdominal distension

EXAM:
CT ABDOMEN AND PELVIS WITH CONTRAST
TECHNIQUE: Multidetector CT imaging of the abdomen and pelvis was performed
using the standard protocol following bolus administration of
intravenous contrast.
CONTRAST:  100mL 12OCHI-7AA IOPAMIDOL (12OCHI-7AA) INJECTION 61%

[Series 2: abd pelvis · axial · 0.71mm/px · z∈[-1588,-1138]mm · 13 of 102 slices shown, 15 images (1 of 2)]
[im 6/102  soft-tissue]
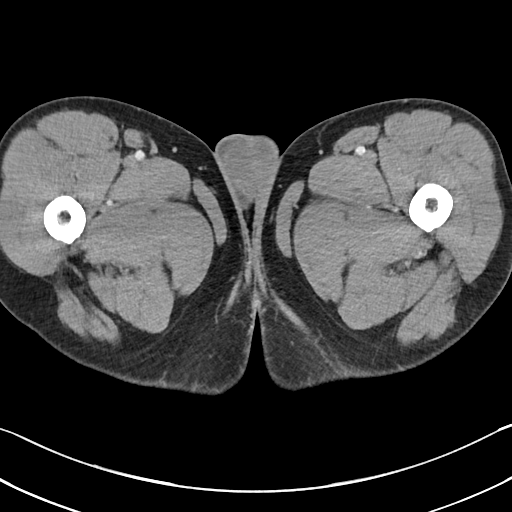
[im 6/102  bone]
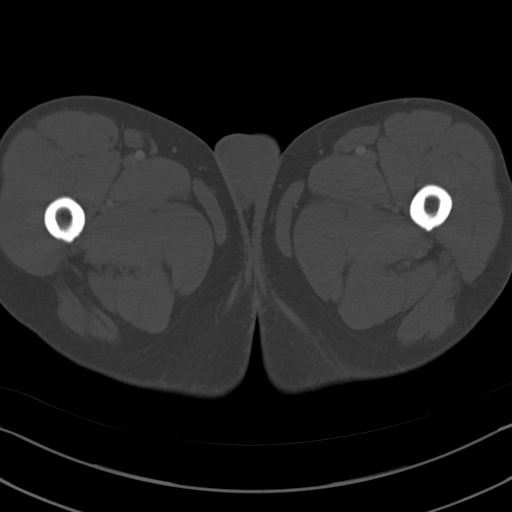
[im 16/102  soft-tissue]
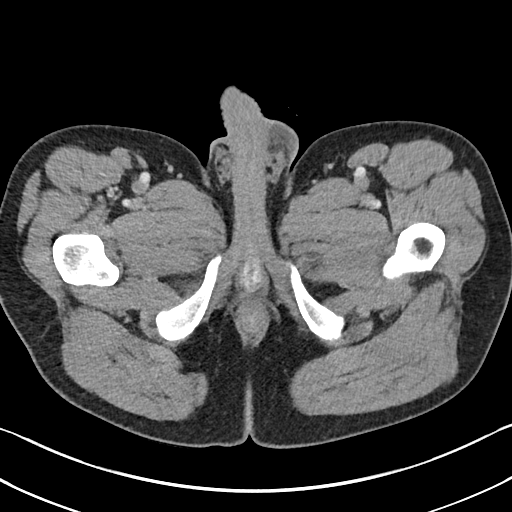
[im 22/102  soft-tissue]
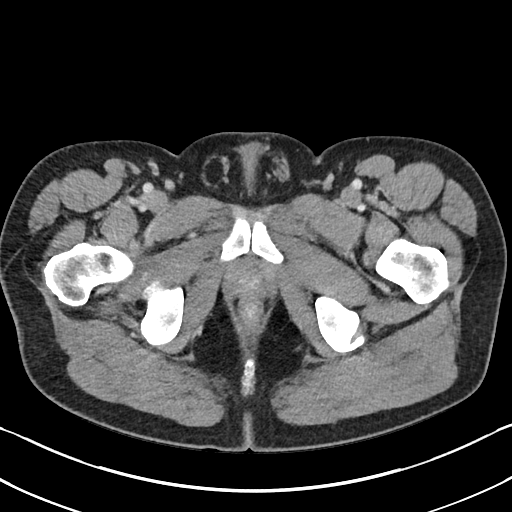
[im 27/102  soft-tissue]
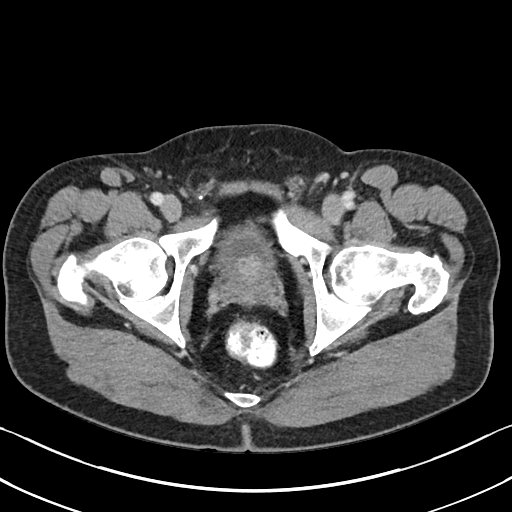
[im 38/102  soft-tissue]
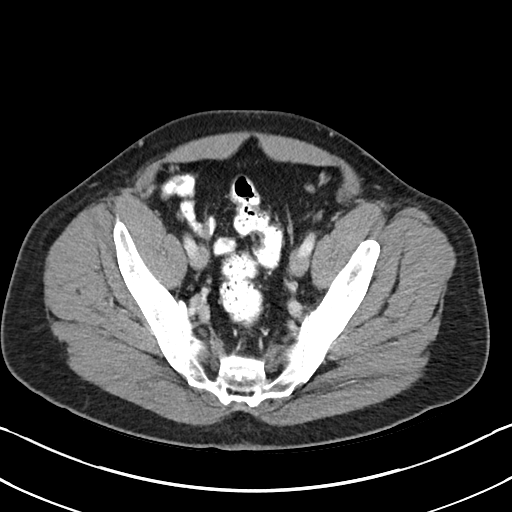
[im 43/102  soft-tissue]
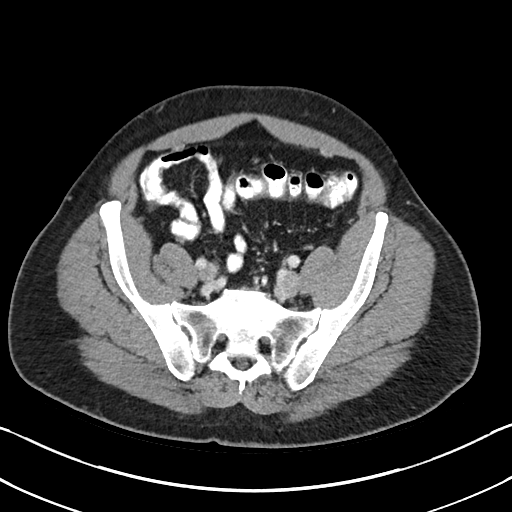
[im 54/102  soft-tissue]
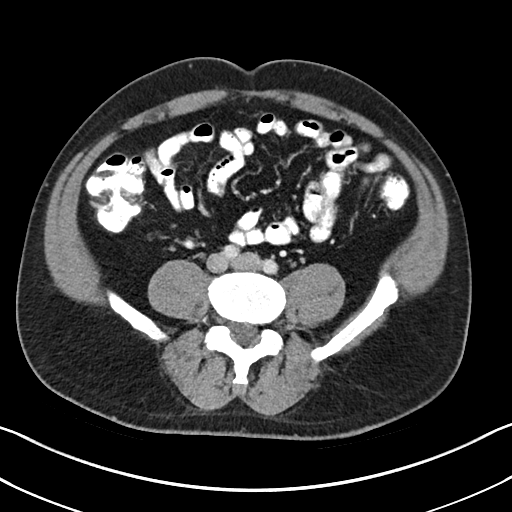
[im 59/102  soft-tissue]
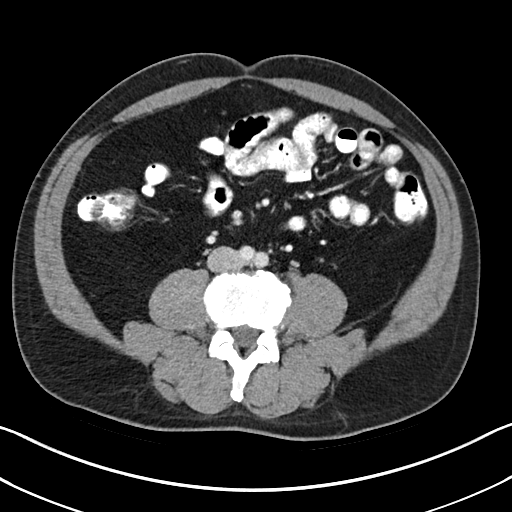
[im 64/102  soft-tissue]
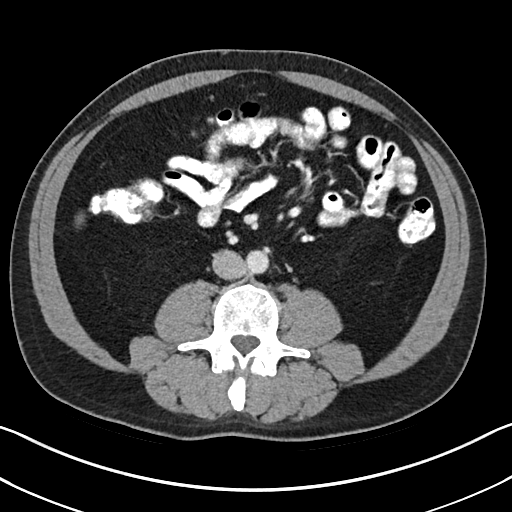
[im 64/102  bone]
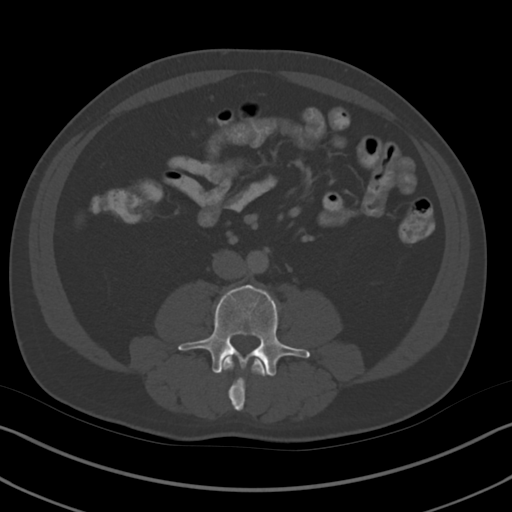
[im 75/102  soft-tissue]
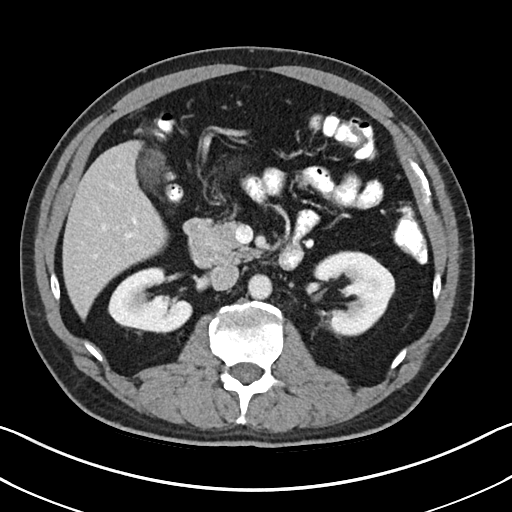
[im 80/102  soft-tissue]
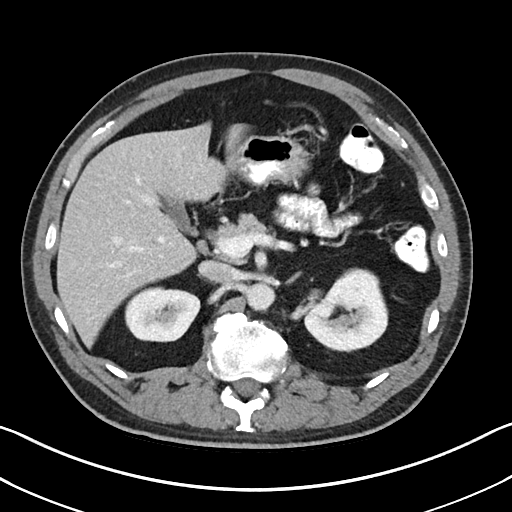
[im 86/102  soft-tissue]
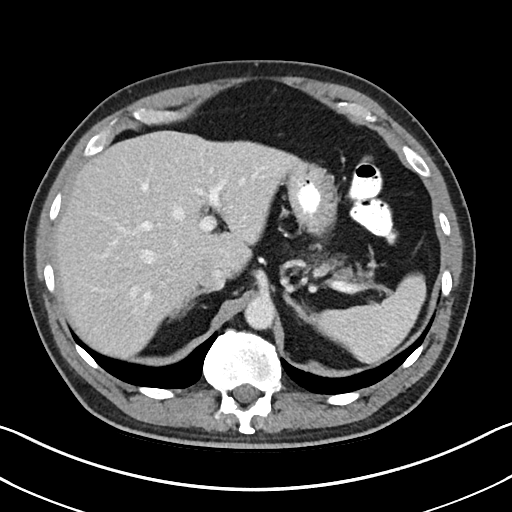
[im 96/102  soft-tissue]
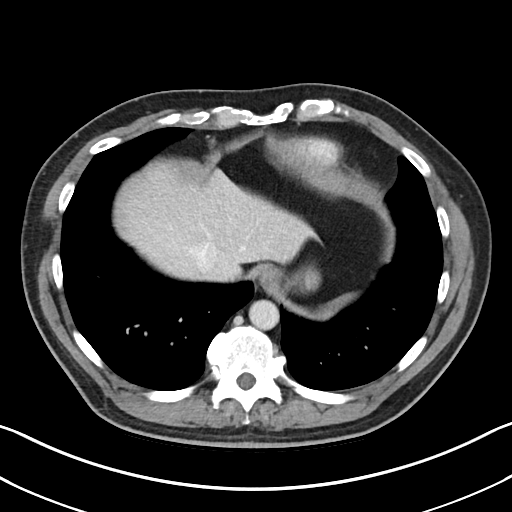

[Series 4: abd pelvis · coronal · 0.71mm/px · 3 of 144 slices shown (2 of 2)]
[im 48/144  soft-tissue]
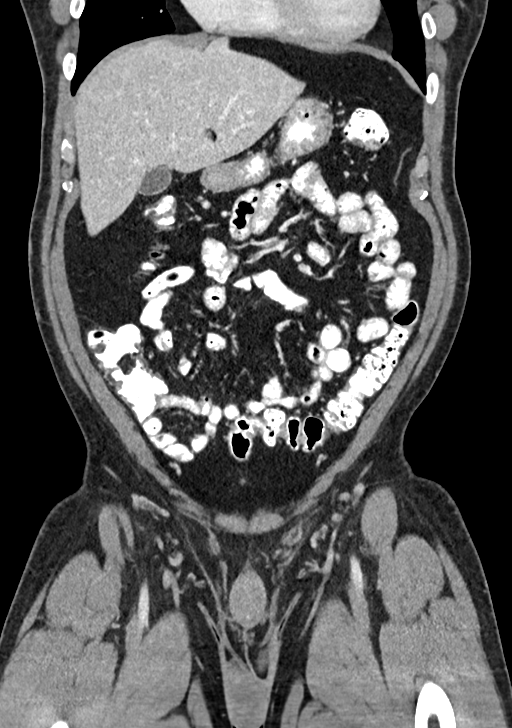
[im 64/144  soft-tissue]
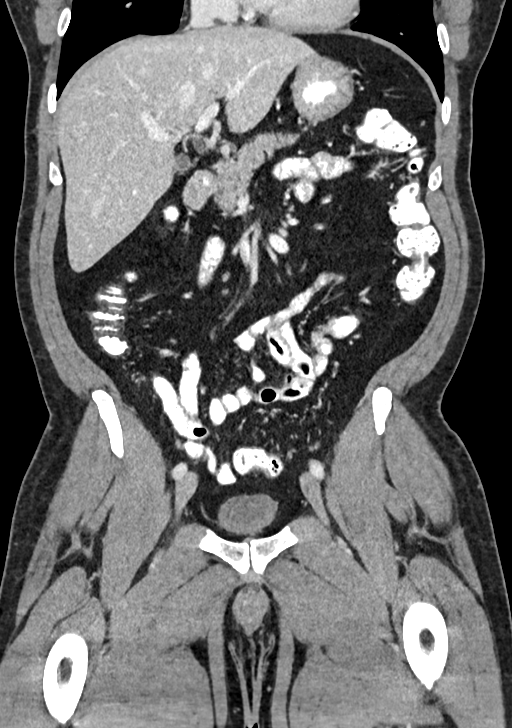
[im 80/144  soft-tissue]
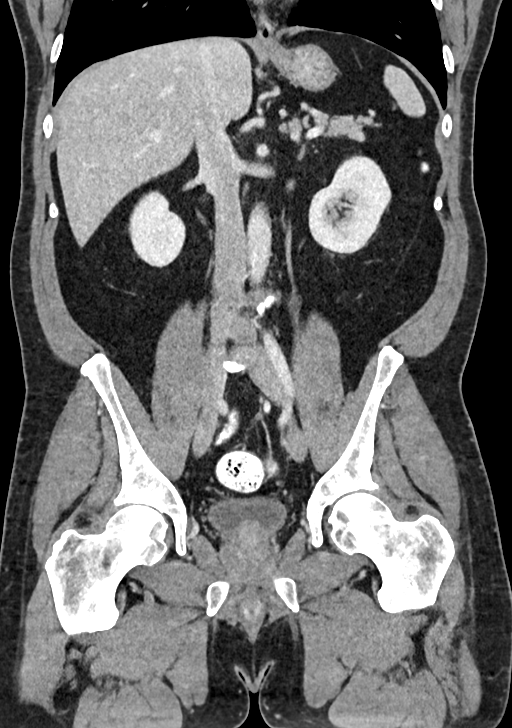

[16 of 46 positions shown; findings below may reference images not displayed]

FINDINGS: Lower chest: No acute abnormality.

Hepatobiliary: No focal liver abnormality is seen. No gallstones,
gallbladder wall thickening, or biliary dilatation.

Pancreas: Unremarkable. No pancreatic ductal dilatation or
surrounding inflammatory changes.

Spleen: Normal in size without focal abnormality.

Adrenals/Urinary Tract: Adrenal glands are within normal limits
bilaterally. Kidneys are within normal limits without evidence of
urinary tract obstructive change or focal mass. The gallbladder is
decompressed.

Stomach/Bowel: Stomach is within normal limits. Appendix appears
normal. No evidence of bowel wall thickening, distention, or
inflammatory changes.

Vascular/Lymphatic: No significant vascular findings are present. No
enlarged abdominal or pelvic lymph nodes.

Reproductive: Prostate is unremarkable.

Other: No abdominal wall hernia or abnormality. No abdominopelvic
ascites.

Musculoskeletal: No acute or significant osseous findings.
IMPRESSION: No acute abnormality noted.

## 2019-03-15 ENCOUNTER — Other Ambulatory Visit: Payer: Self-pay

## 2019-03-15 DIAGNOSIS — N529 Male erectile dysfunction, unspecified: Secondary | ICD-10-CM

## 2019-03-15 DIAGNOSIS — I10 Essential (primary) hypertension: Secondary | ICD-10-CM

## 2019-03-15 MED ORDER — SILDENAFIL CITRATE 20 MG PO TABS: 20.0000 mg | ORAL_TABLET | Freq: Every day | ORAL | 11 refills | Status: DC | PRN

## 2019-03-15 MED ORDER — HYDROCHLOROTHIAZIDE 25 MG PO TABS: ORAL_TABLET | ORAL | 3 refills | Status: DC

## 2019-03-15 MED ORDER — AMLODIPINE BESYLATE 10 MG PO TABS: ORAL_TABLET | ORAL | 1 refills | Status: DC

## 2019-03-15 MED ORDER — BENAZEPRIL HCL 40 MG PO TABS: ORAL_TABLET | ORAL | 1 refills | Status: DC

## 2019-03-15 NOTE — Telephone Encounter (Signed)
Fax from [facility] for refills on sildenafil 20 mg tab, hydrochlorothiazide 25 mg tab, benazepril 40 mg tab, amlodipine 10 mg tab

## 2019-04-27 ENCOUNTER — Ambulatory Visit: Payer: Self-pay | Attending: Internal Medicine

## 2019-04-27 DIAGNOSIS — Z23 Encounter for immunization: Secondary | ICD-10-CM

## 2019-04-27 NOTE — Progress Notes (Signed)
   Covid-19 Vaccination Clinic  Name:  Shaun Carney    MRN: [id number] DOB: February 02, 1960  04/27/2019  Mr. Shaun Carney was observed post Covid-19 immunization for 15 minutes without incident. He was provided with Vaccine Information Sheet and instruction to access the V-Safe system.   Mr. Shaun Carney was instructed to call 911 with any severe reactions post vaccine: Shaun Carney Kitchen Difficulty breathing  . Swelling of face and throat  . A fast heartbeat  . A bad rash all over body  . Dizziness and weakness   Immunizations Administered    Name Date Dose VIS Date Route   Pfizer COVID-19 Vaccine 04/27/2019  9:09 AM 0.3 mL 01/04/2019 Intramuscular   Manufacturer: [place]   Lot: [id number]   [place]: [id number]

## 2019-05-22 ENCOUNTER — Ambulatory Visit: Payer: Self-pay

## 2019-05-25 ENCOUNTER — Ambulatory Visit: Payer: Self-pay | Attending: Internal Medicine

## 2019-05-25 DIAGNOSIS — Z23 Encounter for immunization: Secondary | ICD-10-CM

## 2019-05-25 NOTE — Progress Notes (Signed)
   Covid-19 Vaccination Clinic  Name:  Shaun Carney    MRN: [id number] DOB: 06/10/1960  05/25/2019  Shaun Carney was observed post Covid-19 immunization for 15 minutes without incident. He was provided with Vaccine Information Sheet and instruction to access the V-Safe system.   Shaun Carney was instructed to call 911 with any severe reactions post vaccine: [place] Kitchen Difficulty breathing  . Swelling of face and throat  . A fast heartbeat  . A bad rash all over body  . Dizziness and weakness   Immunizations Administered    Name Date Dose VIS Date Route   Pfizer COVID-19 Vaccine 05/25/2019 11:01 AM 0.3 mL 03/20/2018 Intramuscular   Manufacturer: [place]   Lot: [id number]   [place]: [id number]

## 2019-06-01 ENCOUNTER — Ambulatory Visit: Payer: Self-pay

## 2019-07-05 ENCOUNTER — Other Ambulatory Visit: Payer: Self-pay

## 2019-07-05 DIAGNOSIS — K219 Gastro-esophageal reflux disease without esophagitis: Secondary | ICD-10-CM

## 2019-07-05 MED ORDER — PANTOPRAZOLE SODIUM 40 MG PO TBEC: DELAYED_RELEASE_TABLET | ORAL | 10 refills | Status: DC

## 2019-07-05 NOTE — Telephone Encounter (Signed)
Patient is a Administrator and works long hours, he will try to schedule a CPE in the next few months.

## 2019-07-16 ENCOUNTER — Ambulatory Visit: Payer: BC Managed Care – PPO | Admitting: Nurse Practitioner

## 2019-07-16 ENCOUNTER — Encounter: Payer: Self-pay | Admitting: Nurse Practitioner

## 2019-07-16 ENCOUNTER — Other Ambulatory Visit: Payer: Self-pay

## 2019-07-16 VITALS — BP 115/64 | HR 69 | Temp 98.2°F | Ht 67.0 in | Wt 154.0 lb

## 2019-07-16 DIAGNOSIS — B029 Zoster without complications: Secondary | ICD-10-CM

## 2019-07-16 MED ORDER — VALACYCLOVIR HCL 1 G PO TABS: 1000.0000 mg | ORAL_TABLET | Freq: Three times a day (TID) | ORAL | 0 refills | Status: AC

## 2019-07-16 NOTE — Progress Notes (Signed)
BP 115/64 (BP Location: Right Arm, Patient Position: Sitting, Cuff Size: Normal)   Pulse 69   Temp 98.2 F (36.8 C) (Oral)   Ht 5\' 7"  (1.702 m)   Wt 154 lb (69.9 kg)   SpO2 97%   BMI 24.12 kg/m    Subjective:    Patient ID: Shaun Carney, male    DOB: March 01, 1960, 59 y.o.   MRN: [id number]  HPI: Shaun Carney is a 59 y.o. male presenting with a rash on his back.  Chief Complaint  Patient presents with  . Herpes Zoster    Possible shingles on back. Ongoing appx 4 days.    SHINGLES Patient reports he first noticed he was having soreness on his upper left back starting on Friday.  His wife put icy hot on it and it turned extremely painful and burned. Duration: days Location: upper left pain Painful:  yes Severity: severe  Paresthesia:  yes Hyperesthesia: yes Itching:  yes Burning:  yes Oozing:  no Blisters:  yes Fevers:  no History of the same: has had shingles in the past 2020 Alleviating factors: has tried icy hot without relief Status: worse Treatments attempted: icy hot without relief.   No Known Allergies  Outpatient Encounter Medications as of 07/16/2019  Medication Sig  . amLODipine (NORVASC) 10 MG tablet TAKE 1 TABLET(10 MG) BY MOUTH DAILY  . benazepril (LOTENSIN) 40 MG tablet TAKE 1 TABLET(40 MG) BY MOUTH DAILY  . fluticasone (FLONASE) 50 MCG/ACT nasal spray SHAKE LIQUID AND USE 2 SPRAYS IN EACH NOSTRIL EVERY DAY  . hydrochlorothiazide (HYDRODIURIL) 25 MG tablet TAKE 1 TABLET(25 MG) BY MOUTH DAILY  . pantoprazole (PROTONIX) 40 MG tablet TAKE 1 TABLET(40 MG) BY MOUTH DAILY  . sildenafil (REVATIO) 20 MG tablet Take 1 tablet (20 mg total) by mouth daily as needed.  . valACYclovir (VALTREX) 1000 MG tablet Take 1 tablet (1,000 mg total) by mouth 3 (three) times daily for 7 days.   No facility-administered encounter medications on file as of 07/16/2019.   Patient Active Problem List   Diagnosis Date Noted  . Herpes zoster without complication 61/95/0932  . GERD  (gastroesophageal reflux disease)   . COVID-19 virus infection 06/25/2018  . Plantar fasciitis 12/27/2017  . Chondromalacia patellae 01/18/2017  . ED (erectile dysfunction) 06/10/2015  . Essential hypertension 05/13/2015   Past Medical History:  Diagnosis Date  . Anxiety   . GERD (gastroesophageal reflux disease)   . Hypertension    Relevant past medical, surgical, family and social history reviewed and updated as indicated. Interim medical history since our last visit reviewed. Allergies and medications reviewed and updated.  Review of Systems  Constitutional: Negative.  Negative for activity change, appetite change and fever.  Musculoskeletal: Negative.  Negative for arthralgias, gait problem and myalgias.  Skin: Positive for rash. Negative for color change and pallor.  Neurological: Positive for numbness. Negative for dizziness, weakness, light-headedness and headaches.  Psychiatric/Behavioral: Negative.  Negative for confusion and sleep disturbance.    Per HPI unless specifically indicated above     Objective:    BP 115/64 (BP Location: Right Arm, Patient Position: Sitting, Cuff Size: Normal)   Pulse 69   Temp 98.2 F (36.8 C) (Oral)   Ht 5\' 7"  (1.702 m)   Wt 154 lb (69.9 kg)   SpO2 97%   BMI 24.12 kg/m   Wt Readings from Last 3 Encounters:  07/16/19 154 lb (69.9 kg)  02/21/18 162 lb (73.5 kg)  02/15/18 162 lb (73.5 kg)  Physical Exam Vitals and nursing note reviewed.  Constitutional:      General: He is not in acute distress.    Appearance: Normal appearance. He is not toxic-appearing.  Skin:    General: Skin is warm and dry.     Coloration: Skin is not jaundiced or pale.     Findings: Lesion (small flesh-colored papules to left upper back, extremely tender to touch, no blistering or lesions noted to area) present. No rash.  Neurological:     Mental Status: He is alert and oriented to person, place, and time.     Motor: No weakness.     Gait: Gait normal.   Psychiatric:        Mood and Affect: Mood normal.        Behavior: Behavior normal.        Thought Content: Thought content normal.        Judgment: Judgment normal.       Assessment & Plan:   Problem List Items Addressed This Visit      Other   Herpes zoster without complication - Primary    Acute, ongoing.  No rash or blistering noted on examination, however given history of herpes zoster and extremely sensitive to touch in upper back area on examination, will treat with valacyclovir 1,000 mg tid x 7 days.  Patient advised to return to clinic if pain does not improve with antivirals.      Relevant Medications   valACYclovir (VALTREX) 1000 MG tablet       Follow up plan: Return if symptoms worsen or fail to improve.

## 2019-07-16 NOTE — Patient Instructions (Signed)
Shingles  Shingles is an infection. It gives you a painful skin rash and blisters that have fluid in them. Shingles is caused by the same germ (virus) that causes chickenpox. Shingles only happens in people who:  Have had chickenpox.  Have been given a shot of medicine (vaccine) to protect against chickenpox. Shingles is rare in this group. The first symptoms of shingles may be itching, tingling, or pain in an area on your skin. A rash will show on your skin a few days or weeks later. The rash is likely to be on one side of your body. The rash usually has a shape like a belt or a band. Over time, the rash turns into fluid-filled blisters. The blisters will break open, change into scabs, and dry up. Medicines may:  Help with pain and itching.  Help you get better sooner.  Help to prevent long-term problems. Follow these instructions at home: Medicines  Take over-the-counter and prescription medicines only as told by your doctor.  Put on an anti-itch cream or numbing cream where you have a rash, blisters, or scabs. Do this as told by your doctor. Helping with itching and discomfort   Put cold, wet cloths (cold compresses) on the area of the rash or blisters as told by your doctor.  Cool baths can help you feel better. Try adding baking soda or dry oatmeal to the water to lessen itching. Do not bathe in hot water. Blister and rash care  Keep your rash covered with a loose bandage (dressing).  Wear loose clothing that does not rub on your rash.  Keep your rash and blisters clean. To do this, wash the area with mild soap and cool water as told by your doctor.  Check your rash every day for signs of infection. Check for: ? More redness, swelling, or pain. ? Fluid or blood. ? Warmth. ? Pus or a bad smell.  Do not scratch your rash. Do not pick at your blisters. To help you to not scratch: ? Keep your fingernails clean and cut short. ? Wear gloves or mittens when you sleep, if  scratching is a problem. General instructions  Rest as told by your doctor.  Keep all follow-up visits as told by your doctor. This is important.  Wash your hands often with soap and water. If soap and water are not available, use hand sanitizer. Doing this lowers your chance of getting a skin infection caused by germs (bacteria).  Your infection can cause chickenpox in people who have never had chickenpox or never got a shot of chickenpox vaccine. If you have blisters that did not change into scabs yet, try not to touch other people or be around other people, especially: ? Babies. ? Pregnant women. ? Children who have areas of red, itchy, or rough skin (eczema). ? Very old people who have transplants. ? People who have a long-term (chronic) sickness, like cancer or AIDS. Contact a doctor if:  Your pain does not get better with medicine.  Your pain does not get better after the rash heals.  You have any signs of infection in the rash area. These signs include: ? More redness, swelling, or pain around the rash. ? Fluid or blood coming from the rash. ? The rash area feeling warm to the touch. ? Pus or a bad smell coming from the rash. Get help right away if:  The rash is on your face or nose.  You have pain in your face or pain by   your eye.  You lose feeling on one side of your face.  You have trouble seeing.  You have ear pain, or you have ringing in your ear.  You have a loss of taste.  Your condition gets worse. Summary  Shingles gives you a painful skin rash and blisters that have fluid in them.  Shingles is an infection. It is caused by the same germ (virus) that causes chickenpox.  Keep your rash covered with a loose bandage (dressing). Wear loose clothing that does not rub on your rash.  If you have blisters that did not change into scabs yet, try not to touch other people or be around people. This information is not intended to replace advice given to you by  your health care provider. Make sure you discuss any questions you have with your health care provider. Document Revised: 05/04/2018 Document Reviewed: 09/14/2016 Elsevier Patient Education  2020 [employer]  

## 2019-07-16 NOTE — Assessment & Plan Note (Signed)
Acute, ongoing.  No rash or blistering noted on examination, however given history of herpes zoster and extremely sensitive to touch in upper back area on examination, will treat with valacyclovir 1,000 mg tid x 7 days.  Patient advised to return to clinic if pain does not improve with antivirals.

## 2019-09-05 ENCOUNTER — Other Ambulatory Visit: Payer: Self-pay | Admitting: Nurse Practitioner

## 2019-09-05 DIAGNOSIS — I10 Essential (primary) hypertension: Secondary | ICD-10-CM

## 2019-11-15 ENCOUNTER — Encounter: Payer: Self-pay | Admitting: Nurse Practitioner

## 2019-11-19 ENCOUNTER — Encounter: Payer: Self-pay | Admitting: Nurse Practitioner

## 2019-11-19 ENCOUNTER — Ambulatory Visit: Payer: BC Managed Care – PPO | Admitting: Nurse Practitioner

## 2019-11-19 ENCOUNTER — Other Ambulatory Visit: Payer: Self-pay

## 2019-11-19 VITALS — BP 110/72 | HR 79 | Temp 98.5°F | Resp 16 | Wt 162.6 lb

## 2019-11-19 DIAGNOSIS — K219 Gastro-esophageal reflux disease without esophagitis: Secondary | ICD-10-CM | POA: Diagnosis not present

## 2019-11-19 DIAGNOSIS — Z1211 Encounter for screening for malignant neoplasm of colon: Secondary | ICD-10-CM

## 2019-11-19 DIAGNOSIS — I10 Essential (primary) hypertension: Secondary | ICD-10-CM | POA: Diagnosis not present

## 2019-11-19 DIAGNOSIS — R1084 Generalized abdominal pain: Secondary | ICD-10-CM | POA: Diagnosis not present

## 2019-11-19 LAB — UA/M W/RFLX CULTURE, ROUTINE
Bilirubin, UA: NEGATIVE
Glucose, UA: NEGATIVE
Ketones, UA: NEGATIVE
Leukocytes,UA: NEGATIVE
Nitrite, UA: NEGATIVE
Protein,UA: NEGATIVE
RBC, UA: NEGATIVE
Specific Gravity, UA: 1.02 (ref 1.005–1.030)
Urobilinogen, Ur: 1 mg/dL (ref 0.2–1.0)
pH, UA: 5 (ref 5.0–7.5)

## 2019-11-19 MED ORDER — AMLODIPINE BESYLATE 10 MG PO TABS: ORAL_TABLET | ORAL | 4 refills | Status: DC

## 2019-11-19 MED ORDER — PANTOPRAZOLE SODIUM 40 MG PO TBEC: DELAYED_RELEASE_TABLET | ORAL | 10 refills | Status: DC

## 2019-11-19 MED ORDER — BENAZEPRIL HCL 40 MG PO TABS: ORAL_TABLET | ORAL | 4 refills | Status: DC

## 2019-11-19 NOTE — Progress Notes (Signed)
BP 110/72 (BP Location: Left Arm, Patient Position: Sitting, Cuff Size: Normal)   Pulse 79   Temp 98.5 F (36.9 C) (Oral)   Resp 16   Wt 162 lb 9.6 oz (73.8 kg)   SpO2 98%   BMI 25.47 kg/m    Subjective:    Patient ID: Shaun Carney, male    DOB: November 11, 1960, 59 y.o.   MRN: [id number]  HPI: Shaun Carney is a 58 y.o. male  Chief Complaint  Patient presents with  . Abdominal Pain     one week. patient denies any nausea, vomiting or diarrhea.    ABDOMINAL PAIN  Has had present for one week.  When eats gets a full filling and has discomfort in epigastric area.  Reports this makes "tummy hard".  Has Pantoprazole 40 MG, but does not take this every day.  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  Only drinks beer on weekends.  Non smoker. Duration:weeks Onset: sudden Severity: 6/10 Quality: aching and burning Location:  epigastric  Episode duration:  Radiation: no Frequency: intermittent Alleviating factors:  Aggravating factors: Status: fluctuating Treatments attempted: PPI -- but does not take consistently Fever: no Nausea: no Vomiting: no Weight loss: no Decreased appetite: no Diarrhea: no Constipation: no -- has regular every other day with no straining Blood in stool: no Heartburn: yes Jaundice: no Rash: no Dysuria/urinary frequency: no Hematuria: no History of sexually transmitted disease: no Recurrent NSAID use: no   HYPERTENSION Continues on HCTZ, Amlodipine, and Benazepril. Hypertension status: stable  Satisfied with current treatment? yes Duration of hypertension: chronic BP monitoring frequency:  not checking BP range:  BP medication side effects:  no Medication compliance: good compliance Aspirin: no Recurrent headaches: no Visual changes: no Palpitations: no Dyspnea: no Chest pain: no Lower extremity edema: no Dizzy/lightheaded: no  Relevant past  medical, surgical, family and social history reviewed and updated as indicated. Interim medical history since our last visit reviewed. Allergies and medications reviewed and updated.  Review of Systems  Constitutional: Negative for activity change, diaphoresis, fatigue and fever.  Respiratory: Negative for cough, chest tightness, shortness of breath and wheezing.   Cardiovascular: Negative for chest pain, palpitations and leg swelling.  Gastrointestinal: Positive for abdominal distention (after eating) and abdominal pain. Negative for blood in stool, constipation, diarrhea, nausea, rectal pain and vomiting.  Neurological: Negative.   Psychiatric/Behavioral: Negative.     Per HPI unless specifically indicated above     Objective:    BP 110/72 (BP Location: Left Arm, Patient Position: Sitting, Cuff Size: Normal)   Pulse 79   Temp 98.5 F (36.9 C) (Oral)   Resp 16   Wt 162 lb 9.6 oz (73.8 kg)   SpO2 98%   BMI 25.47 kg/m   Wt Readings from Last 3 Encounters:  11/19/19 162 lb 9.6 oz (73.8 kg)  07/16/19 154 lb (69.9 kg)  02/21/18 162 lb (73.5 kg)    Physical Exam Vitals and nursing note reviewed.  Constitutional:      General: He is awake. He is not in acute distress.    Appearance: He is well-developed and well-groomed. He is not ill-appearing.  HENT:     Head: Normocephalic and atraumatic.     Right Ear: Hearing normal. No drainage.     Left Ear: Hearing normal. No drainage.  Eyes:     General: Lids are normal.  Right eye: No discharge.        Left eye: No discharge.     Conjunctiva/sclera: Conjunctivae normal.     Pupils: Pupils are equal, round, and reactive to light.  Neck:     Thyroid: No thyromegaly.     Vascular: No carotid bruit.     Trachea: Trachea normal.  Cardiovascular:     Rate and Rhythm: Normal rate and regular rhythm.     Heart sounds: Normal heart sounds, S1 normal and S2 normal. No murmur heard.  No gallop.   Pulmonary:     Effort: Pulmonary  effort is normal. No accessory muscle usage or respiratory distress.     Breath sounds: Normal breath sounds.  Abdominal:     General: Bowel sounds are normal. There is no distension.     Palpations: Abdomen is soft. There is no hepatomegaly or splenomegaly.     Tenderness: There is no abdominal tenderness. There is no right CVA tenderness, left CVA tenderness or guarding.     Hernia: No hernia is present.  Musculoskeletal:        General: Normal range of motion.     Cervical back: Normal range of motion and neck supple.     Right lower leg: No edema.     Left lower leg: No edema.  Skin:    General: Skin is warm and dry.     Capillary Refill: Capillary refill takes less than 2 seconds.     Findings: No rash.  Neurological:     Mental Status: He is alert and oriented to person, place, and time.     Deep Tendon Reflexes: Reflexes are normal and symmetric.     Reflex Scores:      Brachioradialis reflexes are 2+ on the right side and 2+ on the left side.      Patellar reflexes are 2+ on the right side and 2+ on the left side. Psychiatric:        Attention and Perception: Attention normal.        Mood and Affect: Mood normal.        Speech: Speech normal.        Behavior: Behavior normal. Behavior is cooperative.        Thought Content: Thought content normal.     Results for orders placed or performed in visit on 11/01/18  Microscopic Examination   URINE  Result Value Ref Range   WBC, UA None seen 0 - 5 /hpf   RBC 0-2 0 - 2 /hpf   Epithelial Cells (non renal) 0-10 0 - 10 /hpf   Bacteria, UA None seen None seen/Few  UA/M w/rflx Culture, Routine   Specimen: Urine   URINE  Result Value Ref Range   Specific Gravity, UA 1.020 1.005 - 1.030   pH, UA 7.0 5.0 - 7.5   Color, UA Yellow Yellow   Appearance Ur Clear Clear   Leukocytes,UA Negative Negative   Protein,UA Negative Negative/Trace   Glucose, UA Negative Negative   Ketones, UA Negative Negative   RBC, UA Trace (A)  Negative   Bilirubin, UA Negative Negative   Urobilinogen, Ur 1.0 0.2 - 1.0 mg/dL   Nitrite, UA Negative Negative   Microscopic Examination See below:       Assessment & Plan:   Problem List Items Addressed This Visit      Cardiovascular and Mediastinum   Essential hypertension    Chronic, stable with BP at goal today.  Refills sent in.  Continue current medication regimen and adjust as needed.  Recommend he monitor BP at home a few days a week and document for visits + focus on DASH diet.  CMP, TSH, and lipid panel today.  Return in 6 months.      Relevant Medications   benazepril (LOTENSIN) 40 MG tablet   amLODipine (NORVASC) 10 MG tablet   Other Relevant Orders   Lipid Panel w/o Chol/HDL Ratio     Digestive   GERD (gastroesophageal reflux disease)    Chronic, ongoing, suspect reason for current epigastric pain as is not taking medication consistently.  Recommend he start taking Pantoprazole daily, refill sent in.  Advise him to monitor diet and keep food diary, avoid foods that cause worsening symptoms.  Return to office in 6 weeks.      Relevant Medications   pantoprazole (PROTONIX) 40 MG tablet     Other   Generalized abdominal pain - Primary    Acute, suspect GERD -- is not taking PPI consistently, educated him on this and need to take daily + track foods via diary, avoid those that cause exacerbation of symptoms.  Labs today to include CBC, CMP, TSH, GGT, UA.  No red flag symptoms.  Order for Cologuard, refuses colonoscopy.  Return in 6 weeks for follow-up.      Relevant Orders   Comprehensive metabolic panel   TSH   CBC with Differential/Platelet   Gamma GT   UA/M w/rflx Culture, Routine    Other Visit Diagnoses    Screen for colon cancer       Cologuard order in   Relevant Orders   Cologuard       Follow up plan: Return in about 6 weeks (around 12/31/2019) for Epigastric pain.

## 2019-11-19 NOTE — Assessment & Plan Note (Signed)
Chronic, stable with BP at goal today.  Refills sent in.  Continue current medication regimen and adjust as needed.  Recommend he monitor BP at home a few days a week and document for visits + focus on DASH diet.  CMP, TSH, and lipid panel today.  Return in 6 months.

## 2019-11-19 NOTE — Assessment & Plan Note (Signed)
Chronic, ongoing, suspect reason for current epigastric pain as is not taking medication consistently.  Recommend he start taking Pantoprazole daily, refill sent in.  Advise him to monitor diet and keep food diary, avoid foods that cause worsening symptoms.  Return to office in 6 weeks.

## 2019-11-19 NOTE — Patient Instructions (Signed)

## 2019-11-19 NOTE — Assessment & Plan Note (Signed)
Acute, suspect GERD -- is not taking PPI consistently, educated him on this and need to take daily + track foods via diary, avoid those that cause exacerbation of symptoms.  Labs today to include CBC, CMP, TSH, GGT, UA.  No red flag symptoms.  Order for Cologuard, refuses colonoscopy.  Return in 6 weeks for follow-up.

## 2019-11-20 LAB — COMPREHENSIVE METABOLIC PANEL
ALT: 15 IU/L (ref 0–44)
AST: 18 IU/L (ref 0–40)
Albumin/Globulin Ratio: 1.3 (ref 1.2–2.2)
Albumin: 4.2 g/dL (ref 3.8–4.9)
Alkaline Phosphatase: 96 IU/L (ref 44–121)
BUN/Creatinine Ratio: 19 (ref 10–24)
BUN: 25 mg/dL (ref 8–27)
Bilirubin Total: 0.5 mg/dL (ref 0.0–1.2)
CO2: 25 mmol/L (ref 20–29)
Calcium: 9.1 mg/dL (ref 8.6–10.2)
Chloride: 100 mmol/L (ref 96–106)
Creatinine, Ser: 1.33 mg/dL — ABNORMAL HIGH (ref 0.76–1.27)
GFR calc Af Amer: 67 mL/min/{1.73_m2} (ref >59)
GFR calc non Af Amer: 58 mL/min/{1.73_m2} — ABNORMAL LOW (ref >59)
Globulin, Total: 3.3 g/dL (ref 1.5–4.5)
Glucose: 97 mg/dL (ref 65–99)
Potassium: 4.1 mmol/L (ref 3.5–5.2)
Sodium: 137 mmol/L (ref 134–144)
Total Protein: 7.5 g/dL (ref 6.0–8.5)

## 2019-11-20 LAB — CBC WITH DIFFERENTIAL/PLATELET
Basophils Absolute: 0.1 10*3/uL (ref 0.0–0.2)
Basos: 1 %
EOS (ABSOLUTE): 0.2 10*3/uL (ref 0.0–0.4)
Eos: 3 %
Hematocrit: 44.8 % (ref 37.5–51.0)
Hemoglobin: 14.4 g/dL (ref 13.0–17.7)
Immature Grans (Abs): 0 10*3/uL (ref 0.0–0.1)
Immature Granulocytes: 0 %
Lymphocytes Absolute: 1.6 10*3/uL (ref 0.7–3.1)
Lymphs: 28 %
MCH: 28.8 pg (ref 26.6–33.0)
MCHC: 32.1 g/dL (ref 31.5–35.7)
MCV: 90 fL (ref 79–97)
Monocytes Absolute: 0.6 10*3/uL (ref 0.1–0.9)
Monocytes: 12 %
Neutrophils Absolute: 3.1 10*3/uL (ref 1.4–7.0)
Neutrophils: 56 %
Platelets: 361 10*3/uL (ref 150–450)
RBC: 5 x10E6/uL (ref 4.14–5.80)
RDW: 11.7 % (ref 11.6–15.4)
WBC: 5.5 10*3/uL (ref 3.4–10.8)

## 2019-11-20 LAB — LIPID PANEL W/O CHOL/HDL RATIO
Cholesterol, Total: 235 mg/dL — ABNORMAL HIGH (ref 100–199)
HDL: 42 mg/dL (ref >39)
LDL Chol Calc (NIH): 147 mg/dL — ABNORMAL HIGH (ref 0–99)
Triglycerides: 251 mg/dL — ABNORMAL HIGH (ref 0–149)
VLDL Cholesterol Cal: 46 mg/dL — ABNORMAL HIGH (ref 5–40)

## 2019-11-20 LAB — TSH: TSH: 1.85 u[IU]/mL (ref 0.450–4.500)

## 2019-11-20 LAB — GAMMA GT: GGT: 24 IU/L (ref 0–65)

## 2019-11-20 NOTE — Progress Notes (Signed)
Good morning, please let Mr. Kedzierski know his labs have returned: - Liver and kidney testing look good - Thyroid is normal - CBC shows no infection or anemia - Cholesterol levels are elevated, I would recommend heavy focus on diet (eating less saturated fats and fatty meats) and we will recheck fasting labs next visit.  In future we may need to add on medication for this if ongoing elevation. If any questions please let me know, but overall these labs look good. Keep being awesome!!  Thank you for allowing me to participate in your care. Kindest regards, Jareli Highland

## 2020-01-02 ENCOUNTER — Telehealth: Payer: Self-pay

## 2020-01-02 NOTE — Telephone Encounter (Signed)
Patient notified.  Will check on Cologuard

## 2020-01-02 NOTE — Telephone Encounter (Signed)
Copied from [place] [id number]. Topic: General - Other >> Jan 02, 2020  8:51 AM Keene Breath wrote: Reason for CRM: Patient called to ask the nurse or doctor about the colon test he was supposed to get in the mail over a month ago.  He still has not heard anything yet.  He also would like advice on what he can take OTC for a sore and scratchy throat.  Please call patient to discuss at [phone]

## 2020-01-02 NOTE — Telephone Encounter (Signed)
Cologuard order is in place from 11/19/19.  Can we check on this?  As for scratchy throat I recommend OTC Claritin or Allegra which can postnasal drainage which can cause this and lozenges for sore throat and to keep throat moist, if worsening symptoms recommend appointment.

## 2020-01-02 NOTE — Telephone Encounter (Signed)
Please advise, will place order for cologuard

## 2020-01-03 NOTE — Telephone Encounter (Signed)
New cologuard order placed in your folder for signature

## 2020-03-09 ENCOUNTER — Other Ambulatory Visit: Payer: Self-pay | Admitting: Nurse Practitioner

## 2020-03-09 ENCOUNTER — Telehealth: Payer: Self-pay

## 2020-03-09 DIAGNOSIS — Z1211 Encounter for screening for malignant neoplasm of colon: Secondary | ICD-10-CM

## 2020-03-09 NOTE — Telephone Encounter (Signed)
Copied from [place] [id number]. Topic: Referral - Request for Referral >> Mar 09, 2020  1:55 PM Alanda Slim E wrote: Has patient seen PCP for this complaint? No  *If NO, is insurance requiring patient see PCP for this issue before PCP can refer them? Referral for which specialty: Gertie Fey  Preferred provider/office:  Reason for referral: Colonoscopy / Pt has never had one and was advised it is time to have one/ please call pt about the status of this order/referral

## 2020-03-09 NOTE — Telephone Encounter (Signed)
Called pt to advise that referral has been placed, no answer, left vm

## 2020-03-09 NOTE — Telephone Encounter (Signed)
Referral to GI placed

## 2020-03-12 ENCOUNTER — Telehealth (INDEPENDENT_AMBULATORY_CARE_PROVIDER_SITE_OTHER): Payer: Self-pay | Admitting: Gastroenterology

## 2020-03-12 ENCOUNTER — Other Ambulatory Visit: Payer: Self-pay

## 2020-03-12 DIAGNOSIS — Z1211 Encounter for screening for malignant neoplasm of colon: Secondary | ICD-10-CM

## 2020-03-12 MED ORDER — NA SULFATE-K SULFATE-MG SULF 17.5-3.13-1.6 GM/177ML PO SOLN: 1.0000 | Freq: Once | ORAL | 0 refills | Status: AC

## 2020-03-12 NOTE — Progress Notes (Signed)
Gastroenterology Pre-Procedure Review  Request Date: Friday 04/03/20 Requesting Physician: Dr. Allen Norris  PATIENT REVIEW QUESTIONS: The patient responded to the following health history questions as indicated:    1. Are you having any GI issues? no 2. Do you have a personal history of Polyps? no 3. Do you have a family history of Colon Cancer or Polyps? no 4. Diabetes Mellitus? no 5. Joint replacements in the past 12 months?no 6. Major health problems in the past 3 months?no 7. Any artificial heart valves, MVP, or defibrillator?no    MEDICATIONS & ALLERGIES:    Patient reports the following regarding taking any anticoagulation/antiplatelet therapy:   Plavix, Coumadin, Eliquis, Xarelto, Lovenox, Pradaxa, Brilinta, or Effient? no Aspirin? no  Patient confirms/reports the following medications:  Current Outpatient Medications  Medication Sig Dispense Refill  . amLODipine (NORVASC) 10 MG tablet Take 1 tablet by mouth once daily 90 tablet 4  . benazepril (LOTENSIN) 40 MG tablet Take 1 tablet by mouth once daily 90 tablet 4  . fluticasone (FLONASE) 50 MCG/ACT nasal spray SHAKE LIQUID AND USE 2 SPRAYS IN EACH NOSTRIL EVERY DAY 16 g 0  . hydrochlorothiazide (HYDRODIURIL) 25 MG tablet TAKE 1 TABLET(25 MG) BY MOUTH DAILY 90 tablet 3  . Na Sulfate-K Sulfate-Mg Sulf 17.5-3.13-1.6 GM/177ML SOLN Take 1 kit by mouth once for 1 dose. 354 mL 0  . pantoprazole (PROTONIX) 40 MG tablet TAKE 1 TABLET(40 MG) BY MOUTH DAILY 30 tablet 10  . sildenafil (REVATIO) 20 MG tablet Take 1 tablet (20 mg total) by mouth daily as needed. 50 tablet 11   No current facility-administered medications for this visit.    Patient confirms/reports the following allergies:  No Known Allergies  No orders of the defined types were placed in this encounter.   AUTHORIZATION INFORMATION Primary Insurance: 1D#: Group #:  Secondary Insurance: 1D#: Group #:  SCHEDULE INFORMATION: Date: 04/03/20 Time: Location:[facility]

## 2020-03-12 NOTE — Telephone Encounter (Signed)
Called pt he states he is suppose to get a call later today from them

## 2020-03-15 ENCOUNTER — Other Ambulatory Visit: Payer: Self-pay | Admitting: Nurse Practitioner

## 2020-03-15 DIAGNOSIS — N529 Male erectile dysfunction, unspecified: Secondary | ICD-10-CM

## 2020-03-19 ENCOUNTER — Encounter: Payer: Self-pay | Admitting: Family Medicine

## 2020-03-19 ENCOUNTER — Ambulatory Visit: Payer: BC Managed Care – PPO | Admitting: Family Medicine

## 2020-03-19 ENCOUNTER — Other Ambulatory Visit: Payer: Self-pay

## 2020-03-19 VITALS — BP 138/79 | HR 66 | Temp 97.8°F | Wt 160.8 lb

## 2020-03-19 DIAGNOSIS — R1013 Epigastric pain: Secondary | ICD-10-CM

## 2020-03-19 DIAGNOSIS — B354 Tinea corporis: Secondary | ICD-10-CM | POA: Diagnosis not present

## 2020-03-19 MED ORDER — CLOTRIMAZOLE-BETAMETHASONE 1-0.05 % EX CREA: 1.0000 "application " | TOPICAL_CREAM | Freq: Two times a day (BID) | CUTANEOUS | 0 refills | Status: DC

## 2020-03-19 MED ORDER — SUCRALFATE 1 G PO TABS: 1.0000 g | ORAL_TABLET | Freq: Three times a day (TID) | ORAL | 3 refills | Status: DC

## 2020-03-19 NOTE — Progress Notes (Signed)
BP 138/79   Pulse 66   Temp 97.8 F (36.6 C)   Wt 160 lb 12.8 oz (72.9 kg)   SpO2 98%   BMI 25.18 kg/m    Subjective:    Patient ID: Shaun Carney, male    DOB: 10-15-1960, 60 y.o.   MRN: [id number]  HPI: Shaun Carney is a 60 y.o. male  Chief Complaint  Patient presents with  . Abdominal Pain    Patient states he has upper abdominal pain after some meals, states its been happening for about 1 to 2 weeks.    ABDOMINAL PAIN  Duration: 2 weeks Onset: gradual Severity: moderate Quality: "pain" Location:  epigastric  Episode duration: about an hour Radiation: no Frequency: intermittent Alleviating factors: eating Aggravating factors: worse with an empty stomach Status: fluctuating Treatments attempted: PPI Fever: no Nausea: no Vomiting: no Weight loss: no Decreased appetite: no Diarrhea: no Constipation: no Blood in stool: no Heartburn: no Jaundice: no Rash: no Dysuria/urinary frequency: no Hematuria: no History of sexually transmitted disease: no Recurrent NSAID use: no  Relevant past medical, surgical, family and social history reviewed and updated as indicated. Interim medical history since our last visit reviewed. Allergies and medications reviewed and updated.  Review of Systems  Respiratory: Negative.   Gastrointestinal: Positive for abdominal pain. Negative for abdominal distention, anal bleeding, blood in stool, constipation, diarrhea, nausea, rectal pain and vomiting.  Musculoskeletal: Negative.   Skin: Negative.   Psychiatric/Behavioral: Negative.     Per HPI unless specifically indicated above     Objective:    BP 138/79   Pulse 66   Temp 97.8 F (36.6 C)   Wt 160 lb 12.8 oz (72.9 kg)   SpO2 98%   BMI 25.18 kg/m   Wt Readings from Last 3 Encounters:  03/19/20 160 lb 12.8 oz (72.9 kg)  11/19/19 162 lb 9.6 oz (73.8 kg)  07/16/19 154 lb (69.9 kg)    Physical Exam Vitals and nursing note reviewed.  Constitutional:      General: He  is not in acute distress.    Appearance: Normal appearance. He is not ill-appearing, toxic-appearing or diaphoretic.  HENT:     Head: Normocephalic and atraumatic.     Right Ear: External ear normal.     Left Ear: External ear normal.     Nose: Nose normal.     Mouth/Throat:     Mouth: Mucous membranes are moist.     Pharynx: Oropharynx is clear.  Eyes:     General: No scleral icterus.       Right eye: No discharge.        Left eye: No discharge.     Extraocular Movements: Extraocular movements intact.     Conjunctiva/sclera: Conjunctivae normal.     Pupils: Pupils are equal, round, and reactive to light.  Cardiovascular:     Rate and Rhythm: Normal rate and regular rhythm.     Pulses: Normal pulses.     Heart sounds: Normal heart sounds. No murmur heard. No friction rub. No gallop.   Pulmonary:     Effort: Pulmonary effort is normal. No respiratory distress.     Breath sounds: Normal breath sounds. No stridor. No wheezing, rhonchi or rales.  Chest:     Chest wall: No tenderness.  Abdominal:     General: Abdomen is flat. Bowel sounds are normal. There is no distension or abdominal bruit. There are no signs of injury.     Palpations: Abdomen is soft. There  is no shifting dullness, fluid wave, hepatomegaly, splenomegaly, mass or pulsatile mass.     Tenderness: There is abdominal tenderness in the epigastric area.  Musculoskeletal:        General: Normal range of motion.     Cervical back: Normal range of motion and neck supple.  Skin:    General: Skin is warm and dry.     Capillary Refill: Capillary refill takes less than 2 seconds.     Coloration: Skin is not jaundiced or pale.     Findings: Rash (pustular rash on back of L knee) present. No bruising, erythema or lesion.  Neurological:     General: No focal deficit present.     Mental Status: He is alert and oriented to person, place, and time. Mental status is at baseline.  Psychiatric:        Mood and Affect: Mood normal.         Behavior: Behavior normal.        Thought Content: Thought content normal.        Judgment: Judgment normal.     Results for orders placed or performed in visit on 11/19/19  Comprehensive metabolic panel  Result Value Ref Range   Glucose 97 65 - 99 mg/dL   BUN 25 8 - 27 mg/dL   Creatinine, Ser 1.33 (H) 0.76 - 1.27 mg/dL   GFR calc non Af Amer 58 (L) >59 mL/min/1.73   GFR calc Af Amer 67 >59 mL/min/1.73   BUN/Creatinine Ratio 19 10 - 24   Sodium 137 134 - 144 mmol/L   Potassium 4.1 3.5 - 5.2 mmol/L   Chloride 100 96 - 106 mmol/L   CO2 25 20 - 29 mmol/L   Calcium 9.1 8.6 - 10.2 mg/dL   Total Protein 7.5 6.0 - 8.5 g/dL   Albumin 4.2 3.8 - 4.9 g/dL   Globulin, Total 3.3 1.5 - 4.5 g/dL   Albumin/Globulin Ratio 1.3 1.2 - 2.2   Bilirubin Total 0.5 0.0 - 1.2 mg/dL   Alkaline Phosphatase 96 44 - 121 IU/L   AST 18 0 - 40 IU/L   ALT 15 0 - 44 IU/L  TSH  Result Value Ref Range   TSH 1.850 0.450 - 4.500 uIU/mL  CBC with Differential/Platelet  Result Value Ref Range   WBC 5.5 3.4 - 10.8 x10E3/uL   RBC 5.00 4.14 - 5.80 x10E6/uL   Hemoglobin 14.4 13.0 - 17.7 g/dL   Hematocrit 44.8 37.5 - 51.0 %   MCV 90 79 - 97 fL   MCH 28.8 26.6 - 33.0 pg   MCHC 32.1 31.5 - 35.7 g/dL   RDW 11.7 11.6 - 15.4 %   Platelets 361 150 - 450 x10E3/uL   Neutrophils 56 Not Estab. %   Lymphs 28 Not Estab. %   Monocytes 12 Not Estab. %   Eos 3 Not Estab. %   Basos 1 Not Estab. %   Neutrophils Absolute 3.1 1.4 - 7.0 x10E3/uL   Lymphocytes Absolute 1.6 0.7 - 3.1 x10E3/uL   Monocytes Absolute 0.6 0.1 - 0.9 x10E3/uL   EOS (ABSOLUTE) 0.2 0.0 - 0.4 x10E3/uL   Basophils Absolute 0.1 0.0 - 0.2 x10E3/uL   Immature Granulocytes 0 Not Estab. %   Immature Grans (Abs) 0.0 0.0 - 0.1 x10E3/uL  Gamma GT  Result Value Ref Range   GGT 24 0 - 65 IU/L  UA/M w/rflx Culture, Routine   Specimen: Urine   Urine  Result Value Ref Range   Specific  Gravity, UA 1.020 1.005 - 1.030   pH, UA 5.0 5.0 - 7.5   Color, UA  Yellow Yellow   Appearance Ur Clear Clear   Leukocytes,UA Negative Negative   Protein,UA Negative Negative/Trace   Glucose, UA Negative Negative   Ketones, UA Negative Negative   RBC, UA Negative Negative   Bilirubin, UA Negative Negative   Urobilinogen, Ur 1.0 0.2 - 1.0 mg/dL   Nitrite, UA Negative Negative  Lipid Panel w/o Chol/HDL Ratio  Result Value Ref Range   Cholesterol, Total 235 (H) 100 - 199 mg/dL   Triglycerides 251 (H) 0 - 149 mg/dL   HDL 42 >39 mg/dL   VLDL Cholesterol Cal 46 (H) 5 - 40 mg/dL   LDL Chol Calc (NIH) 147 (H) 0 - 99 mg/dL      Assessment & Plan:   Problem List Items Addressed This Visit   None   Visit Diagnoses    Epigastric pain    -  Primary   Concern for gastritis. Refused stool test/FOBT- doing colonoscopy next month. Will treat with carafate and take PPI daily. Will reach out to GI. Call w/concerns   Relevant Orders   CBC with Differential/Platelet   Comprehensive metabolic panel   H. pylori antibody, IgG([facility])   Tinea corporis       Will treat with lotrisone. Call if not getting better or getting worse.    Relevant Medications   clotrimazole-betamethasone (LOTRISONE) cream       Follow up plan: Return if symptoms worsen or fail to improve.

## 2020-03-19 NOTE — Patient Instructions (Addendum)
Gastritis, Adult Gastritis is inflammation of the stomach. There are two kinds of gastritis:  Acute gastritis. This kind develops suddenly.  Chronic gastritis. This kind is much more common and lasts for a long time. Gastritis happens when the lining of the stomach becomes weak or gets damaged. Without treatment, gastritis can lead to stomach bleeding and ulcers. What are the causes? This condition may be caused by:  An infection.  Drinking too much alcohol.  Certain medicines. These include steroids, antibiotics, and some over-the-counter medicines, such as aspirin or ibuprofen.  Having too much acid in the stomach.  A disease of the intestines or stomach.  Stress.  An allergic reaction.  Crohn's disease.  Some cancer treatments (radiation). Sometimes the cause of this condition is not known. What are the signs or symptoms? Symptoms of this condition include:  Pain or a burning sensation in the upper abdomen.  Nausea.  Vomiting.  An uncomfortable feeling of fullness after eating.  Weight loss.  Bad breath.  Blood in your vomit or stools. In some cases, there are no symptoms. How is this diagnosed? This condition may be diagnosed with:  Your medical history and a description of your symptoms.  A physical exam.  Tests. These can include: ? Blood tests. ? Stool tests. ? A test in which a thin, flexible instrument with a light and a camera is passed down the esophagus and into the stomach (upper endoscopy). ? A test in which a sample of tissue is taken for testing (biopsy). How is this treated? This condition may be treated with medicines. The medicines that are used vary depending on the cause of the gastritis:  If the condition is caused by a bacterial infection, you may be given antibiotic medicines.  If the condition is caused by too much acid in the stomach, you may be given medicines called H2 blockers, proton pump inhibitors, or antacids. Treatment  may also involve stopping the use of certain medicines, such as aspirin, ibuprofen, or other NSAIDs. Follow these instructions at home: Medicines  Take over-the-counter and prescription medicines only as told by your health care provider.  If you were prescribed an antibiotic medicine, take it as told by your health care provider. Do not stop taking the antibiotic even if you start to feel better. Eating and drinking  Eat small, frequent meals instead of large meals.  Avoid foods and drinks that make your symptoms worse.  Drink enough fluid to keep your urine pale yellow.   Alcohol use  Do not drink alcohol if: ? Your health care provider tells you not to drink. ? You are pregnant, may be pregnant, or are planning to become pregnant.  If you drink alcohol: ? Limit your use to:  0-1 drink a day for women.  0-2 drinks a day for men. ? Be aware of how much alcohol is in your drink. In the [place] one drink equals one 12 oz bottle of beer (355 mL), one 5 oz glass of wine (148 mL), or one 1 oz glass of hard liquor (44 mL). General instructions  Talk with your health care provider about ways to manage stress, such as getting regular exercise or practicing deep breathing, meditation, or yoga.  Do not use any products that contain nicotine or tobacco, such as cigarettes and e-cigarettes. If you need help quitting, ask your health care provider.  Keep all follow-up visits as told by your health care provider. This is important. Contact a health care provider if:  Your symptoms get worse.  Your symptoms return after treatment. Get help right away if:  You vomit blood or material that looks like coffee grounds.  You have black or dark red stools.  You are unable to keep fluids down.  Your abdominal pain gets worse.  You have a fever.  You do not feel better after one week. Summary  Gastritis is inflammation of the lining of the stomach that can occur suddenly (acute) or  develop slowly over time (chronic).  This condition is diagnosed with a medical history, a physical exam, or tests.  This condition may be treated with medicines to treat infection or medicines to reduce the amount of acid in your stomach.  Follow your health care provider's instructions about taking medicines, making changes to your diet, and knowing when to call for help. This information is not intended to replace advice given to you by your health care provider. Make sure you discuss any questions you have with your health care provider. Document Revised: 05/30/2017 Document Reviewed: 05/30/2017 Elsevier Patient Education  [place].

## 2020-03-20 LAB — CBC WITH DIFFERENTIAL/PLATELET
Basophils Absolute: 0.1 10*3/uL (ref 0.0–0.2)
Basos: 1 %
EOS (ABSOLUTE): 0.2 10*3/uL (ref 0.0–0.4)
Eos: 3 %
Hematocrit: 40.4 % (ref 37.5–51.0)
Hemoglobin: 13.8 g/dL (ref 13.0–17.7)
Immature Grans (Abs): 0 10*3/uL (ref 0.0–0.1)
Immature Granulocytes: 1 %
Lymphocytes Absolute: 2.1 10*3/uL (ref 0.7–3.1)
Lymphs: 34 %
MCH: 29.9 pg (ref 26.6–33.0)
MCHC: 34.2 g/dL (ref 31.5–35.7)
MCV: 87 fL (ref 79–97)
Monocytes Absolute: 0.7 10*3/uL (ref 0.1–0.9)
Monocytes: 11 %
Neutrophils Absolute: 3.1 10*3/uL (ref 1.4–7.0)
Neutrophils: 50 %
Platelets: 320 10*3/uL (ref 150–450)
RBC: 4.62 x10E6/uL (ref 4.14–5.80)
RDW: 12.1 % (ref 11.6–15.4)
WBC: 6.2 10*3/uL (ref 3.4–10.8)

## 2020-03-20 LAB — COMPREHENSIVE METABOLIC PANEL
ALT: 17 IU/L (ref 0–44)
AST: 17 IU/L (ref 0–40)
Albumin/Globulin Ratio: 1.3 (ref 1.2–2.2)
Albumin: 4.2 g/dL (ref 3.8–4.9)
Alkaline Phosphatase: 99 IU/L (ref 44–121)
BUN/Creatinine Ratio: 15 (ref 10–24)
BUN: 18 mg/dL (ref 8–27)
Bilirubin Total: 0.4 mg/dL (ref 0.0–1.2)
CO2: 25 mmol/L (ref 20–29)
Calcium: 9.1 mg/dL (ref 8.6–10.2)
Chloride: 101 mmol/L (ref 96–106)
Creatinine, Ser: 1.21 mg/dL (ref 0.76–1.27)
GFR calc Af Amer: 75 mL/min/{1.73_m2} (ref >59)
GFR calc non Af Amer: 65 mL/min/{1.73_m2} (ref >59)
Globulin, Total: 3.3 g/dL (ref 1.5–4.5)
Glucose: 93 mg/dL (ref 65–99)
Potassium: 3.6 mmol/L (ref 3.5–5.2)
Sodium: 141 mmol/L (ref 134–144)
Total Protein: 7.5 g/dL (ref 6.0–8.5)

## 2020-03-20 LAB — H. PYLORI ANTIBODY, IGG: H. pylori, IgG AbS: 0.25 Index Value (ref 0.00–0.79)

## 2020-03-23 ENCOUNTER — Encounter: Payer: Self-pay | Admitting: Family Medicine

## 2020-03-30 ENCOUNTER — Encounter: Payer: Self-pay | Admitting: Gastroenterology

## 2020-03-30 ENCOUNTER — Other Ambulatory Visit: Payer: Self-pay

## 2020-04-01 ENCOUNTER — Other Ambulatory Visit
Admission: RE | Admit: 2020-04-01 | Discharge: 2020-04-01 | Disposition: A | Discharge status: Home / Self Care | Payer: BC Managed Care – PPO | Source: Ambulatory Visit | Attending: Gastroenterology | Admitting: Gastroenterology

## 2020-04-01 ENCOUNTER — Other Ambulatory Visit: Payer: Self-pay

## 2020-04-01 DIAGNOSIS — Z79899 Other long term (current) drug therapy: Secondary | ICD-10-CM | POA: Diagnosis not present

## 2020-04-01 DIAGNOSIS — Z01812 Encounter for preprocedural laboratory examination: Secondary | ICD-10-CM | POA: Insufficient documentation

## 2020-04-01 DIAGNOSIS — K640 First degree hemorrhoids: Secondary | ICD-10-CM | POA: Diagnosis not present

## 2020-04-01 DIAGNOSIS — Z20822 Contact with and (suspected) exposure to covid-19: Secondary | ICD-10-CM | POA: Insufficient documentation

## 2020-04-01 DIAGNOSIS — D120 Benign neoplasm of cecum: Secondary | ICD-10-CM | POA: Diagnosis not present

## 2020-04-01 DIAGNOSIS — R1013 Epigastric pain: Secondary | ICD-10-CM | POA: Diagnosis not present

## 2020-04-01 DIAGNOSIS — Z8616 Personal history of COVID-19: Secondary | ICD-10-CM | POA: Diagnosis not present

## 2020-04-01 DIAGNOSIS — Z1211 Encounter for screening for malignant neoplasm of colon: Secondary | ICD-10-CM | POA: Diagnosis not present

## 2020-04-01 LAB — SARS CORONAVIRUS 2 (TAT 6-24 HRS): SARS Coronavirus 2: NEGATIVE

## 2020-04-02 ENCOUNTER — Ambulatory Visit: Payer: Self-pay | Admitting: *Deleted

## 2020-04-02 NOTE — Telephone Encounter (Signed)
Pt had questions regarding prep for colonoscopy in AM. Answered all questions to pt's satisfaction.   Reason for Disposition . Health Information question, no triage required and triager able to answer question  Protocols used: INFORMATION ONLY CALL - NO TRIAGE-A-AH

## 2020-04-02 NOTE — Discharge Instructions (Signed)

## 2020-04-03 ENCOUNTER — Ambulatory Visit: Payer: BC Managed Care – PPO | Admitting: Anesthesiology

## 2020-04-03 ENCOUNTER — Encounter: Payer: Self-pay | Admitting: Gastroenterology

## 2020-04-03 ENCOUNTER — Encounter
Admission: RE | Disposition: A | Discharge status: Home / Self Care | Payer: Self-pay | Source: Home / Self Care | Attending: Gastroenterology

## 2020-04-03 ENCOUNTER — Ambulatory Visit
Admission: RE | Admit: 2020-04-03 | Discharge: 2020-04-03 | Disposition: A | Discharge status: Home / Self Care | Payer: BC Managed Care – PPO | Attending: Gastroenterology | Admitting: Gastroenterology

## 2020-04-03 ENCOUNTER — Other Ambulatory Visit: Payer: Self-pay

## 2020-04-03 DIAGNOSIS — Z79899 Other long term (current) drug therapy: Secondary | ICD-10-CM | POA: Diagnosis not present

## 2020-04-03 DIAGNOSIS — Z8616 Personal history of COVID-19: Secondary | ICD-10-CM | POA: Diagnosis not present

## 2020-04-03 DIAGNOSIS — Z20822 Contact with and (suspected) exposure to covid-19: Secondary | ICD-10-CM | POA: Diagnosis not present

## 2020-04-03 DIAGNOSIS — Z1211 Encounter for screening for malignant neoplasm of colon: Secondary | ICD-10-CM

## 2020-04-03 DIAGNOSIS — K635 Polyp of colon: Secondary | ICD-10-CM

## 2020-04-03 DIAGNOSIS — K640 First degree hemorrhoids: Secondary | ICD-10-CM | POA: Insufficient documentation

## 2020-04-03 DIAGNOSIS — G8929 Other chronic pain: Secondary | ICD-10-CM | POA: Diagnosis not present

## 2020-04-03 DIAGNOSIS — D120 Benign neoplasm of cecum: Secondary | ICD-10-CM | POA: Diagnosis not present

## 2020-04-03 DIAGNOSIS — R1013 Epigastric pain: Secondary | ICD-10-CM

## 2020-04-03 HISTORY — PX: POLYPECTOMY: SHX5525

## 2020-04-03 HISTORY — PX: ESOPHAGOGASTRODUODENOSCOPY (EGD) WITH PROPOFOL: SHX5813

## 2020-04-03 HISTORY — PX: COLONOSCOPY WITH PROPOFOL: SHX5780

## 2020-04-03 SURGERY — COLONOSCOPY WITH PROPOFOL
Anesthesia: General | Site: Rectum

## 2020-04-03 MED ORDER — SODIUM CHLORIDE 0.9 % IV SOLN: INTRAVENOUS | Status: DC

## 2020-04-03 MED ORDER — STERILE WATER FOR IRRIGATION IR SOLN: Status: DC | PRN

## 2020-04-03 MED ORDER — GLYCOPYRROLATE 0.2 MG/ML IJ SOLN
INTRAMUSCULAR | Status: DC | PRN
  Administered 2020-04-03: .2 mg via INTRAVENOUS

## 2020-04-03 MED ORDER — LACTATED RINGERS IV SOLN: INTRAVENOUS | Status: DC

## 2020-04-03 MED ORDER — PROPOFOL 10 MG/ML IV BOLUS
INTRAVENOUS | Status: DC | PRN
  Administered 2020-04-03: 50 mg via INTRAVENOUS
  Administered 2020-04-03: 100 mg via INTRAVENOUS
  Administered 2020-04-03: 50 mg via INTRAVENOUS
  Administered 2020-04-03: 100 mg via INTRAVENOUS

## 2020-04-03 MED ORDER — LIDOCAINE HCL (CARDIAC) PF 100 MG/5ML IV SOSY
PREFILLED_SYRINGE | INTRAVENOUS | Status: DC | PRN
  Administered 2020-04-03: 80 mg via INTRAVENOUS

## 2020-04-03 SURGICAL SUPPLY — 10 items
BLOCK BITE 60FR ADLT L/F GRN (MISCELLANEOUS) ×3 IMPLANT
FORCEPS BIOP RAD 4 LRG CAP 4 (CUTTING FORCEPS) ×3 IMPLANT
GOWN CVR UNV OPN BCK APRN NK (MISCELLANEOUS) ×4 IMPLANT
GOWN ISOL THUMB LOOP REG UNIV (MISCELLANEOUS) ×6
KIT PRC NS LF DISP ENDO (KITS) ×2 IMPLANT
KIT PROCEDURE OLYMPUS (KITS) ×3
MANIFOLD NEPTUNE II (INSTRUMENTS) ×3 IMPLANT
SNARE COLD EXACTO (MISCELLANEOUS) ×3 IMPLANT
TRAP ETRAP POLY (MISCELLANEOUS) ×3 IMPLANT
WATER STERILE IRR 250ML POUR (IV SOLUTION) ×3 IMPLANT

## 2020-04-03 NOTE — Op Note (Signed)
[facility] Gastroenterology Patient Name: Oron Westrup Procedure Date: 04/03/2020 7:25 AM MRN: [id number] Account #: [id number] Date of Birth: 22-Mar-1960 Admit Type: Outpatient Age: 60 Room: [facility] OR ROOM 01 Gender: Male Note Status: Finalized Procedure:             Upper GI endoscopy Indications:           Epigastric abdominal pain Providers:             Lucilla Lame MD, MD Medicines:             Propofol per Anesthesia Complications:         No immediate complications. Procedure:             Pre-Anesthesia Assessment:                        - Prior to the procedure, a History and Physical was                         performed, and patient medications and allergies were                         reviewed. The patient's tolerance of previous                         anesthesia was also reviewed. The risks and benefits                         of the procedure and the sedation options and risks                         were discussed with the patient. All questions were                         answered, and informed consent was obtained. Prior                         Anticoagulants: The patient has taken no previous                         anticoagulant or antiplatelet agents. ASA Grade                         Assessment: II - A patient with mild systemic disease.                         After reviewing the risks and benefits, the patient                         was deemed in satisfactory condition to undergo the                         procedure.                        After obtaining informed consent, the endoscope was                         passed under direct vision. Throughout the procedure,  the patient's blood pressure, pulse, and oxygen                         saturations were monitored continuously. The was                         introduced through the mouth, and advanced to the                         second part of duodenum. The upper  GI endoscopy was                         accomplished without difficulty. The patient tolerated                         the procedure well. Findings:      The examined esophagus was normal.      The entire examined stomach was normal.      The examined duodenum was normal. Impression:            - Normal esophagus.                        - Normal stomach.                        - Normal examined duodenum.                        - No specimens collected. Recommendation:        - Discharge patient to home.                        - Resume previous diet.                        - Continue present medications.                        - Perform a colonoscopy today. Procedure Code(s):     --- Professional ---                        [phone], Esophagogastroduodenoscopy, flexible,                         transoral; diagnostic, including collection of                         specimen(s) by brushing or washing, when performed                         (separate procedure) Diagnosis Code(s):     --- Professional ---                        R10.13, Epigastric pain CPT copyright 2019 American Medical Association. All rights reserved. The codes documented in this report are preliminary and upon coder review may  be revised to meet current compliance requirements. Lucilla Lame MD, MD 04/03/2020 7:47:39 AM This report has been signed electronically. Number of Addenda: 0 Note Initiated On: 04/03/2020 7:25 AM Estimated Blood Loss:  Estimated blood loss: none.  [facility]

## 2020-04-03 NOTE — Anesthesia Postprocedure Evaluation (Signed)
Anesthesia Post Note  Patient: Shaun Carney  Procedure(s) Performed: COLONOSCOPY WITH BIOPSY (N/A Rectum) ESOPHAGOGASTRODUODENOSCOPY (EGD) WITH PROPOFOL (N/A ) POLYPECTOMY (N/A Rectum)     Patient location during evaluation: PACU Anesthesia Type: General Level of consciousness: awake and alert and oriented Pain management: satisfactory to patient Vital Signs Assessment: post-procedure vital signs reviewed and stable Respiratory status: spontaneous breathing, nonlabored ventilation and respiratory function stable Cardiovascular status: blood pressure returned to baseline and stable Postop Assessment: Adequate PO intake and No signs of nausea or vomiting Anesthetic complications: no   No complications documented.  Raliegh Ip

## 2020-04-03 NOTE — Op Note (Signed)
[facility] Gastroenterology Patient Name: Shaun Carney Procedure Date: 04/03/2020 7:24 AM MRN: [id number] Account #: [id number] Date of Birth: 05-20-1960 Admit Type: Outpatient Age: 60 Room: [facility] OR ROOM 01 Gender: Male Note Status: Finalized Procedure:             Colonoscopy Indications:           Screening for colorectal malignant neoplasm Providers:             Lucilla Lame MD, MD Medicines:             Propofol per Anesthesia Complications:         No immediate complications. Procedure:             Pre-Anesthesia Assessment:                        - Prior to the procedure, a History and Physical was                         performed, and patient medications and allergies were                         reviewed. The patient's tolerance of previous                         anesthesia was also reviewed. The risks and benefits                         of the procedure and the sedation options and risks                         were discussed with the patient. All questions were                         answered, and informed consent was obtained. Prior                         Anticoagulants: The patient has taken no previous                         anticoagulant or antiplatelet agents. ASA Grade                         Assessment: II - A patient with mild systemic disease.                         After reviewing the risks and benefits, the patient                         was deemed in satisfactory condition to undergo the                         procedure.                        After obtaining informed consent, the colonoscope was                         passed under direct vision. Throughout the procedure,  the patient's blood pressure, pulse, and oxygen                         saturations were monitored continuously. The was                         introduced through the anus and advanced to the the                         cecum, identified by  appendiceal orifice and ileocecal                         valve. The colonoscopy was performed without                         difficulty. The patient tolerated the procedure well.                         The quality of the bowel preparation was excellent. Findings:      The perianal and digital rectal examinations were normal.      A 2 mm polyp was found in the cecum. The polyp was sessile. The polyp       was removed with a cold biopsy forceps. Resection and retrieval were       complete.      A 6 mm polyp was found in the cecum. The polyp was sessile. The polyp       was removed with a cold snare. Resection and retrieval were complete.      Non-bleeding internal hemorrhoids were found during retroflexion. The       hemorrhoids were Grade I (internal hemorrhoids that do not prolapse). Impression:            - One 2 mm polyp in the cecum, removed with a cold                         biopsy forceps. Resected and retrieved.                        - One 6 mm polyp in the cecum, removed with a cold                         snare. Resected and retrieved.                        - Non-bleeding internal hemorrhoids. Recommendation:        - Discharge patient to home.                        - Resume previous diet.                        - Continue present medications.                        - Await pathology results.                        - Repeat colonoscopy in 7 years if polyp adenoma and  10 years if hyperplastic Procedure Code(s):     --- Professional ---                        [phone], Colonoscopy, flexible; with removal of                         tumor(s), polyp(s), or other lesion(s) by snare                         technique                        [id number], 59, Colonoscopy, flexible; with biopsy, single                         or multiple Diagnosis Code(s):     --- Professional ---                        Z12.11, Encounter for screening for malignant neoplasm                          of colon                        K63.5, Polyp of colon CPT copyright 2019 American Medical Association. All rights reserved. The codes documented in this report are preliminary and upon coder review may  be revised to meet current compliance requirements. Lucilla Lame MD, MD 04/03/2020 7:59:09 AM This report has been signed electronically. Number of Addenda: 0 Note Initiated On: 04/03/2020 7:24 AM Scope Withdrawal Time: 0 hours 7 minutes 3 seconds  Total Procedure Duration: 0 hours 8 minutes 14 seconds  Estimated Blood Loss:  Estimated blood loss: none.      [facility]

## 2020-04-03 NOTE — H&P (Signed)
Lucilla Lame, MD [address] [place], [place] [address] Phone:[phone] Fax : [phone]  Primary Care Physician:  No primary care provider on file. Primary Gastroenterologist:  Dr. Allen Norris  Pre-Procedure History & Physical: HPI:  Shaun Carney is a 60 y.o. male is here for an endoscopy and colonoscopy.   Past Medical History:  Diagnosis Date  . Anxiety   . COVID-19 05/24/2018  . GERD (gastroesophageal reflux disease)   . Hypertension     Past Surgical History:  Procedure Laterality Date  . CATARACT EXTRACTION W/PHACO Left 09/29/2016   Procedure: CATARACT EXTRACTION PHACO AND INTRAOCULAR LENS PLACEMENT (IOC);  Surgeon: Eulogio Bear, MD;  Location: [facility] ORS;  Service: Ophthalmology;  Laterality: Left;  Lot # [id number] H US:00:40.4 AP%: 7.0 CDE: 2.87  . EYE SURGERY    . INSERTION OF AHMED VALVE Left 05/18/2015   Procedure: INSERTION OF AHMED VALVE AND SCLERAL PATCH GRAFT TO LEFT EYE;  Surgeon: Ronnell Freshwater, MD;  Location: [facility];  Service: Ophthalmology;  Laterality: Left;  1ST CASE PER DR VIN    Prior to Admission medications   Medication Sig Start Date End Date Taking? Authorizing Provider  amLODipine (NORVASC) 10 MG tablet Take 1 tablet by mouth once daily 11/19/19  Yes Cannady, Jolene T, NP  benazepril (LOTENSIN) 40 MG tablet Take 1 tablet by mouth once daily 11/19/19  Yes Cannady, Jolene T, NP  clotrimazole-betamethasone (LOTRISONE) cream Apply 1 application topically 2 (two) times daily. 03/19/20  Yes Johnson, Megan P, DO  fluticasone (FLONASE) 50 MCG/ACT nasal spray SHAKE LIQUID AND USE 2 SPRAYS IN EACH NOSTRIL EVERY DAY 05/12/18  Yes Crissman, Jeannette How, MD  hydrochlorothiazide (HYDRODIURIL) 25 MG tablet Take 1 tablet by mouth once daily 03/15/20  Yes Cannady, Jolene T, NP  pantoprazole (PROTONIX) 40 MG tablet TAKE 1 TABLET(40 MG) BY MOUTH DAILY 11/19/19  Yes Cannady, Jolene T, NP  sildenafil (REVATIO) 20 MG tablet TAKE 1 TABLET BY  MOUTH ONCE DAILY AS NEEDED 03/15/20  Yes Cannady, Jolene T, NP  sucralfate (CARAFATE) 1 g tablet Take 1 tablet (1 g total) by mouth 4 (four) times daily -  with meals and at bedtime. 03/19/20  Yes Johnson, Megan P, DO    Allergies as of 03/12/2020  . (No Known Allergies)    Family History  Problem Relation Age of Onset  . Cancer Mother        throat  . Hypertension Mother   . Heart attack Mother   . Cancer Father        lung    Social History   Socioeconomic History  . Marital status: Married    Spouse name: Not on file  . Number of children: Not on file  . Years of education: Not on file  . Highest education level: Not on file  Occupational History  . Not on file  Tobacco Use  . Smoking status: Never Smoker  . Smokeless tobacco: Never Used  Vaping Use  . Vaping Use: Never used  Substance and Sexual Activity  . Alcohol use: Yes    Alcohol/week: 6.0 standard drinks    Types: 6 Cans of beer per week    Comment: weekends  . Drug use: No  . Sexual activity: Yes  Other Topics Concern  . Not on file  Social History Narrative  . Not on file   Social Determinants of Health   Financial Resource Strain: Not on file  Food Insecurity: Not on file  Transportation  Needs: Not on file  Physical Activity: Not on file  Stress: Not on file  Social Connections: Not on file  Intimate Partner Violence: Not on file    Review of Systems: See HPI, otherwise negative ROS  Physical Exam: BP 127/84   Pulse 81   Temp (!) 97.3 F (36.3 C) (Temporal)   Resp 16   Ht 5\' 7"  (1.702 m)   Wt 69.4 kg   SpO2 99%   BMI 23.96 kg/m  General:   Alert,  pleasant and cooperative in NAD Head:  Normocephalic and atraumatic. Neck:  Supple; no masses or thyromegaly. Lungs:  Clear throughout to auscultation.    Heart:  Regular rate and rhythm. Abdomen:  Soft, nontender and nondistended. Normal bowel sounds, without guarding, and without rebound.   Neurologic:  Alert and  oriented x4;  grossly  normal neurologically.  Impression/Plan: Shaun Carney is here for an endoscopy and colonoscopy to be performed for epigastric pain and screening  Risks, benefits, limitations, and alternatives regarding  endoscopy and colonoscopy have been reviewed with the patient.  Questions have been answered.  All parties agreeable.   Lucilla Lame, MD  04/03/2020, 7:27 AM

## 2020-04-03 NOTE — Anesthesia Preprocedure Evaluation (Signed)
Anesthesia Evaluation  Patient identified by MRN, date of birth, ID band Patient awake    Reviewed: Allergy & Precautions, H&P , NPO status , Patient's Chart, lab work & pertinent test results  Airway Mallampati: II  TM Distance: >3 FB Neck ROM: full    Dental no notable dental hx.    Pulmonary    Pulmonary exam normal breath sounds clear to auscultation       Cardiovascular hypertension, Normal cardiovascular exam Rhythm:regular Rate:Normal     Neuro/Psych Anxiety    GI/Hepatic GERD  ,  Endo/Other    Renal/GU      Musculoskeletal   Abdominal   Peds  Hematology   Anesthesia Other Findings   Reproductive/Obstetrics                             Anesthesia Physical Anesthesia Plan  ASA: II  Anesthesia Plan: General   Post-op Pain Management:    Induction: Intravenous  PONV Risk Score and Plan: 2 and Treatment may vary due to age or medical condition, TIVA and Propofol infusion  Airway Management Planned: Natural Airway  Additional Equipment:   Intra-op Plan:   Post-operative Plan:   Informed Consent: I have reviewed the patients History and Physical, chart, labs and discussed the procedure including the risks, benefits and alternatives for the proposed anesthesia with the patient or authorized representative who has indicated his/her understanding and acceptance.     Dental Advisory Given  Plan Discussed with: CRNA  Anesthesia Plan Comments:         Anesthesia Quick Evaluation

## 2020-04-03 NOTE — Anesthesia Procedure Notes (Signed)
Procedure Name: MAC Date/Time: 04/03/2020 7:51 AM Performed by: Jeannene Patella, CRNA Pre-anesthesia Checklist: Patient identified, Emergency Drugs available, Suction available, Timeout performed and Patient being monitored Patient Re-evaluated:Patient Re-evaluated prior to induction Oxygen Delivery Method: Nasal cannula Placement Confirmation: positive ETCO2

## 2020-04-03 NOTE — Transfer of Care (Signed)
Immediate Anesthesia Transfer of Care Note  Patient: Shaun Carney  Procedure(s) Performed: COLONOSCOPY WITH BIOPSY (N/A Rectum) ESOPHAGOGASTRODUODENOSCOPY (EGD) WITH PROPOFOL (N/A ) POLYPECTOMY (N/A Rectum)  Patient Location: PACU  Anesthesia Type: General  Level of Consciousness: awake, alert  and patient cooperative  Airway and Oxygen Therapy: Patient Spontanous Breathing and Patient connected to supplemental oxygen  Post-op Assessment: Post-op Vital signs reviewed, Patient's Cardiovascular Status Stable, Respiratory Function Stable, Patent Airway and No signs of Nausea or vomiting  Post-op Vital Signs: Reviewed and stable  Complications: No complications documented.

## 2020-04-06 LAB — SURGICAL PATHOLOGY

## 2020-04-07 ENCOUNTER — Encounter: Payer: Self-pay | Admitting: Gastroenterology

## 2020-04-13 ENCOUNTER — Ambulatory Visit: Payer: Self-pay | Admitting: *Deleted

## 2020-04-13 NOTE — Telephone Encounter (Signed)
Being the abd pain is an ongoing problem he is seeing Dr. Park Liter for I did not need to triage him.   Agent making an appt for him.

## 2020-04-14 ENCOUNTER — Ambulatory Visit: Payer: BC Managed Care – PPO | Admitting: Family Medicine

## 2020-04-14 ENCOUNTER — Encounter: Payer: Self-pay | Admitting: Family Medicine

## 2020-04-14 ENCOUNTER — Other Ambulatory Visit: Payer: Self-pay

## 2020-04-14 VITALS — BP 121/71 | HR 77 | Temp 98.2°F | Wt 159.4 lb

## 2020-04-14 DIAGNOSIS — R1013 Epigastric pain: Secondary | ICD-10-CM

## 2020-04-14 NOTE — Progress Notes (Signed)
BP 121/71   Pulse 77   Temp 98.2 F (36.8 C)   Wt 159 lb 6.4 oz (72.3 kg)   SpO2 97%   BMI 24.97 kg/m    Subjective:    Patient ID: Shaun Carney, male    DOB: Nov 02, 1960, 61 y.o.   MRN: [id number]  HPI: Shaun Carney is a 60 y.o. male  Chief Complaint  Patient presents with  . Abdominal Pain    Patient states he has been having abdominal pain since February. Pain is every day now.    Continues with abdominal pain. Had EGD done with GI and it was normal. Does not feel any better with the PPI and carafate. He may have felt better for a couple of days, but now the pain is worse. No nausea. No vomiting. No blood in his stool. No diarrhea. No other symptoms. He is concerned about what's going on and is concerned that it is not getting better.   Relevant past medical, surgical, family and social history reviewed and updated as indicated. Interim medical history since our last visit reviewed. Allergies and medications reviewed and updated.  Review of Systems  Constitutional: Negative.   Respiratory: Negative.   Cardiovascular: Negative.   Gastrointestinal: Positive for abdominal pain. Negative for abdominal distention, anal bleeding, blood in stool, constipation, diarrhea, nausea, rectal pain and vomiting.  Musculoskeletal: Negative.   Skin: Negative.   Neurological: Negative.   Psychiatric/Behavioral: Negative.     Per HPI unless specifically indicated above     Objective:    BP 121/71   Pulse 77   Temp 98.2 F (36.8 C)   Wt 159 lb 6.4 oz (72.3 kg)   SpO2 97%   BMI 24.97 kg/m   Wt Readings from Last 3 Encounters:  04/14/20 159 lb 6.4 oz (72.3 kg)  04/03/20 153 lb (69.4 kg)  03/19/20 160 lb 12.8 oz (72.9 kg)    Physical Exam Vitals and nursing note reviewed.  Constitutional:      General: He is not in acute distress.    Appearance: Normal appearance. He is not ill-appearing, toxic-appearing or diaphoretic.  HENT:     Head: Normocephalic and atraumatic.      Right Ear: External ear normal.     Left Ear: External ear normal.     Nose: Nose normal.     Mouth/Throat:     Mouth: Mucous membranes are moist.     Pharynx: Oropharynx is clear.  Eyes:     General: No scleral icterus.       Right eye: No discharge.        Left eye: No discharge.     Extraocular Movements: Extraocular movements intact.     Conjunctiva/sclera: Conjunctivae normal.     Pupils: Pupils are equal, round, and reactive to light.  Cardiovascular:     Rate and Rhythm: Normal rate and regular rhythm.     Pulses: Normal pulses.     Heart sounds: Normal heart sounds. No murmur heard. No friction rub. No gallop.   Pulmonary:     Effort: Pulmonary effort is normal. No respiratory distress.     Breath sounds: Normal breath sounds. No stridor. No wheezing, rhonchi or rales.  Chest:     Chest wall: No tenderness.  Abdominal:     General: Abdomen is flat. Bowel sounds are normal.     Palpations: Abdomen is soft.     Tenderness: There is abdominal tenderness in the epigastric area.  Musculoskeletal:  General: Normal range of motion.     Cervical back: Normal range of motion and neck supple.  Skin:    General: Skin is warm and dry.     Capillary Refill: Capillary refill takes less than 2 seconds.     Coloration: Skin is not jaundiced or pale.     Findings: No bruising, erythema, lesion or rash.  Neurological:     General: No focal deficit present.     Mental Status: He is alert and oriented to person, place, and time. Mental status is at baseline.  Psychiatric:        Mood and Affect: Mood normal.        Behavior: Behavior normal.        Thought Content: Thought content normal.        Judgment: Judgment normal.     Results for orders placed or performed during the hospital encounter of 04/03/20  Surgical pathology  Result Value Ref Range   SURGICAL PATHOLOGY      SURGICAL PATHOLOGY CASE: [id number] PATIENT: [name]  Report     Specimen Submitted: A. Colon polyp x2, cecum; cbx/cold snare  Clinical History: Screening colonoscopy, epigastric pain R10.13.  Normal EGD; colon polyps      DIAGNOSIS: A. COLON POLYP X2, CECUM; COLD BIOPSY/COLD SNARE: - SESSILE SERRATED POLYP. - NEGATIVE FOR DYSPLASIA AND MALIGNANCY.   GROSS DESCRIPTION: A. Labeled: Cecal polyp x2 Received: In formalin Collection time: 7:52 AM on 04/03/2020 Placed into formalin time: 7:52 AM on 04/03/2020 Tissue fragment(s): 2 Size: Up to 0.4 and 1.1 cm Description: Received are 2 fragments of pink-tan soft tissue, the largest of which is inked at the biopsy margin and serially sectioned. Entirely submitted in cassette 1.  BAS  Final Diagnosis performed by Betsy Pries, MD.   Electronically signed 04/06/2020 9:45:54AM The electronic signature indicates that the named Attending Pathologist has evaluated the specimen Technic al component performed at [facility], [address], [place], [place] [address] Lab: [phone] Dir: Rush Farmer, MD, MMM  Professional component performed at [facility], [facility], [place], [place], [place] [address] Lab: [phone] Dir: Dellia Nims. Reuel Derby, MD       Assessment & Plan:   Problem List Items Addressed This Visit   None   Visit Diagnoses    Epigastric pain    -  Primary   Normal labs, normal EGD. No better with PPI or carafate. Will check CT abdomen to look for mass. Call with any concerns.    Relevant Orders   CT Abdomen Pelvis W Contrast       Follow up plan: Return in about 2 weeks (around 04/28/2020).

## 2020-04-15 ENCOUNTER — Ambulatory Visit: Payer: Self-pay | Admitting: *Deleted

## 2020-04-15 NOTE — Telephone Encounter (Signed)
Patient calling to request PCP call to speed up referral or procedure to look at his abdomen. Patient reports he is in severe pain and wants PCP to call and get scans to be completed this week. Encouraged patient to go to ED if pain is now significant than previously noted. Patient reports he does not want to sit in ED all day. Patient was seen in office yesterday . Reviewed with patient he would be contacted when appt available to complete CT. Patient requesting office to call and recommend appt to be this week. Attempted to provide emotional reassurance due to anxiety patient has to find out about his abdominal pain. Instructed patient to allow more time for office to set up his appt and he would be contacted when CT is to be done. Care advise given. Patient verbalized understanding of care advise and to call back or go to ED if symptoms worsen.  Please advise .  Reason for Disposition . Age > 60 years  Answer Assessment - Initial Assessment Questions 1. LOCATION: "Where does it hurt?"      In my abdomen 2. RADIATION: "Does the pain shoot anywhere else?" (e.g., chest, back)     na 3. ONSET: "When did the pain begin?" (Minutes, hours or days ago)      na 4. SUDDEN: "Gradual or sudden onset?"     na 5. PATTERN "Does the pain come and go, or is it constant?"    - If constant: "Is it getting better, staying the same, or worsening?"      (Note: Constant means the pain never goes away completely; most serious pain is constant and it progresses)     - If intermittent: "How long does it last?" "Do you have pain now?"     (Note: Intermittent means the pain goes away completely between bouts)     na 6. SEVERITY: "How bad is the pain?"  (e.g., Scale 1-10; mild, moderate, or severe)    - MILD (1-3): doesn't interfere with normal activities, abdomen soft and not tender to touch     - MODERATE (4-7): interferes with normal activities or awakens from sleep, tender to touch     - SEVERE (8-10):  excruciating pain, doubled over, unable to do any normal activities       In pain and wants to get procedure done  7. RECURRENT SYMPTOM: "Have you ever had this type of stomach pain before?" If Yes, ask: "When was the last time?" and "What happened that time?"      Yes, patient did not reports specifics 8. CAUSE: "What do you think is causing the stomach pain?"     Does not know and wants CT scan to find out what is wrong 9. RELIEVING/AGGRAVATING FACTORS: "What makes it better or worse?" (e.g., movement, antacids, bowel movement)     na 10. OTHER SYMPTOMS: "Has there been any vomiting, diarrhea, constipation, or urine problems?"       Abdominal pain  Protocols used: ABDOMINAL PAIN - MALE-A-AH

## 2020-04-16 NOTE — Telephone Encounter (Signed)
Patient notified of Dr.Johnson's response.

## 2020-04-16 NOTE — Telephone Encounter (Signed)
Shaun Peat- can we see if this can be moved up?  If his pain is significantly worse, he should go to the ER

## 2020-04-28 ENCOUNTER — Other Ambulatory Visit: Payer: Self-pay

## 2020-04-28 ENCOUNTER — Ambulatory Visit
Admission: RE | Admit: 2020-04-28 | Discharge: 2020-04-28 | Disposition: A | Discharge status: Home / Self Care | Payer: BC Managed Care – PPO | Source: Ambulatory Visit | Attending: Family Medicine | Admitting: Family Medicine

## 2020-04-28 DIAGNOSIS — R1013 Epigastric pain: Secondary | ICD-10-CM

## 2020-04-28 DIAGNOSIS — R109 Unspecified abdominal pain: Secondary | ICD-10-CM | POA: Diagnosis not present

## 2020-04-28 LAB — POCT I-STAT CREATININE: Creatinine, Ser: 1.2 mg/dL (ref 0.61–1.24)

## 2020-04-28 MED ORDER — IOHEXOL 300 MG/ML  SOLN
100.0000 mL | Freq: Once | INTRAMUSCULAR | Status: AC | PRN
  Administered 2020-04-28: 100 mL via INTRAVENOUS

## 2020-05-16 ENCOUNTER — Other Ambulatory Visit: Payer: Self-pay | Admitting: Nurse Practitioner

## 2020-05-16 DIAGNOSIS — N529 Male erectile dysfunction, unspecified: Secondary | ICD-10-CM

## 2020-05-16 NOTE — Telephone Encounter (Signed)
Requested Prescriptions  Pending Prescriptions Disp Refills  . sildenafil (REVATIO) 20 MG tablet [Pharmacy Med Name: Sildenafil Citrate 20 MG Oral Tablet] 50 tablet 0    Sig: TAKE 1 TABLET BY MOUTH ONCE DAILY AS NEEDED     Urology: Erectile Dysfunction Agents Passed - 05/16/2020  3:42 PM      Passed - Last BP in normal range    BP Readings from Last 1 Encounters:  04/14/20 121/71         Passed - Valid encounter within last 12 months    Recent Outpatient Visits          1 month ago Epigastric pain   [place], [place], DO   1 month ago Epigastric pain   [place], [place], DO   5 months ago Generalized abdominal pain   [place], [place] T, NP   10 months ago Herpes zoster without complication   [facility] Eulogio Bear, NP   1 year ago Essential hypertension   [place], [place], [place]

## 2020-06-11 ENCOUNTER — Other Ambulatory Visit: Payer: Self-pay

## 2020-06-11 ENCOUNTER — Ambulatory Visit: Payer: BC Managed Care – PPO | Admitting: Family Medicine

## 2020-06-11 ENCOUNTER — Encounter: Payer: Self-pay | Admitting: Family Medicine

## 2020-06-11 VITALS — BP 117/73 | HR 83 | Temp 98.2°F | Wt 161.0 lb

## 2020-06-11 DIAGNOSIS — R350 Frequency of micturition: Secondary | ICD-10-CM

## 2020-06-11 DIAGNOSIS — N401 Enlarged prostate with lower urinary tract symptoms: Secondary | ICD-10-CM | POA: Diagnosis not present

## 2020-06-11 LAB — URINALYSIS, ROUTINE W REFLEX MICROSCOPIC
Bilirubin, UA: NEGATIVE
Glucose, UA: NEGATIVE
Ketones, UA: NEGATIVE
Leukocytes,UA: NEGATIVE
Nitrite, UA: NEGATIVE
Protein,UA: NEGATIVE
RBC, UA: NEGATIVE
Specific Gravity, UA: 1.015 (ref 1.005–1.030)
Urobilinogen, Ur: 0.2 mg/dL (ref 0.2–1.0)
pH, UA: 5.5 (ref 5.0–7.5)

## 2020-06-11 MED ORDER — TAMSULOSIN HCL 0.4 MG PO CAPS
0.4000 mg | ORAL_CAPSULE | Freq: Every day | ORAL | 3 refills | Status: DC
Start: 2020-06-11 — End: 2020-07-30

## 2020-06-11 NOTE — Patient Instructions (Signed)

## 2020-06-11 NOTE — Progress Notes (Signed)
BP 117/73   Pulse 83   Temp 98.2 F (36.8 C)   Wt 161 lb (73 kg)   SpO2 98%   BMI 25.22 kg/m    Subjective:    Patient ID: Shaun Carney, male    DOB: [birth year], 60 y.o.   MRN: [id number]  HPI: Shaun Carney is a 60 y.o. male  Chief Complaint  Patient presents with  . Urinary Frequency    Patient states he has been having frequent urination for about a week.    URINARY SYMPTOMS Duration: about a week Dysuria: no Urinary frequency: yes Urgency: yes Small volume voids: yes Symptom severity: moderate Urinary incontinence: no Foul odor: no Hematuria: no Abdominal pain: no Back pain: no Suprapubic pain/pressure: no Flank pain: no Fever:  no Vomiting: no Relief with cranberry juice: no Relief with pyridium: no Status: stable Previous urinary tract infection: yes Sexual activity: No sexually active/monogomous/practicing safe sex History of sexually transmitted disease: no Penile discharge: no Treatments attempted: increasing fluids   Relevant past medical, surgical, family and social history reviewed and updated as indicated. Interim medical history since our last visit reviewed. Allergies and medications reviewed and updated.  Review of Systems  Constitutional: Negative.   HENT: Negative.   Respiratory: Negative.   Cardiovascular: Negative.   Gastrointestinal: Negative.   Genitourinary: Positive for frequency and urgency. Negative for decreased urine volume, difficulty urinating, dysuria, enuresis, flank pain, genital sores, hematuria, penile discharge, penile pain, penile swelling, scrotal swelling and testicular pain.  Musculoskeletal: Negative.   Psychiatric/Behavioral: Negative.     Per HPI unless specifically indicated above     Objective:    BP 117/73   Pulse 83   Temp 98.2 F (36.8 C)   Wt 161 lb (73 kg)   SpO2 98%   BMI 25.22 kg/m   Wt Readings from Last 3 Encounters:  06/11/20 161 lb (73 kg)  04/14/20 159 lb 6.4 oz (72.3 kg)  04/03/20  153 lb (69.4 kg)    Physical Exam Vitals and nursing note reviewed.  Constitutional:      General: He is not in acute distress.    Appearance: Normal appearance. He is not ill-appearing, toxic-appearing or diaphoretic.  HENT:     Head: Normocephalic and atraumatic.     Right Ear: External ear normal.     Left Ear: External ear normal.     Nose: Nose normal.     Mouth/Throat:     Mouth: Mucous membranes are moist.     Pharynx: Oropharynx is clear.  Eyes:     General: No scleral icterus.       Right eye: No discharge.        Left eye: No discharge.     Extraocular Movements: Extraocular movements intact.     Conjunctiva/sclera: Conjunctivae normal.     Pupils: Pupils are equal, round, and reactive to light.  Cardiovascular:     Rate and Rhythm: Normal rate and regular rhythm.     Pulses: Normal pulses.     Heart sounds: Normal heart sounds. No murmur heard. No friction rub. No gallop.   Pulmonary:     Effort: Pulmonary effort is normal. No respiratory distress.     Breath sounds: Normal breath sounds. No stridor. No wheezing, rhonchi or rales.  Chest:     Chest wall: No tenderness.  Musculoskeletal:        General: Normal range of motion.     Cervical back: Normal range of motion and neck supple.  Skin:    General: Skin is warm and dry.     Capillary Refill: Capillary refill takes less than 2 seconds.     Coloration: Skin is not jaundiced or pale.     Findings: No bruising, erythema, lesion or rash.  Neurological:     General: No focal deficit present.     Mental Status: He is alert and oriented to person, place, and time. Mental status is at baseline.  Psychiatric:        Mood and Affect: Mood normal.        Behavior: Behavior normal.        Thought Content: Thought content normal.        Judgment: Judgment normal.     Results for orders placed or performed during the hospital encounter of 04/28/20  I-STAT creatinine  Result Value Ref Range   Creatinine, Ser 1.20  0.61 - 1.24 mg/dL      Assessment & Plan:   Problem List Items Addressed This Visit   None   Visit Diagnoses    Benign prostatic hyperplasia with urinary frequency    -  Primary   Will start flomax and check PSA. Await results. Treat as needed.    Relevant Orders   PSA   Urinary frequency       UA clear. Will treat for BPH   Relevant Orders   Urinalysis, Routine w reflex microscopic       Follow up plan: Return in about 4 weeks (around 07/09/2020) for virtual OK.

## 2020-06-12 ENCOUNTER — Encounter: Payer: Self-pay | Admitting: Family Medicine

## 2020-06-12 LAB — PSA: Prostate Specific Ag, Serum: 1.6 ng/mL (ref 0.0–4.0)

## 2020-06-21 ENCOUNTER — Other Ambulatory Visit: Payer: Self-pay | Admitting: Nurse Practitioner

## 2020-06-21 NOTE — Telephone Encounter (Signed)
Requested Prescriptions  Pending Prescriptions Disp Refills  . hydrochlorothiazide (HYDRODIURIL) 25 MG tablet [Pharmacy Med Name: hydroCHLOROthiazide 25 MG Oral Tablet] 90 tablet 0    Sig: Take 1 tablet by mouth once daily     Cardiovascular: Diuretics - Thiazide Passed - 06/21/2020 11:49 AM      Passed - Ca in normal range and within 360 days    Calcium  Date Value Ref Range Status  03/19/2020 9.1 8.6 - 10.2 mg/dL Final   Calcium, Total  Date Value Ref Range Status  01/11/2013 8.8 8.5 - 10.1 mg/dL Final         Passed - Cr in normal range and within 360 days    Creatinine  Date Value Ref Range Status  01/11/2013 1.04 0.60 - 1.30 mg/dL Final   Creatinine, Ser  Date Value Ref Range Status  04/28/2020 1.20 0.61 - 1.24 mg/dL Final         Passed - K in normal range and within 360 days    Potassium  Date Value Ref Range Status  03/19/2020 3.6 3.5 - 5.2 mmol/L Final  01/11/2013 3.7 3.5 - 5.1 mmol/L Final         Passed - Na in normal range and within 360 days    Sodium  Date Value Ref Range Status  03/19/2020 141 134 - 144 mmol/L Final  01/11/2013 137 136 - 145 mmol/L Final         Passed - Last BP in normal range    BP Readings from Last 1 Encounters:  06/11/20 117/73         Passed - Valid encounter within last 6 months    Recent Outpatient Visits          1 week ago Benign prostatic hyperplasia with urinary frequency   [place], Megan P, DO   2 months ago Epigastric pain   [place], [place], DO   3 months ago Epigastric pain   [place], [place], DO   7 months ago Generalized abdominal pain   [place], Henrine Screws T, NP   11 months ago Herpes zoster without complication   [facility] Eulogio Bear, NP

## 2020-07-30 ENCOUNTER — Other Ambulatory Visit: Payer: Self-pay

## 2020-07-30 ENCOUNTER — Ambulatory Visit: Payer: BC Managed Care – PPO | Admitting: Family Medicine

## 2020-07-30 ENCOUNTER — Encounter: Payer: Self-pay | Admitting: Family Medicine

## 2020-07-30 VITALS — BP 112/73 | HR 61 | Temp 97.9°F | Wt 160.2 lb

## 2020-07-30 DIAGNOSIS — R1013 Epigastric pain: Secondary | ICD-10-CM | POA: Diagnosis not present

## 2020-07-30 DIAGNOSIS — I10 Essential (primary) hypertension: Secondary | ICD-10-CM | POA: Diagnosis not present

## 2020-07-30 DIAGNOSIS — Z13220 Encounter for screening for lipoid disorders: Secondary | ICD-10-CM

## 2020-07-30 MED ORDER — DICLOFENAC SODIUM 1 % EX GEL: 4.0000 g | Freq: Four times a day (QID) | CUTANEOUS | 3 refills | Status: DC

## 2020-07-30 MED ORDER — CLOTRIMAZOLE-BETAMETHASONE 1-0.05 % EX CREA: 1.0000 "application " | TOPICAL_CREAM | Freq: Two times a day (BID) | CUTANEOUS | 0 refills | Status: DC

## 2020-07-30 MED ORDER — BACLOFEN 10 MG PO TABS: 5.0000 mg | ORAL_TABLET | Freq: Every day | ORAL | 0 refills | Status: DC

## 2020-07-30 NOTE — Assessment & Plan Note (Signed)
Under good control on current regimen. Continue current regimen. Continue to monitor. Call with any concerns. Refills up to date. Labs drawn today.  

## 2020-07-30 NOTE — Assessment & Plan Note (Signed)
Has had extensive work up including CT and EGD which were normal. Concern for muscular issue. Will start baclofen and voltaren and recheck 1 month. If better, but not gone, consider PT referral. Call with any concerns.

## 2020-07-30 NOTE — Progress Notes (Signed)
BP 112/73   Pulse 61   Temp 97.9 F (36.6 C)   Wt 160 lb 3.2 oz (72.7 kg)   SpO2 97%   BMI 25.09 kg/m    Subjective:    Patient ID: Shaun Carney, male    DOB: 01-24-1961, 60 y.o.   MRN: [id number]  HPI: Shaun Carney is a 60 y.o. male  Chief Complaint  Patient presents with   Abdominal Pain    Patient states he is still having upper abdominal pain, states area is now sore to the touch    ABDOMINAL PAIN- belly pain got better for a couple of months, then started hurting again a couple of weeks ago  Duration: chronic Onset: sudden Severity: severe Quality: aching Location:  epigastric  Episode duration:  Radiation: no Frequency: constant Alleviating factors: relaxing Aggravating factors: touching it, moving his arm Status: worse Treatments attempted: antacids, PPI, and H2 Blocker Fever: no Nausea: no Vomiting: no Weight loss: no Decreased appetite: no Diarrhea: no Constipation: no Blood in stool: no Heartburn: yes- with food choices Jaundice: no Rash: no Dysuria/urinary frequency: no Hematuria: no History of sexually transmitted disease: no Recurrent NSAID use: no  HYPERTENSION Hypertension status: controlled  Satisfied with current treatment? yes Duration of hypertension: chronic BP monitoring frequency:  not checking BP medication side effects:  no Medication compliance: excellent compliance Previous BP meds: HCTZ, benazepril, amlodipine Aspirin: no Recurrent headaches: no Visual changes: no Palpitations: no Dyspnea: no Chest pain: no Lower extremity edema: no Dizzy/lightheaded: no  Relevant past medical, surgical, family and social history reviewed and updated as indicated. Interim medical history since our last visit reviewed. Allergies and medications reviewed and updated.  Review of Systems  Constitutional: Negative.   Respiratory: Negative.    Cardiovascular: Negative.   Gastrointestinal:  Positive for abdominal pain. Negative for  abdominal distention, anal bleeding, blood in stool, constipation, diarrhea, nausea, rectal pain and vomiting.  Musculoskeletal: Negative.   Psychiatric/Behavioral: Negative.     Per HPI unless specifically indicated above     Objective:    BP 112/73   Pulse 61   Temp 97.9 F (36.6 C)   Wt 160 lb 3.2 oz (72.7 kg)   SpO2 97%   BMI 25.09 kg/m   Wt Readings from Last 3 Encounters:  07/30/20 160 lb 3.2 oz (72.7 kg)  06/11/20 161 lb (73 kg)  04/14/20 159 lb 6.4 oz (72.3 kg)    Physical Exam Vitals and nursing note reviewed.  Constitutional:      General: He is not in acute distress.    Appearance: Normal appearance. He is not ill-appearing, toxic-appearing or diaphoretic.  HENT:     Head: Normocephalic and atraumatic.     Right Ear: External ear normal.     Left Ear: External ear normal.     Nose: Nose normal.     Mouth/Throat:     Mouth: Mucous membranes are moist.     Pharynx: Oropharynx is clear.  Eyes:     General: No scleral icterus.       Right eye: No discharge.        Left eye: No discharge.     Extraocular Movements: Extraocular movements intact.     Conjunctiva/sclera: Conjunctivae normal.     Pupils: Pupils are equal, round, and reactive to light.  Cardiovascular:     Rate and Rhythm: Normal rate and regular rhythm.     Pulses: Normal pulses.     Heart sounds: Normal heart sounds. No murmur  heard.   No friction rub. No gallop.  Pulmonary:     Effort: Pulmonary effort is normal. No respiratory distress.     Breath sounds: Normal breath sounds. No stridor. No wheezing, rhonchi or rales.  Chest:     Chest wall: No tenderness.  Abdominal:     General: Abdomen is flat. There is no distension or abdominal bruit. There are no signs of injury.     Palpations: Abdomen is soft.     Tenderness: There is abdominal tenderness in the epigastric area.     Hernia: No hernia is present.  Musculoskeletal:        General: Normal range of motion.     Cervical back: Normal  range of motion and neck supple.  Skin:    General: Skin is warm and dry.     Capillary Refill: Capillary refill takes less than 2 seconds.     Coloration: Skin is not jaundiced or pale.     Findings: No bruising, erythema, lesion or rash.  Neurological:     General: No focal deficit present.     Mental Status: He is alert and oriented to person, place, and time. Mental status is at baseline.  Psychiatric:        Mood and Affect: Mood normal.        Behavior: Behavior normal.        Thought Content: Thought content normal.        Judgment: Judgment normal.    Results for orders placed or performed in visit on 06/11/20  Urinalysis, Routine w reflex microscopic  Result Value Ref Range   Specific Gravity, UA 1.015 1.005 - 1.030   pH, UA 5.5 5.0 - 7.5   Color, UA Yellow Yellow   Appearance Ur Clear Clear   Leukocytes,UA Negative Negative   Protein,UA Negative Negative/Trace   Glucose, UA Negative Negative   Ketones, UA Negative Negative   RBC, UA Negative Negative   Bilirubin, UA Negative Negative   Urobilinogen, Ur 0.2 0.2 - 1.0 mg/dL   Nitrite, UA Negative Negative  PSA  Result Value Ref Range   Prostate Specific Ag, Serum 1.6 0.0 - 4.0 ng/mL      Assessment & Plan:   Problem List Items Addressed This Visit       Cardiovascular and Mediastinum   Essential hypertension - Primary    Under good control on current regimen. Continue current regimen. Continue to monitor. Call with any concerns. Refills up to date. Labs drawn today.        Relevant Orders   Comprehensive metabolic panel   CBC with Differential/Platelet   TSH     Other   Epigastric pain    Has had extensive work up including CT and EGD which were normal. Concern for muscular issue. Will start baclofen and voltaren and recheck 1 month. If better, but not gone, consider PT referral. Call with any concerns.        Relevant Orders   Comprehensive metabolic panel   CBC with Differential/Platelet    Other Visit Diagnoses     Screening for cholesterol level       Labs drawn today. Await results.    Relevant Orders   Lipid Panel w/o Chol/HDL Ratio        Follow up plan: Return in about 4 weeks (around 08/27/2020).

## 2020-07-31 ENCOUNTER — Encounter: Payer: Self-pay | Admitting: Family Medicine

## 2020-07-31 LAB — CBC WITH DIFFERENTIAL/PLATELET
Basophils Absolute: 0.1 10*3/uL (ref 0.0–0.2)
Basos: 1 %
EOS (ABSOLUTE): 0.2 10*3/uL (ref 0.0–0.4)
Eos: 4 %
Hematocrit: 43.6 % (ref 37.5–51.0)
Hemoglobin: 14.3 g/dL (ref 13.0–17.7)
Immature Grans (Abs): 0 10*3/uL (ref 0.0–0.1)
Immature Granulocytes: 0 %
Lymphocytes Absolute: 1.9 10*3/uL (ref 0.7–3.1)
Lymphs: 30 %
MCH: 28.8 pg (ref 26.6–33.0)
MCHC: 32.8 g/dL (ref 31.5–35.7)
MCV: 88 fL (ref 79–97)
Monocytes Absolute: 0.6 10*3/uL (ref 0.1–0.9)
Monocytes: 10 %
Neutrophils Absolute: 3.5 10*3/uL (ref 1.4–7.0)
Neutrophils: 55 %
Platelets: 343 10*3/uL (ref 150–450)
RBC: 4.97 x10E6/uL (ref 4.14–5.80)
RDW: 12 % (ref 11.6–15.4)
WBC: 6.3 10*3/uL (ref 3.4–10.8)

## 2020-07-31 LAB — LIPID PANEL W/O CHOL/HDL RATIO
Cholesterol, Total: 237 mg/dL — ABNORMAL HIGH (ref 100–199)
HDL: 40 mg/dL (ref >39)
LDL Chol Calc (NIH): 168 mg/dL — ABNORMAL HIGH (ref 0–99)
Triglycerides: 159 mg/dL — ABNORMAL HIGH (ref 0–149)
VLDL Cholesterol Cal: 29 mg/dL (ref 5–40)

## 2020-07-31 LAB — COMPREHENSIVE METABOLIC PANEL
ALT: 18 IU/L (ref 0–44)
AST: 17 IU/L (ref 0–40)
Albumin/Globulin Ratio: 1.2 (ref 1.2–2.2)
Albumin: 4.2 g/dL (ref 3.8–4.8)
Alkaline Phosphatase: 94 IU/L (ref 44–121)
BUN/Creatinine Ratio: 18 (ref 10–24)
BUN: 22 mg/dL (ref 8–27)
Bilirubin Total: 0.6 mg/dL (ref 0.0–1.2)
CO2: 24 mmol/L (ref 20–29)
Calcium: 9.6 mg/dL (ref 8.6–10.2)
Chloride: 99 mmol/L (ref 96–106)
Creatinine, Ser: 1.24 mg/dL (ref 0.76–1.27)
Globulin, Total: 3.5 g/dL (ref 1.5–4.5)
Glucose: 101 mg/dL — ABNORMAL HIGH (ref 65–99)
Potassium: 4 mmol/L (ref 3.5–5.2)
Sodium: 139 mmol/L (ref 134–144)
Total Protein: 7.7 g/dL (ref 6.0–8.5)
eGFR: 66 mL/min/{1.73_m2} (ref >59)

## 2020-07-31 LAB — TSH: TSH: 2.45 u[IU]/mL (ref 0.450–4.500)

## 2020-08-11 ENCOUNTER — Ambulatory Visit: Payer: BC Managed Care – PPO | Admitting: Family Medicine

## 2020-08-22 ENCOUNTER — Other Ambulatory Visit: Payer: Self-pay | Admitting: Family Medicine

## 2020-08-22 DIAGNOSIS — N529 Male erectile dysfunction, unspecified: Secondary | ICD-10-CM

## 2020-08-22 NOTE — Telephone Encounter (Signed)
Requested Prescriptions  Pending Prescriptions Disp Refills  . sildenafil (REVATIO) 20 MG tablet [Pharmacy Med Name: Sildenafil Citrate 20 MG Oral Tablet] 50 tablet 0    Sig: TAKE 1 TABLET BY MOUTH ONCE DAILY AS NEEDED     Urology: Erectile Dysfunction Agents Passed - 08/22/2020  4:27 PM      Passed - Last BP in normal range    BP Readings from Last 1 Encounters:  07/30/20 112/73         Passed - Valid encounter within last 12 months    Recent Outpatient Visits          3 weeks ago Essential hypertension   [place], Megan P, DO   2 months ago Benign prostatic hyperplasia with urinary frequency   [place], Megan P, DO   4 months ago Epigastric pain   [place], [place], DO   5 months ago Epigastric pain   [place], Megan P, DO   9 months ago Generalized abdominal pain   [place], Henrine Screws T, NP      Future Appointments            In 5 days Wynetta Emery, Barb Merino, DO [employer], [place]

## 2020-08-27 ENCOUNTER — Ambulatory Visit: Payer: BC Managed Care – PPO | Admitting: Family Medicine

## 2020-09-16 ENCOUNTER — Telehealth: Payer: Self-pay

## 2020-09-16 NOTE — Telephone Encounter (Signed)
Patient notified that he should reach out to insurance to see what the cost will be, if he is ok with it, informed him to call back and schedule a nurse visit.

## 2020-09-16 NOTE — Telephone Encounter (Signed)
Copied from [place] [id number]. Topic: Appointment Scheduling - Scheduling Inquiry for Clinic >> Sep 16, 2020  2:34 PM Celene Kras wrote: Reason for CRM: Pt called stating that he is interested in receiving shingles shot. Please advise.

## 2020-09-21 ENCOUNTER — Other Ambulatory Visit: Payer: Self-pay | Admitting: Family Medicine

## 2020-09-21 NOTE — Telephone Encounter (Signed)
Patient last seen 07/30/20

## 2020-09-21 NOTE — Telephone Encounter (Signed)
Requested medication (s) are due for refill today-not due until 10/11/20 if taken as prescribed  Requested medication (s) are on the active medication list Yes  Future visit scheduled  No  Note to clinic-Last ordered 08/22/20 #50 tabs with no refills. Routing to clinic for consideration.   Requested Prescriptions  Pending Prescriptions Disp Refills   hydrochlorothiazide (HYDRODIURIL) 25 MG tablet [Pharmacy Med Name: hydroCHLOROthiazide 25 MG Oral Tablet] 90 tablet 0    Sig: Take 1 tablet by mouth once daily     Cardiovascular: Diuretics - Thiazide Passed - 09/21/2020  2:38 PM      Passed - Ca in normal range and within 360 days    Calcium  Date Value Ref Range Status  07/30/2020 9.6 8.6 - 10.2 mg/dL Final   Calcium, Total  Date Value Ref Range Status  01/11/2013 8.8 8.5 - 10.1 mg/dL Final          Passed - Cr in normal range and within 360 days    Creatinine  Date Value Ref Range Status  01/11/2013 1.04 0.60 - 1.30 mg/dL Final   Creatinine, Ser  Date Value Ref Range Status  07/30/2020 1.24 0.76 - 1.27 mg/dL Final          Passed - K in normal range and within 360 days    Potassium  Date Value Ref Range Status  07/30/2020 4.0 3.5 - 5.2 mmol/L Final  01/11/2013 3.7 3.5 - 5.1 mmol/L Final          Passed - Na in normal range and within 360 days    Sodium  Date Value Ref Range Status  07/30/2020 139 134 - 144 mmol/L Final  01/11/2013 137 136 - 145 mmol/L Final          Passed - Last BP in normal range    BP Readings from Last 1 Encounters:  07/30/20 112/73          Passed - Valid encounter within last 6 months    Recent Outpatient Visits           1 month ago Essential hypertension   [place], Megan P, DO   3 months ago Benign prostatic hyperplasia with urinary frequency   [place], Megan P, DO   5 months ago Epigastric pain   [place], [place], DO   6 months ago Epigastric pain    [place], Megan P, DO   10 months ago Generalized abdominal pain   [place], Barbaraann Faster, NP

## 2020-11-04 ENCOUNTER — Ambulatory Visit: Payer: Self-pay

## 2020-11-04 ENCOUNTER — Ambulatory Visit: Payer: Self-pay | Admitting: *Deleted

## 2020-11-04 NOTE — Telephone Encounter (Signed)
Reason for Disposition  [1] MILD longstanding difficulty breathing AND [2]  SAME as normal  Answer Assessment - Initial Assessment Questions 1. RESPIRATORY STATUS: "Describe your breathing?" (e.g., wheezing, shortness of breath, unable to speak, severe coughing)      C/o feeling he needs to take a breath to catch his breath 2. ONSET: "When did this breathing problem begin?"      1 week ago  3. PATTERN "Does the difficult breathing come and go, or has it been constant since it started?"      Na  4. SEVERITY: "How bad is your breathing?" (e.g., mild, moderate, severe)    - MILD: No SOB at rest, mild SOB with walking, speaks normally in sentences, can lie down, no retractions, pulse < 100.    - MODERATE: SOB at rest, SOB with minimal exertion and prefers to sit, cannot lie down flat, speaks in phrases, mild retractions, audible wheezing, pulse 100-120.    - SEVERE: Very SOB at rest, speaks in single words, struggling to breathe, sitting hunched forward, retractions, pulse > 120      Mild .  5. RECURRENT SYMPTOM: "Have you had difficulty breathing before?" If Yes, ask: "When was the last time?" and "What happened that time?"      Yes years ago prescribed inhaler that helped  6. CARDIAC HISTORY: "Do you have any history of heart disease?" (e.g., heart attack, angina, bypass surgery, angioplasty)      na 7. LUNG HISTORY: "Do you have any history of lung disease?"  (e.g., pulmonary embolus, asthma, emphysema)     na 8. CAUSE: "What do you think is causing the breathing problem?"      Not sure  9. OTHER SYMPTOMS: "Do you have any other symptoms? (e.g., dizziness, runny nose, cough, chest pain, fever)     Denies  10. O2 SATURATION MONITOR:  "Do you use an oxygen saturation monitor (pulse oximeter) at home?" If Yes, "What is your reading (oxygen level) today?" "What is your usual oxygen saturation reading?" (e.g., 95%)       na 11. PREGNANCY: "Is there any chance you are pregnant?" "When was your  last menstrual period?"       na 12. TRAVEL: "Have you traveled out of the country in the last month?" (e.g., travel history, exposures)       na  Protocols used: Breathing Difficulty-A-AH

## 2020-11-04 NOTE — Telephone Encounter (Signed)
Called back to request inhaler . Patient reports he does not understand why he requesting medication this am and no one has addressed his needs. Reviewed with patient PCP would like to know what symptoms are prior to prescribing a medication that he has not used in a while. C/o feeling like he needs to take a breath to catch his breath. Noted happening x 1 week. Mild shortness of breath denies chest pain , difficulty breathing. While driving at times just need to take a breath to feel better. Was prescribed inhaler years ago that helped with similar symptoms but can not remember the name of medication.. no available appt until 11/11/20. Please advise . Patient requesting call back. Care advise given. Patient verbalized understanding of care advise and to call back or go to [facility] or ED if symptoms worsen.

## 2020-11-04 NOTE — Telephone Encounter (Signed)
Pt. Reports 1 week ago started having some shortness of breath. States "sometimes I just need to take a deep breath." Denies any other symptoms. Declines appointment. "I can't miss work to come in[employer] "an inhaler she gave me a while back. It really helped." Please advise pt.    Answer Assessment - Initial Assessment Questions 1. RESPIRATORY STATUS: "Describe your breathing?" (e.g., wheezing, shortness of breath, unable to speak, severe coughing)      Shortness of breath 2. ONSET: "When did this breathing problem begin?"      1 week ago 3. PATTERN "Does the difficult breathing come and go, or has it been constant since it started?"      Comes and goes 4. SEVERITY: "How bad is your breathing?" (e.g., mild, moderate, severe)    - MILD: No SOB at rest, mild SOB with walking, speaks normally in sentences, can lie down, no retractions, pulse < 100.    - MODERATE: SOB at rest, SOB with minimal exertion and prefers to sit, cannot lie down flat, speaks in phrases, mild retractions, audible wheezing, pulse 100-120.    - SEVERE: Very SOB at rest, speaks in single words, struggling to breathe, sitting hunched forward, retractions, pulse > 120      Mild 5. RECURRENT SYMPTOM: "Have you had difficulty breathing before?" If Yes, ask: "When was the last time?" and "What happened that time?"      Yes 6. CARDIAC HISTORY: "Do you have any history of heart disease?" (e.g., heart attack, angina, bypass surgery, angioplasty)      HTN 7. LUNG HISTORY: "Do you have any history of lung disease?"  (e.g., pulmonary embolus, asthma, emphysema)     No 8. CAUSE: "What do you think is causing the breathing problem?"      Unsure 9. OTHER SYMPTOMS: "Do you have any other symptoms? (e.g., dizziness, runny nose, cough, chest pain, fever)     No 10. O2 SATURATION MONITOR:  "Do you use an oxygen saturation monitor (pulse oximeter) at home?" If Yes, "What is your reading (oxygen level) today?" "What is your usual oxygen  saturation reading?" (e.g., 95%)       No 11. PREGNANCY: "Is there any chance you are pregnant?" "When was your last menstrual period?"       N/A 12. TRAVEL: "Have you traveled out of the country in the last month?" (e.g., travel history, exposures)       No  Protocols used: Breathing Difficulty-A-AH

## 2020-11-04 NOTE — Telephone Encounter (Signed)
Pt calling stating that he has been having SOB x1 week off and on. He states that he has this every couple of years and called to schedule an appt. Please advise   Left message to call back.

## 2020-11-05 NOTE — Progress Notes (Signed)
Acute Office Visit  Subjective:    Patient ID: Shaun Carney, male    DOB: 05/14/1960, 60 y.o.   MRN: [id number]  Chief Complaint  Patient presents with   Shortness of Breath    For a week    HPI Patient is in today for shortness of breath for 1 week. 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 He would like this to be rechecked.   SHORTNESS OF BREATH  Duration: 1 week Onset: gradual Description of breathing discomfort: every once in a while he needs to take a deep breath Severity: mild Episode duration: second Frequency: several times a day Related to exertion: no Cough: yes non-productive Chest tightness: no Wheezing: no Fevers: no Chest pain: no Palpitations: no  Nausea: no Diaphoresis: no Deconditioning: no Status: fluctuating Aggravating factors: unsure Alleviating factors: unsure Treatments attempted: none   Past Medical History:  Diagnosis Date   Anxiety    COVID-19 05/24/2018   GERD (gastroesophageal reflux disease)    Hypertension     Past Surgical History:  Procedure Laterality Date   CATARACT EXTRACTION W/PHACO Left 09/29/2016   Procedure: CATARACT EXTRACTION PHACO AND INTRAOCULAR LENS PLACEMENT (IOC);  Surgeon: Eulogio Bear, MD;  Location: [facility];  Service: Ophthalmology;  Laterality: Left;  Lot # [id number] H US:00:40.4 AP%: 7.0 CDE: 2.87   COLONOSCOPY WITH PROPOFOL N/A 04/03/2020   Procedure: COLONOSCOPY WITH BIOPSY;  Surgeon: Lucilla Lame, MD;  Location: [place];  Service: Endoscopy;  Laterality: N/A;  leave first case   ESOPHAGOGASTRODUODENOSCOPY (EGD) WITH PROPOFOL N/A 04/03/2020   Procedure: ESOPHAGOGASTRODUODENOSCOPY (EGD) WITH PROPOFOL;  Surgeon: Lucilla Lame, MD;  Location: [place];  Service: Endoscopy;  Laterality: N/A;   EYE SURGERY     INSERTION OF AHMED VALVE Left 05/18/2015   Procedure: INSERTION OF AHMED VALVE AND SCLERAL PATCH GRAFT TO LEFT EYE;  Surgeon: Ronnell Freshwater, MD;  Location: [place];  Service: Ophthalmology;  Laterality: Left;  1ST CASE PER DR VIN   POLYPECTOMY N/A 04/03/2020   Procedure: POLYPECTOMY;  Surgeon: Lucilla Lame, MD;  Location: [place];  Service: Endoscopy;  Laterality: N/A;    Family History  Problem Relation Age of Onset   Cancer Mother        throat   Hypertension Mother    Heart attack Mother    Cancer Father        lung    Social History   Socioeconomic History   Marital status: Married    Spouse name: Not on file   Number of children: Not on file   Years of education: Not on file   Highest education level: Not on file  Occupational History   Not on file  Tobacco Use   Smoking status: Never   Smokeless tobacco: Never  Vaping Use   Vaping Use: Never used  Substance and Sexual Activity   Alcohol use: Yes    Alcohol/week: 6.0 standard drinks    Types: 6 Cans of beer per week    Comment: weekends   Drug use: No   Sexual activity: Yes  Other Topics Concern   Not on file  Social History Narrative   Not on file   Social Determinants of Health   Financial Resource Strain: Not on file  Food Insecurity: Not on file  Transportation Needs: Not on file  Physical Activity: Not on file  Stress: Not on file  Social Connections: Not on  file  Intimate Partner Violence: Not on file    Outpatient Medications Prior to Visit  Medication Sig Dispense Refill   amLODipine (NORVASC) 10 MG tablet Take 1 tablet by mouth once daily 90 tablet 4   baclofen (LIORESAL) 10 MG tablet Take 0.5-1 tablets (5-10 mg total) by mouth at bedtime. 30 each 0   benazepril (LOTENSIN) 40 MG tablet Take 1 tablet by mouth once daily 90 tablet 4   clotrimazole-betamethasone (LOTRISONE) cream Apply 1 application topically 2 (two) times daily. 30 g 0   diclofenac Sodium (VOLTAREN) 1 % GEL Apply 4 g topically 4 (four) times daily. 100 g 3   fluticasone (FLONASE) 50 MCG/ACT nasal spray SHAKE LIQUID AND USE 2 SPRAYS  IN EACH NOSTRIL EVERY DAY 16 g 0   hydrochlorothiazide (HYDRODIURIL) 25 MG tablet Take 1 tablet by mouth once daily 90 tablet 0   pantoprazole (PROTONIX) 40 MG tablet TAKE 1 TABLET(40 MG) BY MOUTH DAILY 30 tablet 10   sildenafil (REVATIO) 20 MG tablet TAKE 1 TABLET BY MOUTH ONCE DAILY AS NEEDED 50 tablet 0   No facility-administered medications prior to visit.    No Known Allergies  Review of Systems  Constitutional: Negative.   Respiratory:  Positive for cough and shortness of breath. Negative for chest tightness.   Cardiovascular: Negative.   Gastrointestinal: Negative.   Genitourinary: Negative.   Musculoskeletal: Negative.   Skin: Negative.   Neurological: Negative.       Objective:    Physical Exam Vitals and nursing note reviewed.  Constitutional:      General: He is not in acute distress.    Appearance: Normal appearance.  HENT:     Head: Normocephalic.  Eyes:     Conjunctiva/sclera: Conjunctivae normal.  Cardiovascular:     Rate and Rhythm: Normal rate and regular rhythm.     Pulses: Normal pulses.     Heart sounds: Normal heart sounds.  Pulmonary:     Effort: Pulmonary effort is normal.     Breath sounds: Normal breath sounds.  Abdominal:     Palpations: Abdomen is soft.     Tenderness: There is no abdominal tenderness.  Musculoskeletal:     Cervical back: Normal range of motion.     Right lower leg: No edema.     Left lower leg: No edema.  Skin:    General: Skin is warm.  Neurological:     General: No focal deficit present.     Mental Status: He is alert and oriented to person, place, and time.  Psychiatric:        Mood and Affect: Mood normal.        Behavior: Behavior normal.        Thought Content: Thought content normal.        Judgment: Judgment normal.    BP 117/75   Pulse 73   Ht 5' 7"  (1.702 m)   Wt 163 lb (73.9 kg)   SpO2 97%   BMI 25.53 kg/m  Wt Readings from Last 3 Encounters:  11/06/20 163 lb (73.9 kg)  07/30/20 160 lb 3.2 oz  (72.7 kg)  06/11/20 161 lb (73 kg)    Health Maintenance Due  Topic Date Due   Zoster Vaccines- Shingrix (1 of 2) Never done   COVID-19 Vaccine (3 - Booster for Pfizer series) 10/25/2019   INFLUENZA VACCINE  08/24/2020    There are no preventive care reminders to display for this patient.   Lab Results  Component Value  Date   TSH 2.450 07/30/2020   Lab Results  Component Value Date   WBC 6.3 07/30/2020   HGB 14.3 07/30/2020   HCT 43.6 07/30/2020   MCV 88 07/30/2020   PLT 343 07/30/2020   Lab Results  Component Value Date   NA 139 07/30/2020   K 4.0 07/30/2020   CO2 24 07/30/2020   GLUCOSE 101 (H) 07/30/2020   BUN 22 07/30/2020   CREATININE 1.24 07/30/2020   BILITOT 0.6 07/30/2020   ALKPHOS 94 07/30/2020   AST 17 07/30/2020   ALT 18 07/30/2020   PROT 7.7 07/30/2020   ALBUMIN 4.2 07/30/2020   CALCIUM 9.6 07/30/2020   ANIONGAP 13 08/29/2018   EGFR 66 07/30/2020   Lab Results  Component Value Date   CHOL 237 (H) 07/30/2020   Lab Results  Component Value Date   HDL 40 07/30/2020   Lab Results  Component Value Date   LDLCALC 168 (H) 07/30/2020   Lab Results  Component Value Date   TRIG 159 (H) 07/30/2020   No results found for: CHOLHDL No results found for: HGBA1C     Assessment & Plan:   Problem List Items Addressed This Visit   None Visit Diagnoses     Proteinuria, unspecified type    -  Primary   U/A repeated in office negative for protein. Will check BMP today. Treat based on results.   Relevant Orders   Urinalysis, Routine w reflex microscopic (Completed)   Basic Metabolic Panel (BMET)   Shortness of breath       Spirometry in office normal. No chest pain. With dry cough, most likely allergies. He can start claritin or zyrtec daily. Albuterol inhaler prn.   Relevant Orders   Spirometry with graph (Completed)        Meds ordered this encounter  Medications   albuterol (VENTOLIN HFA) 108 (90 Base) MCG/ACT inhaler    Sig: Inhale 2  puffs into the lungs every 6 (six) hours as needed for wheezing or shortness of breath.    Dispense:  8 g    Refill:  [place], NP

## 2020-11-05 NOTE — Telephone Encounter (Signed)
appt

## 2020-11-05 NOTE — Telephone Encounter (Signed)
Pt could only be seen tomorrow at 4. Appt scheduled tomorrow with Ander Purpura.

## 2020-11-05 NOTE — Telephone Encounter (Signed)
I can see him at 4:20 today, but I will need to see him before I can call in medication

## 2020-11-06 ENCOUNTER — Other Ambulatory Visit: Payer: Self-pay

## 2020-11-06 ENCOUNTER — Encounter: Payer: Self-pay | Admitting: Nurse Practitioner

## 2020-11-06 ENCOUNTER — Ambulatory Visit: Payer: BC Managed Care – PPO | Admitting: Nurse Practitioner

## 2020-11-06 VITALS — BP 117/75 | HR 73 | Ht 67.0 in | Wt 163.0 lb

## 2020-11-06 DIAGNOSIS — R0602 Shortness of breath: Secondary | ICD-10-CM

## 2020-11-06 DIAGNOSIS — R809 Proteinuria, unspecified: Secondary | ICD-10-CM | POA: Diagnosis not present

## 2020-11-06 LAB — URINALYSIS, ROUTINE W REFLEX MICROSCOPIC
Bilirubin, UA: NEGATIVE
Glucose, UA: NEGATIVE
Ketones, UA: NEGATIVE
Leukocytes,UA: NEGATIVE
Nitrite, UA: NEGATIVE
Protein,UA: NEGATIVE
RBC, UA: NEGATIVE
Specific Gravity, UA: >1.03 — ABNORMAL HIGH (ref 1.005–1.030)
Urobilinogen, Ur: 1 mg/dL (ref 0.2–1.0)
pH, UA: 5 (ref 5.0–7.5)

## 2020-11-06 MED ORDER — ALBUTEROL SULFATE HFA 108 (90 BASE) MCG/ACT IN AERS: 2.0000 | INHALATION_SPRAY | Freq: Four times a day (QID) | RESPIRATORY_TRACT | 1 refills | Status: DC | PRN

## 2020-11-06 NOTE — Patient Instructions (Addendum)
Start claritin (loratidine) or zyrtec (cetirizine) daily

## 2020-11-07 LAB — BASIC METABOLIC PANEL
BUN/Creatinine Ratio: 18 (ref 10–24)
BUN: 22 mg/dL (ref 8–27)
CO2: 24 mmol/L (ref 20–29)
Calcium: 9.5 mg/dL (ref 8.6–10.2)
Chloride: 101 mmol/L (ref 96–106)
Creatinine, Ser: 1.24 mg/dL (ref 0.76–1.27)
Glucose: 99 mg/dL (ref 70–99)
Potassium: 3.6 mmol/L (ref 3.5–5.2)
Sodium: 141 mmol/L (ref 134–144)
eGFR: 66 mL/min/{1.73_m2} (ref >59)

## 2020-11-18 ENCOUNTER — Telehealth: Payer: Self-pay | Admitting: Family Medicine

## 2020-11-18 ENCOUNTER — Other Ambulatory Visit: Payer: Self-pay | Admitting: Nurse Practitioner

## 2020-11-18 DIAGNOSIS — I10 Essential (primary) hypertension: Secondary | ICD-10-CM

## 2020-11-18 DIAGNOSIS — K219 Gastro-esophageal reflux disease without esophagitis: Secondary | ICD-10-CM

## 2020-11-18 NOTE — Telephone Encounter (Signed)
Copied from [place] [id number]. Topic: General - Other >> Nov 18, 2020  2:34 PM Pawlus, Brayton Layman A wrote: Reason for CRM: Pt wanted to discuss his latest lab results, pt was waiting to hear back regarding his kidney function, please advise.

## 2020-11-19 NOTE — Telephone Encounter (Signed)
Noted  

## 2020-11-19 NOTE — Telephone Encounter (Signed)
Spoke with patient and was informed that he had not received a call from anyone regarding his lab results. Patient states it has been almost 2 weeks tomorrow and states he was told at his office visit that someone would give him a call regarding his labs. I apologized to the patient and answered all questions he had regarding his lab work. Patient verbalized understanding and was informed if he has any questions to give our office a call back. FYI

## 2020-11-19 NOTE — Telephone Encounter (Signed)
Requested Prescriptions  Pending Prescriptions Disp Refills  . amLODipine (NORVASC) 10 MG tablet [Pharmacy Med Name: amLODIPine Besylate 10 MG Oral Tablet] 90 tablet 0    Sig: Take 1 tablet by mouth once daily     Cardiovascular:  Calcium Channel Blockers Passed - 11/18/2020  5:30 PM      Passed - Last BP in normal range    BP Readings from Last 1 Encounters:  11/06/20 117/75         Passed - Valid encounter within last 6 months    Recent Outpatient Visits          1 week ago Proteinuria, unspecified type   [facility], Lauren A, NP   3 months ago Essential hypertension   [place], Megan P, DO   5 months ago Benign prostatic hyperplasia with urinary frequency   [place], Megan P, DO   7 months ago Epigastric pain   [place], [place], DO   8 months ago Epigastric pain   [place], [place], DO      Future Appointments            In 2 months [place], Barb Merino, DO [employer], [place]           . pantoprazole (PROTONIX) 40 MG tablet [Pharmacy Med Name: Pantoprazole Sodium 40 MG Oral Tablet Delayed Release] 90 tablet 1    Sig: Take 1 tablet by mouth once daily     Gastroenterology: Proton Pump Inhibitors Passed - 11/18/2020  5:30 PM      Passed - Valid encounter within last 12 months    Recent Outpatient Visits          1 week ago Proteinuria, unspecified type   [facility], Lauren A, NP   3 months ago Essential hypertension   [place], Megan P, DO   5 months ago Benign prostatic hyperplasia with urinary frequency   [place], Megan P, DO   7 months ago Epigastric pain   [place], [place], DO   8 months ago Epigastric pain   [facility] [place], Kelly, DO      Future Appointments            In 2 months Wynetta Emery, Barb Merino, DO [employer], [facility]

## 2020-12-20 ENCOUNTER — Other Ambulatory Visit: Payer: Self-pay | Admitting: Family Medicine

## 2020-12-20 ENCOUNTER — Other Ambulatory Visit: Payer: Self-pay | Admitting: Nurse Practitioner

## 2020-12-20 DIAGNOSIS — N529 Male erectile dysfunction, unspecified: Secondary | ICD-10-CM

## 2020-12-20 DIAGNOSIS — I10 Essential (primary) hypertension: Secondary | ICD-10-CM

## 2020-12-21 NOTE — Telephone Encounter (Signed)
Requested Prescriptions  Pending Prescriptions Disp Refills  . benazepril (LOTENSIN) 40 MG tablet [Pharmacy Med Name: Benazepril HCl 40 MG Oral Tablet] 90 tablet 0    Sig: Take 1 tablet by mouth once daily     Cardiovascular:  ACE Inhibitors Passed - 12/20/2020 11:28 AM      Passed - Cr in normal range and within 180 days    Creatinine  Date Value Ref Range Status  01/11/2013 1.04 0.60 - 1.30 mg/dL Final   Creatinine, Ser  Date Value Ref Range Status  11/06/2020 1.24 0.76 - 1.27 mg/dL Final         Passed - K in normal range and within 180 days    Potassium  Date Value Ref Range Status  11/06/2020 3.6 3.5 - 5.2 mmol/L Final  01/11/2013 3.7 3.5 - 5.1 mmol/L Final         Passed - Patient is not pregnant      Passed - Last BP in normal range    BP Readings from Last 1 Encounters:  11/06/20 117/75         Passed - Valid encounter within last 6 months    Recent Outpatient Visits          1 month ago Proteinuria, unspecified type   [facility], Lauren A, NP   4 months ago Essential hypertension   [place], Megan P, DO   6 months ago Benign prostatic hyperplasia with urinary frequency   [place], Megan P, DO   8 months ago Epigastric pain   [facility] [place], [place], DO   9 months ago Epigastric pain   [place], [place], DO      Future Appointments            In 1 month Johnson, Barb Merino, DO [employer], [facility]

## 2020-12-21 NOTE — Telephone Encounter (Signed)
Requested Prescriptions  Pending Prescriptions Disp Refills  . hydrochlorothiazide (HYDRODIURIL) 25 MG tablet [Pharmacy Med Name: hydroCHLOROthiazide 25 MG Oral Tablet] 90 tablet 0    Sig: Take 1 tablet by mouth once daily     Cardiovascular: Diuretics - Thiazide Passed - 12/20/2020 11:28 AM      Passed - Ca in normal range and within 360 days    Calcium  Date Value Ref Range Status  11/06/2020 9.5 8.6 - 10.2 mg/dL Final   Calcium, Total  Date Value Ref Range Status  01/11/2013 8.8 8.5 - 10.1 mg/dL Final         Passed - Cr in normal range and within 360 days    Creatinine  Date Value Ref Range Status  01/11/2013 1.04 0.60 - 1.30 mg/dL Final   Creatinine, Ser  Date Value Ref Range Status  11/06/2020 1.24 0.76 - 1.27 mg/dL Final         Passed - K in normal range and within 360 days    Potassium  Date Value Ref Range Status  11/06/2020 3.6 3.5 - 5.2 mmol/L Final  01/11/2013 3.7 3.5 - 5.1 mmol/L Final         Passed - Na in normal range and within 360 days    Sodium  Date Value Ref Range Status  11/06/2020 141 134 - 144 mmol/L Final  01/11/2013 137 136 - 145 mmol/L Final         Passed - Last BP in normal range    BP Readings from Last 1 Encounters:  11/06/20 117/75         Passed - Valid encounter within last 6 months    Recent Outpatient Visits          1 month ago Proteinuria, unspecified type   [facility], Lauren A, NP   4 months ago Essential hypertension   [place], Megan P, DO   6 months ago Benign prostatic hyperplasia with urinary frequency   [place], Megan P, DO   8 months ago Epigastric pain   [facility] [place], [place], DO   9 months ago Epigastric pain   [facility] [place], [place], DO      Future Appointments            In 1 month [place], Megan P, DO [place]           . sildenafil (REVATIO) 20 MG tablet [Pharmacy  Med Name: Sildenafil Citrate 20 MG Oral Tablet] 50 tablet 0    Sig: TAKE 1 TABLET BY MOUTH ONCE DAILY AS NEEDED     Urology: Erectile Dysfunction Agents Passed - 12/20/2020 11:28 AM      Passed - Last BP in normal range    BP Readings from Last 1 Encounters:  11/06/20 117/75         Passed - Valid encounter within last 12 months    Recent Outpatient Visits          1 month ago Proteinuria, unspecified type   [facility], Lauren A, NP   4 months ago Essential hypertension   [place], Megan P, DO   6 months ago Benign prostatic hyperplasia with urinary frequency   [place], Megan P, DO   8 months ago Epigastric pain   [facility] [place], Megan P, DO   9 months ago Epigastric pain   [facility] Valerie Roys, DO      Future Appointments            In 1 month Johnson, Barb Merino, DO [facility], [facility]

## 2021-01-20 ENCOUNTER — Other Ambulatory Visit: Payer: Self-pay | Admitting: Family Medicine

## 2021-01-20 NOTE — Telephone Encounter (Signed)
Requested medication (s) are due for refill today:   Provider to review  Requested medication (s) are on the active medication list:   Yes  Future visit scheduled:   Yes   Last ordered: 07/30/2020 30 g, 0 refill  Returned because no protocol assigned to this medication.   Requested Prescriptions  Pending Prescriptions Disp Refills   clotrimazole-betamethasone (LOTRISONE) cream [Pharmacy Med Name: Clotrimazole-Betamethasone 1-0.05 % External Cream] 30 g 0    Sig: APPLY  CREAM TOPICALLY TWICE DAILY     Off-Protocol Failed - 01/20/2021  9:05 AM      Failed - Medication not assigned to a protocol, review manually.      Passed - Valid encounter within last 12 months    Recent Outpatient Visits           2 months ago Proteinuria, unspecified type   [facility], Lauren A, NP   5 months ago Essential hypertension   [place], Megan P, DO   7 months ago Benign prostatic hyperplasia with urinary frequency   [place], Megan P, DO   9 months ago Epigastric pain   [place], [place], DO   10 months ago Epigastric pain   [place], [place], DO       Future Appointments             In 3 weeks Wynetta Emery, Barb Merino, DO [facility] [facility], [facility]

## 2021-02-12 ENCOUNTER — Ambulatory Visit: Payer: BC Managed Care – PPO | Admitting: Family Medicine

## 2021-02-20 ENCOUNTER — Other Ambulatory Visit: Payer: Self-pay | Admitting: Family Medicine

## 2021-02-20 DIAGNOSIS — N529 Male erectile dysfunction, unspecified: Secondary | ICD-10-CM

## 2021-02-20 DIAGNOSIS — I10 Essential (primary) hypertension: Secondary | ICD-10-CM

## 2021-02-20 NOTE — Telephone Encounter (Signed)
Requested Prescriptions  Pending Prescriptions Disp Refills   sildenafil (REVATIO) 20 MG tablet [Pharmacy Med Name: Sildenafil Citrate 20 MG Oral Tablet] 50 tablet 0    Sig: TAKE 1 TABLET BY MOUTH ONCE DAILY AS NEEDED     Urology: Erectile Dysfunction Agents Passed - 02/20/2021  9:19 AM      Passed - Last BP in normal range    BP Readings from Last 1 Encounters:  11/06/20 117/75         Passed - Valid encounter within last 12 months    Recent Outpatient Visits          3 months ago Proteinuria, unspecified type   [facility], Lauren A, NP   6 months ago Essential hypertension   [place], Megan P, DO   8 months ago Benign prostatic hyperplasia with urinary frequency   [place], Megan P, DO   10 months ago Epigastric pain   [facility] [place], Anna, DO   11 months ago Epigastric pain   [facility] [place], Megan P, DO              amLODipine (NORVASC) 10 MG tablet [Pharmacy Med Name: amLODIPine Besylate 10 MG Oral Tablet] 30 tablet     Sig: Take 1 tablet by mouth once daily     Cardiovascular:  Calcium Channel Blockers Passed - 02/20/2021  9:19 AM      Passed - Last BP in normal range    BP Readings from Last 1 Encounters:  11/06/20 117/75         Passed - Valid encounter within last 6 months    Recent Outpatient Visits          3 months ago Proteinuria, unspecified type   [facility], Lauren A, NP   6 months ago Essential hypertension   [place], Megan P, DO   8 months ago Benign prostatic hyperplasia with urinary frequency   [place], Megan P, DO   10 months ago Epigastric pain   [facility] [place], [place], DO   11 months ago Epigastric pain   [place], [place], DO

## 2021-03-22 ENCOUNTER — Other Ambulatory Visit: Payer: Self-pay | Admitting: Family Medicine

## 2021-03-22 DIAGNOSIS — I10 Essential (primary) hypertension: Secondary | ICD-10-CM

## 2021-03-23 NOTE — Telephone Encounter (Signed)
Requested Prescriptions  Pending Prescriptions Disp Refills   amLODipine (NORVASC) 10 MG tablet [Pharmacy Med Name: amLODIPine Besylate 10 MG Oral Tablet] 30 tablet 0    Sig: TAKE 1 TABLET BY MOUTH ONCE DAILY. PLEASE CALL AND MAKE AN APPOINTMENT FOR FURTHER REFILLS.     Cardiovascular: Calcium Channel Blockers 2 Passed - 03/22/2021  9:14 AM      Passed - Last BP in normal range    BP Readings from Last 1 Encounters:  11/06/20 117/75         Passed - Last Heart Rate in normal range    Pulse Readings from Last 1 Encounters:  11/06/20 73         Passed - Valid encounter within last 6 months    Recent Outpatient Visits          4 months ago Proteinuria, unspecified type   [facility], Lauren A, NP   7 months ago Essential hypertension   [place], Megan P, DO   9 months ago Benign prostatic hyperplasia with urinary frequency   [place], Megan P, DO   11 months ago Epigastric pain   [facility] [place], [place], DO   1 year ago Epigastric pain   [facility] [place], Megan P, DO              benazepril (LOTENSIN) 40 MG tablet [Pharmacy Med Name: Benazepril HCl 40 MG Oral Tablet] 90 tablet 0    Sig: Take 1 tablet by mouth once daily     Cardiovascular:  ACE Inhibitors Passed - 03/22/2021  9:14 AM      Passed - Cr in normal range and within 180 days    Creatinine  Date Value Ref Range Status  01/11/2013 1.04 0.60 - 1.30 mg/dL Final   Creatinine, Ser  Date Value Ref Range Status  11/06/2020 1.24 0.76 - 1.27 mg/dL Final         Passed - K in normal range and within 180 days    Potassium  Date Value Ref Range Status  11/06/2020 3.6 3.5 - 5.2 mmol/L Final  01/11/2013 3.7 3.5 - 5.1 mmol/L Final         Passed - Patient is not pregnant      Passed - Last BP in normal range    BP Readings from Last 1 Encounters:  11/06/20 117/75         Passed - Valid encounter within last 6  months    Recent Outpatient Visits          4 months ago Proteinuria, unspecified type   [facility], Lauren A, NP   7 months ago Essential hypertension   [place], Megan P, DO   9 months ago Benign prostatic hyperplasia with urinary frequency   [place], Megan P, DO   11 months ago Epigastric pain   [facility] [place], [place], DO   1 year ago Epigastric pain   [facility] [place], Megan P, DO              hydrochlorothiazide (HYDRODIURIL) 25 MG tablet [Pharmacy Med Name: hydroCHLOROthiazide 25 MG Oral Tablet] 90 tablet 0    Sig: Take 1 tablet by mouth once daily     Cardiovascular: Diuretics - Thiazide Passed - 03/22/2021  9:14 AM      Passed - Cr in normal range and within 180 days  Creatinine  Date Value Ref Range Status  01/11/2013 1.04 0.60 - 1.30 mg/dL Final   Creatinine, Ser  Date Value Ref Range Status  11/06/2020 1.24 0.76 - 1.27 mg/dL Final         Passed - K in normal range and within 180 days    Potassium  Date Value Ref Range Status  11/06/2020 3.6 3.5 - 5.2 mmol/L Final  01/11/2013 3.7 3.5 - 5.1 mmol/L Final         Passed - Na in normal range and within 180 days    Sodium  Date Value Ref Range Status  11/06/2020 141 134 - 144 mmol/L Final  01/11/2013 137 136 - 145 mmol/L Final         Passed - Last BP in normal range    BP Readings from Last 1 Encounters:  11/06/20 117/75         Passed - Valid encounter within last 6 months    Recent Outpatient Visits          4 months ago Proteinuria, unspecified type   [facility], Lauren A, NP   7 months ago Essential hypertension   [place], Megan P, DO   9 months ago Benign prostatic hyperplasia with urinary frequency   [place], Megan P, DO   11 months ago Epigastric pain   [place], [place], DO   1 year ago  Epigastric pain   [place], [place], DO

## 2021-04-19 ENCOUNTER — Telehealth: Payer: Self-pay | Admitting: Family Medicine

## 2021-04-19 DIAGNOSIS — I10 Essential (primary) hypertension: Secondary | ICD-10-CM

## 2021-04-20 NOTE — Telephone Encounter (Signed)
Requested medication (s) are due for refill today - yes ? ?Requested medication (s) are on the active medication list -yes ? ?Future visit scheduled -no ? ?Last refill: 03/23/21 #30 ? 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 Patient wants PCP to know he is taking his medication daily and his BP is good. Advised possible virtual appointment- he states he does not have smart phone- patient advised I would send call to PCP for RF decision and appointment options for him. ? ?Requested Prescriptions  ?Pending Prescriptions Disp Refills  ? amLODipine (NORVASC) 10 MG tablet [Pharmacy Med Name: amLODIPine Besylate 10 MG Oral Tablet] 30 tablet 0  ?  Sig: TAKE 1 TABLET BY MOUTH ONCE DAILY. PLEASE CALL AND MAKE APPT FOR FURTHER REFILLS  ?  ? Cardiovascular: Calcium Channel Blockers 2 Passed - 04/19/2021  9:07 AM  ?  ?  Passed - Last BP in normal range  ?  BP Readings from Last 1 Encounters:  ?11/06/20 117/75  ?  ?  ?  ?  Passed - Last Heart Rate in normal range  ?  Pulse Readings from Last 1 Encounters:  ?11/06/20 73  ?  ?  ?  ?  Passed - Valid encounter within last 6 months  ?  Recent Outpatient Visits   ? ?      ? 5 months ago Proteinuria, unspecified type  ? [place], Lauren A, NP  ? 8 months ago Essential hypertension  ? [place] P, DO  ? 10 months ago Benign prostatic hyperplasia with urinary frequency  ? [place], DO  ? 1 year ago Epigastric pain  ? [place], DO  ? 1 year ago Epigastric pain  ? [place], [place] P, DO  ? ?  ?  ? ?  ?  ?  ? ? ? ?Requested Prescriptions  ?Pending Prescriptions Disp Refills  ? amLODipine (NORVASC) 10 MG tablet [Pharmacy Med Name: amLODIPine Besylate 10 MG Oral Tablet] 30 tablet 0  ?  Sig: TAKE 1 TABLET BY MOUTH ONCE DAILY. PLEASE CALL AND MAKE APPT FOR FURTHER REFILLS   ?  ? Cardiovascular: Calcium Channel Blockers 2 Passed - 04/19/2021  9:07 AM  ?  ?  Passed - Last BP in normal range  ?  BP Readings from Last 1 Encounters:  ?11/06/20 117/75  ?  ?  ?  ?  Passed - Last Heart Rate in normal range  ?  Pulse Readings from Last 1 Encounters:  ?11/06/20 73  ?  ?  ?  ?  Passed - Valid encounter within last 6 months  ?  Recent Outpatient Visits   ? ?      ? 5 months ago Proteinuria, unspecified type  ? [place], Lauren A, NP  ? 8 months ago Essential hypertension  ? [place] P, DO  ? 10 months ago Benign prostatic hyperplasia with urinary frequency  ? [place], DO  ? 1 year ago Epigastric pain  ? [place], DO  ? 1 year ago Epigastric pain  ? [place], [place] P, DO  ? ?  ?  ? ?  ?  ?  ? ? ? ?

## 2021-04-21 NOTE — Telephone Encounter (Signed)
appt

## 2021-04-21 NOTE — Telephone Encounter (Signed)
Called pt back and informed him that Dr. Wynetta Emery has called him in a courtesy refill and will need to have an appointment for further refills ?

## 2021-04-21 NOTE — Telephone Encounter (Signed)
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

## 2021-05-10 ENCOUNTER — Telehealth: Payer: Self-pay

## 2021-05-10 ENCOUNTER — Encounter: Payer: Self-pay | Admitting: Family Medicine

## 2021-05-10 ENCOUNTER — Ambulatory Visit: Payer: BC Managed Care – PPO | Admitting: Family Medicine

## 2021-05-10 VITALS — BP 108/56 | HR 66 | Temp 98.7°F | Wt 165.0 lb

## 2021-05-10 DIAGNOSIS — I952 Hypotension due to drugs: Secondary | ICD-10-CM

## 2021-05-10 DIAGNOSIS — I10 Essential (primary) hypertension: Secondary | ICD-10-CM

## 2021-05-10 DIAGNOSIS — Z0000 Encounter for general adult medical examination without abnormal findings: Secondary | ICD-10-CM | POA: Diagnosis not present

## 2021-05-10 DIAGNOSIS — K219 Gastro-esophageal reflux disease without esophagitis: Secondary | ICD-10-CM

## 2021-05-10 DIAGNOSIS — N529 Male erectile dysfunction, unspecified: Secondary | ICD-10-CM | POA: Diagnosis not present

## 2021-05-10 DIAGNOSIS — Z136 Encounter for screening for cardiovascular disorders: Secondary | ICD-10-CM | POA: Diagnosis not present

## 2021-05-10 LAB — MICROALBUMIN, URINE WAIVED
Creatinine, Urine Waived: 200 mg/dL (ref 10–300)
Microalb, Ur Waived: 30 mg/L — ABNORMAL HIGH (ref 0–19)
Microalb/Creat Ratio: <30 mg/g (ref <30)

## 2021-05-10 LAB — URINALYSIS, ROUTINE W REFLEX MICROSCOPIC
Bilirubin, UA: NEGATIVE
Glucose, UA: NEGATIVE
Ketones, UA: NEGATIVE
Leukocytes,UA: NEGATIVE
Nitrite, UA: NEGATIVE
Protein,UA: NEGATIVE
RBC, UA: NEGATIVE
Specific Gravity, UA: >1.03 — ABNORMAL HIGH (ref 1.005–1.030)
Urobilinogen, Ur: 0.2 mg/dL (ref 0.2–1.0)
pH, UA: 5.5 (ref 5.0–7.5)

## 2021-05-10 MED ORDER — BENAZEPRIL HCL 40 MG PO TABS: 40.0000 mg | ORAL_TABLET | Freq: Every day | ORAL | 0 refills | Status: DC

## 2021-05-10 MED ORDER — HYDROCHLOROTHIAZIDE 25 MG PO TABS: 25.0000 mg | ORAL_TABLET | Freq: Every day | ORAL | 0 refills | Status: DC

## 2021-05-10 MED ORDER — PANTOPRAZOLE SODIUM 40 MG PO TBEC: DELAYED_RELEASE_TABLET | ORAL | 1 refills | Status: DC

## 2021-05-10 MED ORDER — AMLODIPINE BESYLATE 5 MG PO TABS: 5.0000 mg | ORAL_TABLET | Freq: Every day | ORAL | 3 refills | Status: DC

## 2021-05-10 MED ORDER — CLOTRIMAZOLE-BETAMETHASONE 1-0.05 % EX CREA: TOPICAL_CREAM | CUTANEOUS | 1 refills | Status: DC

## 2021-05-10 MED ORDER — SILDENAFIL CITRATE 20 MG PO TABS: 20.0000 mg | ORAL_TABLET | Freq: Every day | ORAL | 12 refills | Status: DC | PRN

## 2021-05-10 NOTE — Assessment & Plan Note (Signed)
Over treated. Will cut amlodipine to '5mg'$  and recheck 1-3 months. Labs drawn today. Refills given.  ?

## 2021-05-10 NOTE — Assessment & Plan Note (Signed)
Under good control on current regimen. Continue current regimen. Continue to monitor. Call with any concerns. Refills given.   

## 2021-05-10 NOTE — Telephone Encounter (Signed)
-----   Message from Valerie Roys, [place] sent at 05/10/2021  4:30 PM EDT ----- ?[facility] Shingles shot ? ?

## 2021-05-10 NOTE — Telephone Encounter (Signed)
Spoke with Ebony Hail from [place] and documented patient Shaun Carney and updated patient chart.  ?

## 2021-05-10 NOTE — Progress Notes (Signed)
? ?BP (!) 108/56   Pulse 66   Temp 98.7 ?F (37.1 ?C) (Oral)   Wt 165 lb (74.8 kg)   SpO2 98%   BMI 25.84 kg/m?   ? ?Subjective:  ? ? Patient ID: Shaun Carney, male    DOB: 11/05/1960, 61 y.o.   MRN: [id number] ? ?HPI: ?Shaun Carney is a 61 y.o. male presenting on 05/10/2021 for comprehensive medical examination. Current medical complaints include: ? ?HYPERTENSION ?Hypertension status: over treated  ?Satisfied with current treatment? yes ?Duration of hypertension: chronic ?BP monitoring frequency:  not checking ?BP medication side effects:  no ?Medication compliance: excellent compliance ?Previous BP meds:amlodipine, benazepril, HCTZ ?Aspirin: no ?Recurrent headaches: no ?Visual changes: no ?Palpitations: no ?Dyspnea: no ?Chest pain: no ?Lower extremity edema: no ?Dizzy/lightheaded: no ? ?GERD ?GERD control status: stable ?Satisfied with current treatment? yes ?Heartburn frequency: rarely ?Medication side effects: no  ?Medication compliance: excellent ?Dysphagia: no ?Odynophagia:  no ?Hematemesis: no ?Blood in stool: no ?EGD: no ? ?He currently lives with: wife ?Interim Problems from his last visit: no ? ?Depression Screen done today and results listed below:  ? ?  06/11/2020  ? 10:45 AM 12/13/2018  ? 10:24 AM 09/21/2016  ?  2:38 PM  ?Depression screen PHQ 2/9  ?Decreased Interest 0 0 0  ?Down, Depressed, Hopeless 0 0 0  ?PHQ - 2 Score 0 0 0  ? ?Past Medical History:  ?Past Medical History:  ?Diagnosis Date  ? Anxiety   ? COVID-19 05/24/2018  ? GERD (gastroesophageal reflux disease)   ? Hypertension   ? ? ?Surgical History:  ?Past Surgical History:  ?Procedure Laterality Date  ? CATARACT EXTRACTION W/PHACO Left 09/29/2016  ? Procedure: CATARACT EXTRACTION PHACO AND INTRAOCULAR LENS PLACEMENT (IOC);  Surgeon: Eulogio Bear, MD;  Location: [facility];  Service: Ophthalmology;  Laterality: Left;  Lot # [id number] H ?US:00:40.4 ?AP%: 7.0 ?CDE: 2.87  ? COLONOSCOPY WITH PROPOFOL N/A 04/03/2020  ? Procedure: COLONOSCOPY WITH  BIOPSY;  Surgeon: Lucilla Lame, MD;  Location: [place];  Service: Endoscopy;  Laterality: N/A;  leave first case  ? ESOPHAGOGASTRODUODENOSCOPY (EGD) WITH PROPOFOL N/A 04/03/2020  ? Procedure: ESOPHAGOGASTRODUODENOSCOPY (EGD) WITH PROPOFOL;  Surgeon: Lucilla Lame, MD;  Location: [place];  Service: Endoscopy;  Laterality: N/A;  ? EYE SURGERY    ? INSERTION OF AHMED VALVE Left 05/18/2015  ? Procedure: INSERTION OF AHMED VALVE AND SCLERAL PATCH GRAFT TO LEFT EYE;  Surgeon: Ronnell Freshwater, MD;  Location: [place];  Service: Ophthalmology;  Laterality: Left;  1ST CASE PER DR VIN  ? POLYPECTOMY N/A 04/03/2020  ? Procedure: POLYPECTOMY;  Surgeon: Lucilla Lame, MD;  Location: [facility];  Service: Endoscopy;  Laterality: N/A;  ? ? ?Medications:  ?Current Outpatient Medications on File Prior to Visit  ?Medication Sig  ? albuterol (VENTOLIN HFA) 108 (90 Base) MCG/ACT inhaler Inhale 2 puffs into the lungs every 6 (six) hours as needed for wheezing or shortness of breath.  ? baclofen (LIORESAL) 10 MG tablet Take 0.5-1 tablets (5-10 mg total) by mouth at bedtime.  ? fluticasone (FLONASE) 50 MCG/ACT nasal spray SHAKE LIQUID AND USE 2 SPRAYS IN EACH NOSTRIL EVERY DAY  ? ?No current facility-administered medications on file prior to visit.  ? ? ?Allergies:  ?No Known Allergies ? ?Social History:  ?Social History  ? ?Socioeconomic History  ? Marital status: Married  ?  Spouse name: Not on file  ? Number of children: Not on file  ? Years of education: Not  on file  ? Highest education level: Not on file  ?Occupational History  ? Not on file  ?Tobacco Use  ? Smoking status: Never  ? Smokeless tobacco: Never  ?Vaping Use  ? Vaping Use: Never used  ?Substance and Sexual Activity  ? Alcohol use: Yes  ?  Alcohol/week: 6.0 standard drinks  ?  Types: 6 Cans of beer per week  ?  Comment: weekends  ? Drug use: No  ? Sexual activity: Yes  ?Other Topics Concern  ? Not on file  ?Social History  Narrative  ? Not on file  ? ?Social Determinants of Health  ? ?Financial Resource Strain: Not on file  ?Food Insecurity: Not on file  ?Transportation Needs: Not on file  ?Physical Activity: Not on file  ?Stress: Not on file  ?Social Connections: Not on file  ?Intimate Partner Violence: Not on file  ? ?Social History  ? ?Tobacco Use  ?Smoking Status Never  ?Smokeless Tobacco Never  ? ?Social History  ? ?Substance and Sexual Activity  ?Alcohol Use Yes  ? Alcohol/week: 6.0 standard drinks  ? Types: 6 Cans of beer per week  ? Comment: weekends  ? ? ?Family History:  ?Family History  ?Problem Relation Age of Onset  ? Cancer Mother   ?     throat  ? Hypertension Mother   ? Heart attack Mother   ? Cancer Father   ?     lung  ? ? ?Past medical history, surgical history, medications, allergies, family history and social history reviewed with patient today and changes made to appropriate areas of the chart.  ? ?Review of Systems  ?Constitutional: Negative.   ?HENT: Negative.    ?Eyes: Negative.   ?Respiratory: Negative.    ?Cardiovascular: Negative.   ?Gastrointestinal: Negative.   ?Genitourinary: Negative.   ?Musculoskeletal: Negative.   ?Skin: Negative.   ?Neurological: Negative.   ?Endo/Heme/Allergies:  Positive for environmental allergies. Negative for polydipsia. Does not bruise/bleed easily.  ?Psychiatric/Behavioral: Negative.    ?All other ROS negative except what is listed above and in the HPI.  ? ?   ?Objective:  ?  ?BP (!) 108/56   Pulse 66   Temp 98.7 ?F (37.1 ?C) (Oral)   Wt 165 lb (74.8 kg)   SpO2 98%   BMI 25.84 kg/m?   ?Wt Readings from Last 3 Encounters:  ?05/10/21 165 lb (74.8 kg)  ?11/06/20 163 lb (73.9 kg)  ?07/30/20 160 lb 3.2 oz (72.7 kg)  ?  ?Physical Exam ?Vitals and nursing note reviewed.  ?Constitutional:   ?   General: He is not in acute distress. ?   Appearance: Normal appearance. He is normal weight. He is not ill-appearing, toxic-appearing or diaphoretic.  ?HENT:  ?   Head: Normocephalic and  atraumatic.  ?   Right Ear: Tympanic membrane, ear canal and external ear normal. There is no impacted cerumen.  ?   Left Ear: Tympanic membrane, ear canal and external ear normal. There is no impacted cerumen.  ?   Nose: Nose normal. No congestion or rhinorrhea.  ?   Mouth/Throat:  ?   Mouth: Mucous membranes are moist.  ?   Pharynx: Oropharynx is clear. No oropharyngeal exudate or posterior oropharyngeal erythema.  ?Eyes:  ?   General: No scleral icterus.    ?   Right eye: No discharge.     ?   Left eye: No discharge.  ?   Extraocular Movements: Extraocular movements intact.  ?   Conjunctiva/sclera:  Conjunctivae normal.  ?   Pupils: Pupils are equal, round, and reactive to light.  ?Neck:  ?   Vascular: No carotid bruit.  ?Cardiovascular:  ?   Rate and Rhythm: Normal rate and regular rhythm.  ?   Pulses: Normal pulses.  ?   Heart sounds: No murmur heard. ?  No friction rub. No gallop.  ?Pulmonary:  ?   Effort: Pulmonary effort is normal. No respiratory distress.  ?   Breath sounds: Normal breath sounds. No stridor. No wheezing, rhonchi or rales.  ?Chest:  ?   Chest wall: No tenderness.  ?Abdominal:  ?   General: Abdomen is flat. Bowel sounds are normal. There is no distension.  ?   Palpations: Abdomen is soft. There is no mass.  ?   Tenderness: There is no abdominal tenderness. There is no right CVA tenderness, left CVA tenderness, guarding or rebound.  ?   Hernia: No hernia is present.  ?Genitourinary: ?   Comments: Genital exam deferred with shared decision making ?Musculoskeletal:     ?   General: No swelling, tenderness, deformity or signs of injury. Normal range of motion.  ?   Cervical back: Normal range of motion and neck supple. No rigidity. No muscular tenderness.  ?   Right lower leg: No edema.  ?   Left lower leg: No edema.  ?Lymphadenopathy:  ?   Cervical: No cervical adenopathy.  ?Skin: ?   General: Skin is warm and dry.  ?   Capillary Refill: Capillary refill takes less than 2 seconds.  ?    Coloration: Skin is not jaundiced or pale.  ?   Findings: No bruising, erythema, lesion or rash.  ?Neurological:  ?   General: No focal deficit present.  ?   Mental Status: He is alert and oriented to person, place, and time

## 2021-05-11 ENCOUNTER — Encounter: Payer: Self-pay | Admitting: Family Medicine

## 2021-05-11 LAB — COMPREHENSIVE METABOLIC PANEL
ALT: 16 IU/L (ref 0–44)
AST: 20 IU/L (ref 0–40)
Albumin/Globulin Ratio: 1.3 (ref 1.2–2.2)
Albumin: 4.2 g/dL (ref 3.8–4.8)
Alkaline Phosphatase: 105 IU/L (ref 44–121)
BUN/Creatinine Ratio: 19 (ref 10–24)
BUN: 22 mg/dL (ref 8–27)
Bilirubin Total: 0.3 mg/dL (ref 0.0–1.2)
CO2: 22 mmol/L (ref 20–29)
Calcium: 9.7 mg/dL (ref 8.6–10.2)
Chloride: 103 mmol/L (ref 96–106)
Creatinine, Ser: 1.15 mg/dL (ref 0.76–1.27)
Globulin, Total: 3.3 g/dL (ref 1.5–4.5)
Glucose: 93 mg/dL (ref 70–99)
Potassium: 4.2 mmol/L (ref 3.5–5.2)
Sodium: 139 mmol/L (ref 134–144)
Total Protein: 7.5 g/dL (ref 6.0–8.5)
eGFR: 72 mL/min/{1.73_m2} (ref >59)

## 2021-05-11 LAB — LIPID PANEL W/O CHOL/HDL RATIO
Cholesterol, Total: 222 mg/dL — ABNORMAL HIGH (ref 100–199)
HDL: 41 mg/dL (ref >39)
LDL Chol Calc (NIH): 129 mg/dL — ABNORMAL HIGH (ref 0–99)
Triglycerides: 294 mg/dL — ABNORMAL HIGH (ref 0–149)
VLDL Cholesterol Cal: 52 mg/dL — ABNORMAL HIGH (ref 5–40)

## 2021-05-11 LAB — CBC WITH DIFFERENTIAL/PLATELET
Basophils Absolute: 0.1 10*3/uL (ref 0.0–0.2)
Basos: 1 %
EOS (ABSOLUTE): 0.2 10*3/uL (ref 0.0–0.4)
Eos: 4 %
Hematocrit: 41.3 % (ref 37.5–51.0)
Hemoglobin: 14.1 g/dL (ref 13.0–17.7)
Immature Grans (Abs): 0 10*3/uL (ref 0.0–0.1)
Immature Granulocytes: 1 %
Lymphocytes Absolute: 1.7 10*3/uL (ref 0.7–3.1)
Lymphs: 30 %
MCH: 29.2 pg (ref 26.6–33.0)
MCHC: 34.1 g/dL (ref 31.5–35.7)
MCV: 86 fL (ref 79–97)
Monocytes Absolute: 0.7 10*3/uL (ref 0.1–0.9)
Monocytes: 12 %
Neutrophils Absolute: 3.1 10*3/uL (ref 1.4–7.0)
Neutrophils: 52 %
Platelets: 336 10*3/uL (ref 150–450)
RBC: 4.83 x10E6/uL (ref 4.14–5.80)
RDW: 11.9 % (ref 11.6–15.4)
WBC: 5.8 10*3/uL (ref 3.4–10.8)

## 2021-05-11 LAB — PSA: Prostate Specific Ag, Serum: 1.4 ng/mL (ref 0.0–4.0)

## 2021-05-11 LAB — TSH: TSH: 4.18 u[IU]/mL (ref 0.450–4.500)

## 2021-05-14 ENCOUNTER — Ambulatory Visit: Payer: Self-pay

## 2021-05-14 NOTE — Telephone Encounter (Signed)
?  Chief Complaint: right knee pain ?Symptoms: pt stated pain comes and goes when using it  ?Frequency: Tuesday ?Pertinent Negatives: Patient denies swelling  ?Disposition: '[]'$ ED /'[x]'$ Urgent Care (no appt availability in office) / '[]'$ Appointment(In office/virtual)/ '[]'$  [place] [facility]/ '[]'$ Home Care/ '[]'$ Refused Recommended Disposition /'[]'$ [place] [facility]/ '[]'$  Follow-up with PCP ?Additional Notes: pt refused to go to [facility] or any UC today- pt advised to go to UC or ED during the weekend if pain is not better 2 hours after pain med-  advised pt to use a cane or walker to prevent falls and non slip shoes   ? ? ? ? ?Reason for Disposition ? [1] SEVERE pain (e.g., excruciating, unable to walk) AND [2] not improved after 2 hours of pain medicine ? ?Answer Assessment - Initial Assessment Questions ?1. LOCATION and RADIATION: "Where is the pain located?"  ?    Side of knee ?2. QUALITY: "What does the pain feel like?"  (e.g., sharp, dull, aching, burning) ?    Sharp pain ?3. SEVERITY: "How bad is the pain?" "What does it keep you from doing?"   (Scale 1-10; or mild, moderate, severe) ?  -  MILD (1-3): doesn't interfere with normal activities  ?  -  MODERATE (4-7): interferes with normal activities (e.g., work or school) or awakens from sleep, limping  ?  -  SEVERE (8-10): excruciating pain, unable to do any normal activities, unable to walk ?    Severe when intermittent ?4. ONSET: "When did the pain start?" "Does it come and go, or is it there all the time?" ?    Tuesday- come and go ?5. RECURRENT: "Have you had this pain before?" If Yes, ask: "When, and what happened then?" ?    no ?6. SETTING: "Has there been any recent work, exercise or other activity that involved that part of the body?"  ?  no ?7. AGGRAVATING FACTORS: "What makes the knee pain worse?" (e.g., walking, climbing stairs, running) ?    Walking  ?8. ASSOCIATED SYMPTOMS: "Is there any swelling or redness of the knee?" ?    no ?9. OTHER SYMPTOMS:  "Do you have any other symptoms?" (e.g., chest pain, difficulty breathing, fever, calf pain) ?    no ?10. PREGNANCY: "Is there any chance you are pregnant?" "When was your last menstrual period?" ?      *No Answer* ? ?Protocols used: Knee Pain-A-AH ? ?

## 2021-05-17 NOTE — Telephone Encounter (Signed)
Spoke with patient and he states he has been taking ibuprofen and his knee has no longer been bothering him. Advised patient to give [place] a call back if pain worsens ?

## 2021-05-26 DIAGNOSIS — H4042X0 Glaucoma secondary to eye inflammation, left eye, stage unspecified: Secondary | ICD-10-CM | POA: Diagnosis not present

## 2021-06-08 ENCOUNTER — Ambulatory Visit: Payer: Self-pay | Admitting: *Deleted

## 2021-06-08 NOTE — Telephone Encounter (Signed)
Corecitin HBP, mucinex ?

## 2021-06-08 NOTE — Telephone Encounter (Signed)
Spoke with patient regarding OTC meds recommended by Dr. Wynetta Emery, pt voiced understanding. Pt voiced understanding.  ?

## 2021-06-08 NOTE — Telephone Encounter (Signed)
Please advise 

## 2021-06-08 NOTE — Telephone Encounter (Signed)
Per agent: "Pt says that he is experiencing sinuses. Pt would like to be advised on what can he take over the counter to help him? Pt says he has congestion." ? ? ?Chief Complaint: Sinus Pressure ?Symptoms: Sinus pressure,eyes, cough at night, runny nose/stuffy nose.cheekbones,forehead ?Frequency: Weekend ?Pertinent Negatives: Patient denies fever ?Disposition: '[]'$ ED /'[]'$ Urgent Care (no appt availability in office) / '[]'$ Appointment(In office/virtual)/ '[]'$  [place] [facility]/ '[x]'$ Home Care/ '[]'$ Refused Recommended Disposition /'[]'$  Mobile Bus/ '[]'$  Follow-up with PCP ?Additional Notes: Pt declines appt. States "I know what this is, just need to know what I can take OTC, I don't have much money." Home care advise per protocol provided, CB if symptoms worsen. Assured pt NT would route to practice for PCPs review and anything to add will receive CB.  ? ?Reason for Disposition ? [1] Sinus congestion as part of a cold AND [2] present < 10 days ? ?Answer Assessment - Initial Assessment Questions ?1. LOCATION: "Where does it hurt?"  ?    Eyes, cheekbone, forehead ?2. ONSET: "When did the sinus pain start?"  (e.g., hours, days)  ?    Last weekend ?3. SEVERITY: "How bad is the pain?"   (Scale 1-10; mild, moderate or severe) ?  - MILD (1-3): doesn't interfere with normal activities  ?  - MODERATE (4-7): interferes with normal activities (e.g., work or school) or awakens from sleep ?  - SEVERE (8-10): excruciating pain and patient unable to do any normal activities    ?    8/10 day  10/10 night ?4. RECURRENT SYMPTOM: "Have you ever had sinus problems before?" If Yes, ask: "When was the last time?" and "What happened that time?"  ?    yes ?5. NASAL CONGESTION: "Is the nose blocked?" If Yes, ask: "Can you open it or must you breathe through your mouth?" ?    Stuffy at times ?6. NASAL DISCHARGE: "Do you have discharge from your nose?" If so ask, "What color?" ?    Yes,yellowish ?7. FEVER: "Do you have a fever?" If Yes, ask:  "What is it, how was it measured, and when did it start?"  ?    No ?8. OTHER SYMPTOMS: "Do you have any other symptoms?" (e.g., sore throat, cough, earache, difficulty breathing) ?    Cough at nighttime only. ? ?Protocols used: Sinus Pain or Congestion-A-AH ? ?

## 2021-06-10 DIAGNOSIS — B301 Conjunctivitis due to adenovirus: Secondary | ICD-10-CM | POA: Diagnosis not present

## 2021-06-22 ENCOUNTER — Telehealth: Payer: Self-pay | Admitting: Family Medicine

## 2021-06-22 ENCOUNTER — Ambulatory Visit: Payer: BC Managed Care – PPO | Admitting: Nurse Practitioner

## 2021-06-22 ENCOUNTER — Encounter: Payer: Self-pay | Admitting: Nurse Practitioner

## 2021-06-22 VITALS — BP 115/75 | HR 78 | Temp 99.3°F | Wt 163.0 lb

## 2021-06-22 DIAGNOSIS — J0110 Acute frontal sinusitis, unspecified: Secondary | ICD-10-CM

## 2021-06-22 MED ORDER — HYDROCOD POLI-CHLORPHE POLI ER 10-8 MG/5ML PO SUER: 5.0000 mL | Freq: Two times a day (BID) | ORAL | 0 refills | Status: DC | PRN

## 2021-06-22 MED ORDER — AMOXICILLIN-POT CLAVULANATE 875-125 MG PO TABS: 1.0000 | ORAL_TABLET | Freq: Two times a day (BID) | ORAL | 0 refills | Status: AC

## 2021-06-22 NOTE — Progress Notes (Signed)
BP 115/75   Pulse 78   Temp 99.3 F (37.4 C) (Oral)   Wt 163 lb (73.9 kg)   SpO2 98%   BMI 25.53 kg/m    Subjective:    Patient ID: Shaun Carney, male    DOB: 1960-04-17, 61 y.o.   MRN: [id number]  HPI: Shaun Carney is a 61 y.o. male  Chief Complaint  Patient presents with   Cough    X 3 days. Sinus congestions, yellow mucous. Denies fever, nausea, emesis.    UPPER RESPIRATORY TRACT INFECTION Worst symptom: Symptoms x 2 days Fever:  99.3 Cough: yes Shortness of breath: no Wheezing: no Chest pain: yes, with cough Chest tightness: no Chest congestion: yes Nasal congestion: yes Runny nose: no Post nasal drip: yes Sneezing: no Sore throat: yes- from coughing Swollen glands: no Sinus pressure: yes Headache: no Face pain: no Toothache: no Ear pain: no bilateral Ear pressure: no bilateral Eyes red/itching:no Eye drainage/crusting: no  Vomiting: no Rash: no Fatigue: yes Sick contacts: no Strep contacts: no  Context: stable Recurrent sinusitis: no Relief with OTC cold/cough medications: no  Treatments attempted: mucinex   Relevant past medical, surgical, family and social history reviewed and updated as indicated. Interim medical history since our last visit reviewed. Allergies and medications reviewed and updated.  Review of Systems  Constitutional:  Positive for fatigue. Negative for fever.  HENT:  Positive for congestion, sinus pressure and sore throat. Negative for ear pain, postnasal drip, rhinorrhea, sinus pain and sneezing.   Respiratory:  Positive for cough. Negative for chest tightness, shortness of breath and wheezing.   Gastrointestinal:  Negative for vomiting.  Skin:  Negative for rash.  Neurological:  Negative for headaches.   Per HPI unless specifically indicated above     Objective:    BP 115/75   Pulse 78   Temp 99.3 F (37.4 C) (Oral)   Wt 163 lb (73.9 kg)   SpO2 98%   BMI 25.53 kg/m   Wt Readings from Last 3 Encounters:   06/22/21 163 lb (73.9 kg)  05/10/21 165 lb (74.8 kg)  11/06/20 163 lb (73.9 kg)    Physical Exam Vitals and nursing note reviewed.  Constitutional:      General: He is not in acute distress.    Appearance: Normal appearance. He is normal weight. He is not ill-appearing, toxic-appearing or diaphoretic.  HENT:     Head: Normocephalic.     Right Ear: Tympanic membrane and external ear normal.     Left Ear: Tympanic membrane and external ear normal.     Nose: Congestion present. No rhinorrhea.     Right Sinus: Frontal sinus tenderness present. No maxillary sinus tenderness.     Left Sinus: Frontal sinus tenderness present. No maxillary sinus tenderness.     Mouth/Throat:     Mouth: Mucous membranes are moist.     Pharynx: Posterior oropharyngeal erythema present. No oropharyngeal exudate.  Eyes:     General:        Right eye: No discharge.        Left eye: No discharge.     Extraocular Movements: Extraocular movements intact.     Conjunctiva/sclera: Conjunctivae normal.     Pupils: Pupils are equal, round, and reactive to light.  Cardiovascular:     Rate and Rhythm: Normal rate and regular rhythm.     Heart sounds: No murmur heard. Pulmonary:     Effort: Pulmonary effort is normal. No respiratory distress.  Breath sounds: Normal breath sounds. No wheezing, rhonchi or rales.  Abdominal:     General: Abdomen is flat. Bowel sounds are normal.  Musculoskeletal:     Cervical back: Normal range of motion and neck supple.  Lymphadenopathy:     Cervical:     Right cervical: No superficial cervical adenopathy.    Left cervical: No superficial cervical adenopathy.  Skin:    General: Skin is warm and dry.     Capillary Refill: Capillary refill takes less than 2 seconds.  Neurological:     General: No focal deficit present.     Mental Status: He is alert and oriented to person, place, and time.  Psychiatric:        Mood and Affect: Mood normal.        Behavior: Behavior normal.         Thought Content: Thought content normal.        Judgment: Judgment normal.    Results for orders placed or performed in visit on 05/10/21  Comprehensive metabolic panel  Result Value Ref Range   Glucose 93 70 - 99 mg/dL   BUN 22 8 - 27 mg/dL   Creatinine, Ser 1.15 0.76 - 1.27 mg/dL   eGFR 72 >59 mL/min/1.73   BUN/Creatinine Ratio 19 10 - 24   Sodium 139 134 - 144 mmol/L   Potassium 4.2 3.5 - 5.2 mmol/L   Chloride 103 96 - 106 mmol/L   CO2 22 20 - 29 mmol/L   Calcium 9.7 8.6 - 10.2 mg/dL   Total Protein 7.5 6.0 - 8.5 g/dL   Albumin 4.2 3.8 - 4.8 g/dL   Globulin, Total 3.3 1.5 - 4.5 g/dL   Albumin/Globulin Ratio 1.3 1.2 - 2.2   Bilirubin Total 0.3 0.0 - 1.2 mg/dL   Alkaline Phosphatase 105 44 - 121 IU/L   AST 20 0 - 40 IU/L   ALT 16 0 - 44 IU/L  CBC with Differential/Platelet  Result Value Ref Range   WBC 5.8 3.4 - 10.8 x10E3/uL   RBC 4.83 4.14 - 5.80 x10E6/uL   Hemoglobin 14.1 13.0 - 17.7 g/dL   Hematocrit 41.3 37.5 - 51.0 %   MCV 86 79 - 97 fL   MCH 29.2 26.6 - 33.0 pg   MCHC 34.1 31.5 - 35.7 g/dL   RDW 11.9 11.6 - 15.4 %   Platelets 336 150 - 450 x10E3/uL   Neutrophils 52 Not Estab. %   Lymphs 30 Not Estab. %   Monocytes 12 Not Estab. %   Eos 4 Not Estab. %   Basos 1 Not Estab. %   Neutrophils Absolute 3.1 1.4 - 7.0 x10E3/uL   Lymphocytes Absolute 1.7 0.7 - 3.1 x10E3/uL   Monocytes Absolute 0.7 0.1 - 0.9 x10E3/uL   EOS (ABSOLUTE) 0.2 0.0 - 0.4 x10E3/uL   Basophils Absolute 0.1 0.0 - 0.2 x10E3/uL   Immature Granulocytes 1 Not Estab. %   Immature Grans (Abs) 0.0 0.0 - 0.1 x10E3/uL  Lipid Panel w/o Chol/HDL Ratio  Result Value Ref Range   Cholesterol, Total 222 (H) 100 - 199 mg/dL   Triglycerides 294 (H) 0 - 149 mg/dL   HDL 41 >39 mg/dL   VLDL Cholesterol Cal 52 (H) 5 - 40 mg/dL   LDL Chol Calc (NIH) 129 (H) 0 - 99 mg/dL  PSA  Result Value Ref Range   Prostate Specific Ag, Serum 1.4 0.0 - 4.0 ng/mL  TSH  Result Value Ref Range   TSH 4.180 0.450 -  4.500  uIU/mL  Urinalysis, Routine w reflex microscopic  Result Value Ref Range   Specific Gravity, UA >1.030 (H) 1.005 - 1.030   pH, UA 5.5 5.0 - 7.5   Color, UA Yellow Yellow   Appearance Ur Clear Clear   Leukocytes,UA Negative Negative   Protein,UA Negative Negative/Trace   Glucose, UA Negative Negative   Ketones, UA Negative Negative   RBC, UA Negative Negative   Bilirubin, UA Negative Negative   Urobilinogen, Ur 0.2 0.2 - 1.0 mg/dL   Nitrite, UA Negative Negative  Microalbumin, Urine Waived  Result Value Ref Range   Microalb, Ur Waived 30 (H) 0 - 19 mg/L   Creatinine, Urine Waived 200 10 - 300 mg/dL   Microalb/Creat Ratio <30 <30 mg/g      Assessment & Plan:   Problem List Items Addressed This Visit   None Visit Diagnoses     Acute non-recurrent frontal sinusitis    -  Primary   Complete course of antibiotics.  Use cough medicine PRN for cough. Discussed side effects and benefits. Follow up if symptoms do not improve.   Relevant Medications   chlorpheniramine-HYDROcodone (TUSSIONEX PENNKINETIC ER) 10-8 MG/5ML   amoxicillin-clavulanate (AUGMENTIN) 875-125 MG tablet        Follow up plan: Return if symptoms worsen or fail to improve.

## 2021-06-22 NOTE — Telephone Encounter (Signed)
Copied from [place] [id number]. Topic: General - Other >> Jun 22, 2021 10:17 AM Pawlus, Brayton Layman A wrote: Reason for CRM: Mal[facility] called in stating chlorpheniramine-HYDROcodone (TUSSIONEX PENNKINETIC ER) 10-8 MG/5ML is only available in 45m and bottle can not be broken up, caller wanted to know if the Rx could be re-sent for 71m please advise.

## 2021-06-22 NOTE — Telephone Encounter (Signed)
Routing to provider who wrote the prescription.

## 2021-07-07 ENCOUNTER — Telehealth: Payer: Self-pay | Admitting: Family Medicine

## 2021-07-07 NOTE — Telephone Encounter (Signed)
Requested medication (s) are due for refill today: yes  Requested medication (s) are on the active medication list: yes  Last refill:  06/22/21 #70 ml 0 refills  Future visit scheduled: na  Notes to clinic:  not delegated. Unable to refill per protocol, not assigned to the refill protocol     Requested Prescriptions  Pending Prescriptions Disp Refills   chlorpheniramine-HYDROcodone (TUSSIONEX PENNKINETIC ER) 10-8 MG/5ML 70 mL 0    Sig: Take 5 mLs by mouth every 12 (twelve) hours as needed for cough.     There is no refill protocol information for this order

## 2021-07-07 NOTE — Telephone Encounter (Signed)
Controlled substance, cannot refuse.

## 2021-07-07 NOTE — Telephone Encounter (Signed)
Medication Refill - Medication: chlorpheniramine-HYDROcodone (TUSSIONEX PENNKINETIC ER) 10-8 MG/5ML  Has the patient contacted their pharmacy? No. Pt states he still has a lingering cough and this medication helped.  Pt would like another refill. He said [facility] was the one that filled b/c Dr Wynetta Emery was out. Preferred Pharmacy (with phone number or street name): [facility] (N), [place]  Has the patient been seen for an appointment in the last year OR does the patient have an upcoming appointment? Yes.    Agent: Please be advised that RX refills may take up to 3 business days. We ask that you follow-up with your pharmacy.

## 2021-07-13 NOTE — Telephone Encounter (Signed)
Patient calling back checking on the status of medication refill request, mentioned below. Patient would like a follow up call today

## 2021-07-14 NOTE — Telephone Encounter (Signed)
Called patient to inform him that he will need to schedule an office visit to get more cough meds. Patient stated that he did not want to come in and be seen in the office for his continued cough.

## 2021-09-15 ENCOUNTER — Other Ambulatory Visit: Payer: Self-pay | Admitting: Family Medicine

## 2021-09-15 DIAGNOSIS — I10 Essential (primary) hypertension: Secondary | ICD-10-CM

## 2021-09-16 NOTE — Telephone Encounter (Signed)
Requested Prescriptions  Pending Prescriptions Disp Refills  . hydrochlorothiazide (HYDRODIURIL) 25 MG tablet [Pharmacy Med Name: hydroCHLOROthiazide 25 MG Oral Tablet] 90 tablet 0    Sig: Take 1 tablet by mouth once daily     Cardiovascular: Diuretics - Thiazide Passed - 09/15/2021  5:20 PM      Passed - Cr in normal range and within 180 days    Creatinine  Date Value Ref Range Status  01/11/2013 1.04 0.60 - 1.30 mg/dL Final   Creatinine, Ser  Date Value Ref Range Status  05/10/2021 1.15 0.76 - 1.27 mg/dL Final         Passed - K in normal range and within 180 days    Potassium  Date Value Ref Range Status  05/10/2021 4.2 3.5 - 5.2 mmol/L Final  01/11/2013 3.7 3.5 - 5.1 mmol/L Final         Passed - Na in normal range and within 180 days    Sodium  Date Value Ref Range Status  05/10/2021 139 134 - 144 mmol/L Final  01/11/2013 137 136 - 145 mmol/L Final         Passed - Last BP in normal range    BP Readings from Last 1 Encounters:  06/22/21 115/75         Passed - Valid encounter within last 6 months    Recent Outpatient Visits          2 months ago Acute non-recurrent frontal sinusitis   [facility] Jon Billings, NP   4 months ago Routine general medical examination at a health care facility   [facility], [place] P, DO   10 months ago Proteinuria, unspecified type   [facility], Lauren A, NP   1 year ago Essential hypertension   [place], [place], DO   1 year ago Benign prostatic hyperplasia with urinary frequency   [place], Megan P, DO             . benazepril (LOTENSIN) 40 MG tablet [Pharmacy Med Name: Benazepril HCl 40 MG Oral Tablet] 90 tablet 0    Sig: Take 1 tablet by mouth once daily     Cardiovascular:  ACE Inhibitors Passed - 09/15/2021  5:20 PM      Passed - Cr in normal range and within 180 days    Creatinine  Date Value Ref Range Status   01/11/2013 1.04 0.60 - 1.30 mg/dL Final   Creatinine, Ser  Date Value Ref Range Status  05/10/2021 1.15 0.76 - 1.27 mg/dL Final         Passed - K in normal range and within 180 days    Potassium  Date Value Ref Range Status  05/10/2021 4.2 3.5 - 5.2 mmol/L Final  01/11/2013 3.7 3.5 - 5.1 mmol/L Final         Passed - Patient is not pregnant      Passed - Last BP in normal range    BP Readings from Last 1 Encounters:  06/22/21 115/75         Passed - Valid encounter within last 6 months    Recent Outpatient Visits          2 months ago Acute non-recurrent frontal sinusitis   [place], NP   4 months ago Routine general medical examination at a health care facility   [facility], Megan P, DO   10 months  ago Proteinuria, unspecified type   [place], NP   1 year ago Essential hypertension   [place], [place], DO   1 year ago Benign prostatic hyperplasia with urinary frequency   [place], [place], DO

## 2021-11-30 ENCOUNTER — Emergency Department
Admission: EM | Admit: 2021-11-30 | Discharge: 2021-11-30 | Disposition: A | Discharge status: Home / Self Care | Payer: BC Managed Care – PPO | Attending: Emergency Medicine | Admitting: Emergency Medicine

## 2021-11-30 ENCOUNTER — Encounter: Payer: Self-pay | Admitting: Emergency Medicine

## 2021-11-30 ENCOUNTER — Other Ambulatory Visit: Payer: Self-pay

## 2021-11-30 DIAGNOSIS — I10 Essential (primary) hypertension: Secondary | ICD-10-CM | POA: Insufficient documentation

## 2021-11-30 DIAGNOSIS — M545 Low back pain, unspecified: Secondary | ICD-10-CM | POA: Insufficient documentation

## 2021-11-30 DIAGNOSIS — M7918 Myalgia, other site: Secondary | ICD-10-CM

## 2021-11-30 LAB — CBC WITH DIFFERENTIAL/PLATELET
Abs Immature Granulocytes: 0.03 10*3/uL (ref 0.00–0.07)
Basophils Absolute: 0.1 10*3/uL (ref 0.0–0.1)
Basophils Relative: 1 %
Eosinophils Absolute: 0.2 10*3/uL (ref 0.0–0.5)
Eosinophils Relative: 3 %
HCT: 41.7 % (ref 39.0–52.0)
Hemoglobin: 13.7 g/dL (ref 13.0–17.0)
Immature Granulocytes: 1 %
Lymphocytes Relative: 27 %
Lymphs Abs: 1.6 10*3/uL (ref 0.7–4.0)
MCH: 28.8 pg (ref 26.0–34.0)
MCHC: 32.9 g/dL (ref 30.0–36.0)
MCV: 87.8 fL (ref 80.0–100.0)
Monocytes Absolute: 0.7 10*3/uL (ref 0.1–1.0)
Monocytes Relative: 11 %
Neutro Abs: 3.5 10*3/uL (ref 1.7–7.7)
Neutrophils Relative %: 57 %
Platelets: 320 10*3/uL (ref 150–400)
RBC: 4.75 MIL/uL (ref 4.22–5.81)
RDW: 12.3 % (ref 11.5–15.5)
WBC: 6.1 10*3/uL (ref 4.0–10.5)
nRBC: 0 % (ref 0.0–0.2)

## 2021-11-30 LAB — COMPREHENSIVE METABOLIC PANEL
ALT: 26 U/L (ref 0–44)
AST: 27 U/L (ref 15–41)
Albumin: 3.9 g/dL (ref 3.5–5.0)
Alkaline Phosphatase: 80 U/L (ref 38–126)
Anion gap: 5 (ref 5–15)
BUN: 23 mg/dL (ref 8–23)
CO2: 28 mmol/L (ref 22–32)
Calcium: 8.9 mg/dL (ref 8.9–10.3)
Chloride: 107 mmol/L (ref 98–111)
Creatinine, Ser: 1.22 mg/dL (ref 0.61–1.24)
GFR, Estimated: >60 mL/min (ref >60)
Glucose, Bld: 114 mg/dL — ABNORMAL HIGH (ref 70–99)
Potassium: 3.4 mmol/L — ABNORMAL LOW (ref 3.5–5.1)
Sodium: 140 mmol/L (ref 135–145)
Total Bilirubin: 1 mg/dL (ref 0.3–1.2)
Total Protein: 8 g/dL (ref 6.5–8.1)

## 2021-11-30 LAB — URINALYSIS, ROUTINE W REFLEX MICROSCOPIC
Bilirubin Urine: NEGATIVE
Glucose, UA: NEGATIVE mg/dL
Hgb urine dipstick: NEGATIVE
Ketones, ur: NEGATIVE mg/dL
Leukocytes,Ua: NEGATIVE
Nitrite: NEGATIVE
Protein, ur: NEGATIVE mg/dL
Specific Gravity, Urine: 1.018 (ref 1.005–1.030)
pH: 5 (ref 5.0–8.0)

## 2021-11-30 MED ORDER — KETOROLAC TROMETHAMINE 30 MG/ML IJ SOLN
30.0000 mg | Freq: Once | INTRAMUSCULAR | Status: AC
  Administered 2021-11-30: 30 mg via INTRAMUSCULAR
  Filled 2021-11-30: qty 1

## 2021-11-30 MED ORDER — KETOROLAC TROMETHAMINE 10 MG PO TABS: 10.0000 mg | ORAL_TABLET | Freq: Four times a day (QID) | ORAL | 0 refills | Status: DC | PRN

## 2021-11-30 MED ORDER — METHOCARBAMOL 500 MG PO TABS: ORAL_TABLET | ORAL | 0 refills | Status: DC

## 2021-11-30 NOTE — ED Provider Notes (Signed)
[facility] Provider Note    Event Date/Time   First MD Initiated Contact with Patient 11/30/21 [phone]     (approximate)   History   Flank Pain   HPI  Shaun Carney is a 61 y.o. male   presents to the ED with complaint of right low back pain both posterior lateral aspect.  Patient is unaware of any particular movement or activity that he did.  He states over the weekend he was on the floor doing exercises and stretching to try and make this better which did not.  He states that he took 4 ibuprofen every 12 hours without any relief.  He denies any urinary symptoms or previous kidney stones.  Pain is worse with movement.  Patient has history of hypertension, GERD, anxiety.      Physical Exam   Triage Vital Signs: ED Triage Vitals [11/30/21 0502]  Enc Vitals Group     BP 127/76     Pulse Rate 70     Resp 20     Temp 97.6 F (36.4 C)     Temp Source Oral     SpO2 98 %     Weight 160 lb (72.6 kg)     Height '5\' 7"'$  (1.702 m)     Head Circumference      Peak Flow      Pain Score 10     Pain Loc      Pain Edu?      Excl. in [place]?     Most recent vital signs: Vitals:   11/30/21 0502 11/30/21 0744  BP: 127/76 120/70  Pulse: 70 70  Resp: 20 18  Temp: 97.6 F (36.4 C)   SpO2: 98% 98%     General: Awake, no distress.  CV:  Good peripheral perfusion.  Resp:  Normal effort.  Abd:  No distention.  Other:  On examination of the back there is no gross deformity and no point tenderness on palpation of the thoracic or lumbar spine.  There is moderate tenderness on palpation of the lateral muscles at the belt line.  Range of motion is restricted with flexion and lateral movement secondary to pain in this area.  Straight leg raises are negative.  Reflexes are 2+ bilaterally.  Patient is able to stand and ambulate without any assistance.   ED Results / Procedures / Treatments   Labs (all labs ordered are listed, but only abnormal results are  displayed) Labs Reviewed  COMPREHENSIVE METABOLIC PANEL - Abnormal; Notable for the following components:      Result Value   Potassium 3.4 (*)    Glucose, Bld 114 (*)    All other components within normal limits  URINALYSIS, ROUTINE W REFLEX MICROSCOPIC - Abnormal; Notable for the following components:   Color, Urine YELLOW (*)    APPearance CLEAR (*)    All other components within normal limits  CBC WITH DIFFERENTIAL/PLATELET      PROCEDURES:  Critical Care performed:   Procedures   MEDICATIONS ORDERED IN ED: Medications  ketorolac (TORADOL) 30 MG/ML injection 30 mg (30 mg Intramuscular Given 11/30/21 0726)     IMPRESSION / MDM / ASSESSMENT AND PLAN / ED COURSE  I reviewed the triage vital signs and the nursing notes.   Differential diagnosis includes, but is not limited to, urinary tract infection, urolithiasis, muscle skeletal strain, low back pain, radiculopathy.  61 year old male presents to the ED with complaint of right lower back pain that began over  the weekend.  Patient is unaware of any particular injury and has been exercising to try and stretch his muscles.  Pain is unrelieved by over-the-counter ibuprofen every 12 hours.  No urinary symptoms and urine was reassuring with negative results.  BC and CMP were essentially negative.  Patient was given an injection of Toradol 30 mg IM while in the ED.  He was made aware that the methocarbamol is a muscle relaxant and that he cannot take this while driving.  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  He will continue with Toradol 10 mg every 6 hours with food and methocarbamol 500 mg 1 or 2 every 6 hours as needed for muscle spasms.  He is encouraged to use ice or heat to his back as needed for discomfort.      Patient's presentation is most consistent with acute complicated illness / injury requiring diagnostic workup.  FINAL CLINICAL IMPRESSION(S) / ED DIAGNOSES    Final diagnoses:  Musculoskeletal pain     Rx / DC Orders   ED Discharge Orders          Ordered    methocarbamol (ROBAXIN) 500 MG tablet        11/30/21 0727    ketorolac (TORADOL) 10 MG tablet  Every 6 hours PRN        11/30/21 3202             Note:  This document was prepared using Dragon voice recognition software and may include unintentional dictation errors.   Johnn Hai, PA-C 11/30/21 3343    Harvest Dark, MD 11/30/21 1459

## 2021-11-30 NOTE — ED Triage Notes (Signed)
Patient ambulatory to triage with steady gait, without difficulty or distress noted; pt reports rt flank/side pain x 2-3 days with no accomp symptoms; denies hx of same

## 2021-11-30 NOTE — Discharge Instructions (Addendum)
Follow-up with your primary care provider if any continued problems or concerns.  You may use ice or heat to your back as needed for discomfort.  The muscle relaxant methocarbamol is 1 or 2 tablets every 6 hours as needed for muscle spasms.  Do not take this medication and drive or operate machinery as it could cause drowsiness.  The ketorolac is an anti-inflammatory and will replace the ibuprofen that you have been taking.  This is 1 tablet every 6 hours with food.  You may take Tylenol with this medication if additional pain medication is needed.  At this time do not do any exercises or stretching as this will aggravate the muscle and increase your pain.

## 2021-11-30 NOTE — ED Notes (Signed)
See triage note  Presents with pain to right flank area  States discomfort started couple of days ago

## 2021-12-03 ENCOUNTER — Encounter: Payer: Self-pay | Admitting: Physician Assistant

## 2021-12-03 ENCOUNTER — Ambulatory Visit: Payer: Self-pay | Admitting: *Deleted

## 2021-12-03 ENCOUNTER — Ambulatory Visit
Admission: RE | Admit: 2021-12-03 | Discharge: 2021-12-03 | Disposition: A | Discharge status: Home / Self Care | Payer: BC Managed Care – PPO | Attending: Physician Assistant | Admitting: Physician Assistant

## 2021-12-03 ENCOUNTER — Ambulatory Visit
Admission: RE | Admit: 2021-12-03 | Discharge: 2021-12-03 | Disposition: A | Discharge status: Home / Self Care | Payer: BC Managed Care – PPO | Source: Ambulatory Visit | Attending: Physician Assistant | Admitting: Physician Assistant

## 2021-12-03 ENCOUNTER — Ambulatory Visit: Payer: BC Managed Care – PPO | Admitting: Physician Assistant

## 2021-12-03 VITALS — BP 119/62 | HR 64 | Temp 98.3°F | Ht 67.01 in | Wt 166.8 lb

## 2021-12-03 DIAGNOSIS — M47816 Spondylosis without myelopathy or radiculopathy, lumbar region: Secondary | ICD-10-CM | POA: Diagnosis not present

## 2021-12-03 DIAGNOSIS — M545 Low back pain, unspecified: Secondary | ICD-10-CM | POA: Diagnosis not present

## 2021-12-03 DIAGNOSIS — R109 Unspecified abdominal pain: Secondary | ICD-10-CM | POA: Diagnosis not present

## 2021-12-03 NOTE — Telephone Encounter (Signed)
Reason for Disposition  [1] SEVERE back pain (e.g., excruciating, unable to do any normal activities) AND [2] not improved 2 hours after pain medicine  Answer Assessment - Initial Assessment Questions 1. ONSET: "When did the pain begin?"      Having severe back pain.   Started last week.   Went to ED last week.  No x rays.  Gave me muscle relaxers.   The pain is getting worse. 2. LOCATION: "Where does it hurt?" (upper, mid or lower back)     Lower back on right side.   I hurt my back 8-9 years ago 3. SEVERITY: "How bad is the pain?"  (e.g., Scale 1-10; mild, moderate, or severe)   - MILD (1-3): Doesn't interfere with normal activities.    - MODERATE (4-7): Interferes with normal activities or awakens from sleep.    - SEVERE (8-10): Excruciating pain, unable to do any normal activities.      Severe 4. PATTERN: "Is the pain constant?" (e.g., yes, no; constant, intermittent)      Yes 5. RADIATION: "Does the pain shoot into your legs or somewhere else?"     No 6. CAUSE:  "What do you think is causing the back pain?"      I hurt my back 8-9 years ago. 7. BACK OVERUSE:  "Any recent lifting of heavy objects, strenuous work or exercise?"     Not asked 8. MEDICINES: "What have you taken so far for the pain?" (e.g., nothing, acetaminophen, NSAIDS)     Muscle relaxers 9. NEUROLOGIC SYMPTOMS: "Do you have any weakness, numbness, or problems with bowel/bladder control?"     No 10. OTHER SYMPTOMS: "Do you have any other symptoms?" (e.g., fever, abdomen pain, burning with urination, blood in urine)       No 11. PREGNANCY: "Is there any chance you are pregnant?" "When was your last menstrual period?"       N/A  Protocols used: Back Pain-A-AH

## 2021-12-03 NOTE — Patient Instructions (Addendum)
  Please go here for your  xray   [place], [place] [address]   You can alternate Tylenol with the ketorolac every 4 hours as needed for pain  Only use the Methocarbamol as needed and before bed as it can be sedating  Based on your symptoms and physical exam I believe the following is the cause of your concern today Back pain likely secondary to a strain of your back muscles  I recommend the following at this time to help relieve that discomfort:   Rest Warm compresses to the area (20 minutes on, minimum of 30 minutes off) You can alternate Tylenol and Ibuprofen for pain management but Ibuprofen is typically preferred to reduce inflammation.  Gentle stretches and exercises that I have included in your paperwork Try to reduce excess strain to the area and rest as much as possible  Wear supportive shoes and, if you must lift anything, use proper lifting techniques that spare your back.   If these measures do not lead to improvement in your symptoms over the next 2-4 weeks please let us know

## 2021-12-03 NOTE — Assessment & Plan Note (Signed)
Acute, new concern Reports 10/10 right lower back and side pain - exacerbated by movement and not relieved with home measures or Kenalog and Robaxin  Will order xrays of lumbar spine and KUB to rule out spinal injuries, fracture and stone Reviewed pain medications and optimizing these for improved pain relief Recommend alternating Kenalog and Tylenol every 4 hours as needed for pain, warm compresses, stretching and massage  Results of imaging to further dictate management approach May need to refer to Ortho and or PT for further evaluation if pain is not resolving  Discussed ED and return precautions  Follow up as needed for persistent or progressing symptoms

## 2021-12-03 NOTE — Progress Notes (Signed)
Acute Office Visit   Patient: Shaun Carney   DOB: 27-Feb-1960   61 y.o. Male  MRN: [id number] Visit Date: 12/03/2021  Today's healthcare provider: Dani Gobble Shaniqwa Horsman, PA-C  Introduced myself to the patient as a Journalist, newspaper and provided education on APPs in clinical practice.    Chief Complaint  Patient presents with   Back Pain    Right side back pain, started last weekend, was in ED on Tuesday   Subjective    Back Pain   HPI     Back Pain    Additional comments: Right side back pain, started last weekend, was in ED on Tuesday      Last edited by Jerelene Redden, CMA on 12/03/2021  9:52 AM.        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   amLODipine (NORVASC) 5 MG tablet Take 1 tablet (5 mg total) by mouth daily.   benazepril (LOTENSIN) 40 MG tablet Take 1 tablet by mouth once daily   clotrimazole-betamethasone  (LOTRISONE) cream APPLY  CREAM TOPICALLY TWICE DAILY   fluticasone (FLONASE) 50 MCG/ACT nasal spray SHAKE LIQUID AND USE 2 SPRAYS IN EACH NOSTRIL EVERY DAY   hydrochlorothiazide (HYDRODIURIL) 25 MG tablet Take 1 tablet by mouth once daily   ketorolac (TORADOL) 10 MG tablet Take 1 tablet (10 mg total) by mouth every 6 (six) hours as needed for moderate pain.   methocarbamol (ROBAXIN) 500 MG tablet 1-2 tablets every 6 hours prn muscle spasms   pantoprazole (PROTONIX) 40 MG tablet Take 1 tablet by mouth once daily   sildenafil (REVATIO) 20 MG tablet Take 1 tablet (20 mg total) by mouth daily as needed.   No facility-administered medications prior to visit.    Review of Systems  Musculoskeletal:  Positive for back pain.       Objective    BP 119/62   Pulse 64   Temp 98.3 F (36.8 C) (Oral)   Ht 5' 7.01" (1.702 m)   Wt 166 lb 12.8 oz (75.7 kg)   SpO2 97%   BMI 26.12 kg/m    Physical Exam Vitals reviewed.  Constitutional:      General: He is awake.     Appearance: Normal appearance. He is well-developed and well-groomed.  HENT:     Head: Normocephalic and  atraumatic.  Musculoskeletal:     Thoracic back: Decreased range of motion.     Lumbar back: Tenderness present. Decreased range of motion. Positive right straight leg raise test.       Back:     Comments: ROM of back and spine reduced due to pain Extension and flexion are extremely limited due to pain Unable to lateral   Neurological:     Mental Status: He is alert.  Psychiatric:        Behavior: Behavior is cooperative.       No results found for any visits on 12/03/21.  Assessment & Plan      No follow-ups on file.       Problem List Items Addressed This Visit       Other   Acute right-sided low back pain without sciatica - Primary    Acute, new concern Reports 10/10 right lower back and side pain - exacerbated by movement and not relieved with home measures or Kenalog and Robaxin  Will order xrays of  lumbar spine and KUB to rule out spinal injuries, fracture and stone Reviewed pain medications and optimizing these for improved pain relief Recommend alternating Kenalog and Tylenol every 4 hours as needed for pain, warm compresses, stretching and massage  Results of imaging to further dictate management approach May need to refer to Ortho and or PT for further evaluation if pain is not resolving  Discussed ED and return precautions  Follow up as needed for persistent or progressing symptoms      Relevant Orders   DG Lumbar Spine Complete   DG Abd 1 View     No follow-ups on file.   I, Grabiela Wohlford E Aitana Burry, PA-C, have reviewed all documentation for this visit. The documentation on 12/03/21 for the exam, diagnosis, procedures, and orders are all accurate and complete.   Talitha Givens, MHS, PA-C [facility]

## 2021-12-03 NOTE — Telephone Encounter (Signed)
  Chief Complaint: Right lower back pain since last week Symptoms: Right lower back pain Frequency: Started last Tues.   Went to ED Wed.  Given muscle relaxers    No x rays done Pertinent Negatives: Patient denies pain down his legs Disposition: '[]'$ ED /'[]'$ Urgent Care (no appt availability in office) / '[x]'$ Appointment(In office/virtual)/ '[]'$  [place] [facility]/ '[]'$ Home Care/ '[]'$ Refused Recommended Disposition /'[]'$ [place] [facility]/ '[]'$  Follow-up with PCP Additional Notes: Appt made for this morning with Talitha Givens, PA-C at [facility] for 10:00.

## 2021-12-06 ENCOUNTER — Telehealth: Payer: Self-pay | Admitting: Family Medicine

## 2021-12-06 ENCOUNTER — Ambulatory Visit: Payer: Self-pay | Admitting: *Deleted

## 2021-12-06 NOTE — Telephone Encounter (Signed)
Returned call to patient to make aware we have not yet got report from xray. Will notify patient of next steps when we get the report back and provider has reviewed xray.

## 2021-12-06 NOTE — Telephone Encounter (Signed)
Message from Oneta Rack sent at 12/06/2021  8:14 AM EST  Summary: Side pain   Patient inquiring about x ray results and states he is in a lot of side pain. Patient states medication prescribed in the ED is not working, Informed patient x ray results have not came back yet and patient states pain is worsening.          Call History   Type Contact Phone/Fax User  12/06/2021 08:14 AM EST Phone (Incoming) Keagen, Heinlen (Self) [phone] Jerilynn Mages) Oneta Rack   Reason for Disposition  [1] SEVERE back pain (e.g., excruciating, unable to do any normal activities) AND [2] not improved 2 hours after pain medicine  Answer Assessment - Initial Assessment Questions 1. ONSET: "When did the pain begin?"      Still having back and side pain on the right side.  Saw Erin Mecum, PA-C on 12/03/2021.   Had x - rays done on Friday.   They have not been read yet.   I'm in so much pain. He was also seen in the ED last Tues. For this pain.   The muscle relaxers and pain medication they gave him in the ED are not helping.   They told me it was possibly kidney stones.   I've never had kidney stones but I'm in a lot of pain.       2. LOCATION: "Where does it hurt?" (upper, mid or lower back)     Right lower back and side. 3. SEVERITY: "How bad is the pain?"  (e.g., Scale 1-10; mild, moderate, or severe)   - MILD (1-3): Doesn't interfere with normal activities.    - MODERATE (4-7): Interferes with normal activities or awakens from sleep.    - SEVERE (8-10): Excruciating pain, unable to do any normal activities.      Severe  4. PATTERN: "Is the pain constant?" (e.g., yes, no; constant, intermittent)      Constant Started last Tues.   No pain with burning or blood in urine.  I am having a hard time getting my stream started.  5. RADIATION: "Does the pain shoot into your legs or somewhere else?"     Not asked 6. CAUSE:  "What do you think is causing the back pain?"      Maybe kidney stones 7. BACK OVERUSE:   "Any recent lifting of heavy objects, strenuous work or exercise?"     Not asked 8. MEDICINES: "What have you taken so far for the pain?" (e.g., nothing, acetaminophen, NSAIDS)     Muscle relaxer and pain medication. 9. NEUROLOGIC SYMPTOMS: "Do you have any weakness, numbness, or problems with bowel/bladder control?"     No 10. OTHER SYMPTOMS: "Do you have any other symptoms?" (e.g., fever, abdomen pain, burning with urination, blood in urine)       No 11. PREGNANCY: "Is there any chance you are pregnant?" "When was your last menstrual period?"       N/A  Protocols used: Back Pain-A-AH

## 2021-12-06 NOTE — Telephone Encounter (Signed)
Pt was advised of message from [place], [place] but pt didn't understand why going back to ED was necessary. I tried to explain to him that they can do further testing on spot and pain control rather than pt waiting on imaging to be ordered. Pt seemed upset and irritated but said at end of call he would go to ED possibly.

## 2021-12-06 NOTE — Telephone Encounter (Signed)
Your xrays have returned. They did not show any acute osseous (bony) injuries or signs of kidney stone. If you are still experiencing severe pain I would recommend going to the ED for further imaging and pain control as there is still a change there may be a kidney stone or something else going on that the xrays cannot show.

## 2021-12-06 NOTE — Telephone Encounter (Signed)
Patient inquiring about x ray results and would like a follow up call. ing on Friday, patient states he would like a follow up call as soon as possible, states side pain

## 2021-12-06 NOTE — Telephone Encounter (Signed)
PT has called back again, re his X-rays, assured him it was in process at this moment. He says never hurt like this before, states has been in ER last wk, NT this am, wants a fu as soon as results are in. Does not want to go to ER till results are in. FU [phone] Denies additional triage. Call to advise

## 2021-12-06 NOTE — Telephone Encounter (Signed)
  Chief Complaint: Continued back pain and side pain.   Wanting to know his x ray results from Friday.   Saw Erin Mecum, PA-C 12/03/2021 Symptoms: Back and side pain continues Frequency: constant Pertinent Negatives: Patient denies blood in urine Disposition: '[]'$ ED /'[]'$ Urgent Care (no appt availability in office) / '[]'$ Appointment(In office/virtual)/ '[]'$  [place] [facility]/ '[]'$ Home Care/ '[]'$ Refused Recommended Disposition /'[]'$ [place] [facility]/ '[x]'$  Follow-up with PCP Additional Notes: Message sent to Talitha Givens, PA-C at [facility].   Pt. Agreeable to someone calling him back.

## 2021-12-07 ENCOUNTER — Emergency Department: Payer: BC Managed Care – PPO

## 2021-12-07 ENCOUNTER — Emergency Department
Admission: EM | Admit: 2021-12-07 | Discharge: 2021-12-07 | Disposition: A | Discharge status: Home / Self Care | Payer: BC Managed Care – PPO | Attending: Emergency Medicine | Admitting: Emergency Medicine

## 2021-12-07 ENCOUNTER — Encounter: Payer: Self-pay | Admitting: Emergency Medicine

## 2021-12-07 ENCOUNTER — Other Ambulatory Visit: Payer: Self-pay

## 2021-12-07 DIAGNOSIS — I10 Essential (primary) hypertension: Secondary | ICD-10-CM | POA: Insufficient documentation

## 2021-12-07 DIAGNOSIS — M545 Low back pain, unspecified: Secondary | ICD-10-CM | POA: Diagnosis not present

## 2021-12-07 DIAGNOSIS — M5459 Other low back pain: Secondary | ICD-10-CM | POA: Diagnosis not present

## 2021-12-07 DIAGNOSIS — Z8616 Personal history of COVID-19: Secondary | ICD-10-CM | POA: Diagnosis not present

## 2021-12-07 DIAGNOSIS — R109 Unspecified abdominal pain: Secondary | ICD-10-CM | POA: Insufficient documentation

## 2021-12-07 DIAGNOSIS — Z79899 Other long term (current) drug therapy: Secondary | ICD-10-CM | POA: Diagnosis not present

## 2021-12-07 LAB — COMPREHENSIVE METABOLIC PANEL
ALT: 30 U/L (ref 0–44)
AST: 27 U/L (ref 15–41)
Albumin: 3.9 g/dL (ref 3.5–5.0)
Alkaline Phosphatase: 80 U/L (ref 38–126)
Anion gap: 7 (ref 5–15)
BUN: 26 mg/dL — ABNORMAL HIGH (ref 8–23)
CO2: 29 mmol/L (ref 22–32)
Calcium: 9.4 mg/dL (ref 8.9–10.3)
Chloride: 103 mmol/L (ref 98–111)
Creatinine, Ser: 1.14 mg/dL (ref 0.61–1.24)
GFR, Estimated: >60 mL/min (ref >60)
Glucose, Bld: 112 mg/dL — ABNORMAL HIGH (ref 70–99)
Potassium: 3.3 mmol/L — ABNORMAL LOW (ref 3.5–5.1)
Sodium: 139 mmol/L (ref 135–145)
Total Bilirubin: 0.8 mg/dL (ref 0.3–1.2)
Total Protein: 8.4 g/dL — ABNORMAL HIGH (ref 6.5–8.1)

## 2021-12-07 LAB — URINALYSIS, ROUTINE W REFLEX MICROSCOPIC
Bilirubin Urine: NEGATIVE
Glucose, UA: NEGATIVE mg/dL
Hgb urine dipstick: NEGATIVE
Ketones, ur: NEGATIVE mg/dL
Leukocytes,Ua: NEGATIVE
Nitrite: NEGATIVE
Protein, ur: NEGATIVE mg/dL
Specific Gravity, Urine: 1.023 (ref 1.005–1.030)
pH: 6 (ref 5.0–8.0)

## 2021-12-07 LAB — CBC WITH DIFFERENTIAL/PLATELET
Abs Immature Granulocytes: 0.02 10*3/uL (ref 0.00–0.07)
Basophils Absolute: 0.1 10*3/uL (ref 0.0–0.1)
Basophils Relative: 1 %
Eosinophils Absolute: 0.3 10*3/uL (ref 0.0–0.5)
Eosinophils Relative: 4 %
HCT: 42.2 % (ref 39.0–52.0)
Hemoglobin: 14.1 g/dL (ref 13.0–17.0)
Immature Granulocytes: 0 %
Lymphocytes Relative: 29 %
Lymphs Abs: 2.2 10*3/uL (ref 0.7–4.0)
MCH: 29 pg (ref 26.0–34.0)
MCHC: 33.4 g/dL (ref 30.0–36.0)
MCV: 86.7 fL (ref 80.0–100.0)
Monocytes Absolute: 0.9 10*3/uL (ref 0.1–1.0)
Monocytes Relative: 11 %
Neutro Abs: 4.1 10*3/uL (ref 1.7–7.7)
Neutrophils Relative %: 55 %
Platelets: 341 10*3/uL (ref 150–400)
RBC: 4.87 MIL/uL (ref 4.22–5.81)
RDW: 12.1 % (ref 11.5–15.5)
WBC: 7.5 10*3/uL (ref 4.0–10.5)
nRBC: 0 % (ref 0.0–0.2)

## 2021-12-07 MED ORDER — PREDNISONE 20 MG PO TABS
60.0000 mg | ORAL_TABLET | Freq: Once | ORAL | Status: AC
  Administered 2021-12-07: 60 mg via ORAL
  Filled 2021-12-07: qty 3

## 2021-12-07 MED ORDER — LIDOCAINE 5 % EX PTCH: 1.0000 | MEDICATED_PATCH | CUTANEOUS | 0 refills | Status: DC

## 2021-12-07 MED ORDER — PREDNISONE 10 MG (21) PO TBPK: ORAL_TABLET | ORAL | 0 refills | Status: DC

## 2021-12-07 MED ORDER — ONDANSETRON 4 MG PO TBDP: 4.0000 mg | ORAL_TABLET | Freq: Four times a day (QID) | ORAL | 0 refills | Status: DC | PRN

## 2021-12-07 MED ORDER — ONDANSETRON 4 MG PO TBDP
4.0000 mg | ORAL_TABLET | Freq: Once | ORAL | Status: AC
  Administered 2021-12-07: 4 mg via ORAL
  Filled 2021-12-07: qty 1

## 2021-12-07 MED ORDER — HYDROCODONE-ACETAMINOPHEN 5-325 MG PO TABS: 2.0000 | ORAL_TABLET | Freq: Four times a day (QID) | ORAL | 0 refills | Status: DC | PRN

## 2021-12-07 MED ORDER — LIDOCAINE 5 % EX PTCH
1.0000 | MEDICATED_PATCH | CUTANEOUS | Status: DC
  Administered 2021-12-07: 1 via TRANSDERMAL
  Filled 2021-12-07: qty 1

## 2021-12-07 MED ORDER — HYDROCODONE-ACETAMINOPHEN 5-325 MG PO TABS
2.0000 | ORAL_TABLET | Freq: Once | ORAL | Status: AC
  Administered 2021-12-07: 2 via ORAL
  Filled 2021-12-07: qty 2

## 2021-12-07 NOTE — ED Provider Notes (Signed)
[facility] Provider Note    Event Date/Time   First MD Initiated Contact with Patient 12/07/21 [phone]     (approximate)   History   Flank Pain   HPI  Shaun Carney is a 61 y.o. male with history of hypertension who presents to the emergency department with 1 week of right lower back pain worse with movement.  Denies any known injury.  No numbness, tingling, weakness, bowel or bladder incontinence, urinary retention, fevers.  No history of HIV, cancer, diabetes or IV drug use.  No previous back surgeries or epidural injections.  Was seen here in the emergency department and given IM Toradol without much relief.  States he has been on ketorolac by mouth, muscle relaxers without relief.  He had outpatient x-rays which were unremarkable.  States his PCP sent him here for further valuation and imaging.   History provided by patient.    Past Medical History:  Diagnosis Date   Anxiety    COVID-19 05/24/2018   GERD (gastroesophageal reflux disease)    Hypertension     Past Surgical History:  Procedure Laterality Date   CATARACT EXTRACTION W/PHACO Left 09/29/2016   Procedure: CATARACT EXTRACTION PHACO AND INTRAOCULAR LENS PLACEMENT ([place]);  Surgeon: Eulogio Bear, MD;  Location: [facility];  Service: Ophthalmology;  Laterality: Left;  Lot # [id number] H US:00:40.4 AP%: 7.0 CDE: 2.87   COLONOSCOPY WITH PROPOFOL N/A 04/03/2020   Procedure: COLONOSCOPY WITH BIOPSY;  Surgeon: Lucilla Lame, MD;  Location: [place];  Service: Endoscopy;  Laterality: N/A;  leave first case   ESOPHAGOGASTRODUODENOSCOPY (EGD) WITH PROPOFOL N/A 04/03/2020   Procedure: ESOPHAGOGASTRODUODENOSCOPY (EGD) WITH PROPOFOL;  Surgeon: Lucilla Lame, MD;  Location: [place];  Service: Endoscopy;  Laterality: N/A;   EYE SURGERY     INSERTION OF AHMED VALVE Left 05/18/2015   Procedure: INSERTION OF AHMED VALVE AND SCLERAL PATCH GRAFT TO LEFT EYE;  Surgeon: Ronnell Freshwater,  MD;  Location: [place];  Service: Ophthalmology;  Laterality: Left;  1ST CASE PER DR VIN   POLYPECTOMY N/A 04/03/2020   Procedure: POLYPECTOMY;  Surgeon: Lucilla Lame, MD;  Location: [place];  Service: Endoscopy;  Laterality: N/A;    MEDICATIONS:  Prior to Admission medications   Medication Sig Start Date End Date Taking? Authorizing Provider  albuterol (VENTOLIN HFA) 108 (90 Base) MCG/ACT inhaler Inhale 2 puffs into the lungs every 6 (six) hours as needed for wheezing or shortness of breath. 11/06/20   McElwee, Lauren A, NP  amLODipine (NORVASC) 5 MG tablet Take 1 tablet (5 mg total) by mouth daily. 05/10/21   Park Liter P, DO  benazepril (LOTENSIN) 40 MG tablet Take 1 tablet by mouth once daily 09/16/21   Johnson, Megan P, DO  clotrimazole-betamethasone (LOTRISONE) cream APPLY  CREAM TOPICALLY TWICE DAILY 05/10/21   Johnson, Megan P, DO  fluticasone (FLONASE) 50 MCG/ACT nasal spray SHAKE LIQUID AND USE 2 SPRAYS IN EACH NOSTRIL EVERY DAY 05/12/18   Guadalupe Maple, MD  hydrochlorothiazide (HYDRODIURIL) 25 MG tablet Take 1 tablet by mouth once daily 09/16/21   Park Liter P, DO  ketorolac (TORADOL) 10 MG tablet Take 1 tablet (10 mg total) by mouth every 6 (six) hours as needed for moderate pain. 11/30/21   Johnn Hai, PA-C  methocarbamol (ROBAXIN) 500 MG tablet 1-2 tablets every 6 hours prn muscle spasms 11/30/21   Letitia Neri L, PA-C  pantoprazole (PROTONIX) 40 MG tablet Take 1 tablet by mouth once daily  05/10/21   Johnson, Megan P, DO  sildenafil (REVATIO) 20 MG tablet Take 1 tablet (20 mg total) by mouth daily as needed. 05/10/21   Valerie Roys, DO    Physical Exam   Triage Vital Signs: ED Triage Vitals [12/07/21 0414]  Enc Vitals Group     BP 129/85     Pulse Rate 60     Resp 16     Temp 98.4 F (36.9 C)     Temp Source Oral     SpO2 99 %     Weight 165 lb (74.8 kg)     Height '5\' 7"'$  (1.702 m)     Head Circumference      Peak Flow      Pain  Score 10     Pain Loc      Pain Edu?      Excl. in [place]?     Most recent vital signs: Vitals:   12/07/21 0414  BP: 129/85  Pulse: 60  Resp: 16  Temp: 98.4 F (36.9 C)  SpO2: 99%    CONSTITUTIONAL: Alert and oriented and responds appropriately to questions. Well-appearing; well-nourished HEAD: Normocephalic, atraumatic EYES: Conjunctivae clear, pupils appear equal, sclera nonicteric ENT: normal nose; moist mucous membranes NECK: Supple, normal ROM CARD: RRR; S1 and S2 appreciated; no murmurs, no clicks, no rubs, no gallops RESP: Normal chest excursion without splinting or tachypnea; breath sounds clear and equal bilaterally; no wheezes, no rhonchi, no rales, no hypoxia or respiratory distress, speaking full sentences ABD/GI: Normal bowel sounds; non-distended; soft, non-tender, no rebound, no guarding, no peritoneal signs BACK: The back appears normal, tender to palpation over the right lumbar paraspinal muscles without redness, warmth, ecchymosis, soft tissue swelling.  No midline spinal tenderness, step-off or deformity. EXT: Normal ROM in all joints; no deformity noted, no edema; no cyanosis SKIN: Normal color for age and race; warm; no rash on exposed skin NEURO: Moves all extremities equally, normal speech, ambulates with slow and steady gait, normal sensation diffusely, no saddle anesthesia, no hyperreflexia or clonus PSYCH: The patient's mood and manner are appropriate.   ED Results / Procedures / Treatments   LABS: (all labs ordered are listed, but only abnormal results are displayed) Labs Reviewed  COMPREHENSIVE METABOLIC PANEL - Abnormal; Notable for the following components:      Result Value   Potassium 3.3 (*)    Glucose, Bld 112 (*)    BUN 26 (*)    Total Protein 8.4 (*)    All other components within normal limits  URINALYSIS, ROUTINE W REFLEX MICROSCOPIC - Abnormal; Notable for the following components:   Color, Urine YELLOW (*)    APPearance CLEAR (*)     All other components within normal limits  CBC WITH DIFFERENTIAL/PLATELET     EKG:  RADIOLOGY: My personal review and interpretation of imaging: CT scan shows no acute abnormality.  I have personally reviewed all radiology reports.   CT Renal Stone Study  Result Date: 12/07/2021 CLINICAL DATA:  Persisting right flank pain EXAM: CT ABDOMEN AND PELVIS WITHOUT CONTRAST TECHNIQUE: Multidetector CT imaging of the abdomen and pelvis was performed following the standard protocol without IV contrast. RADIATION DOSE REDUCTION: This exam was performed according to the departmental dose-optimization program which includes automated exposure control, adjustment of the mA and/or kV according to patient size and/or use of iterative reconstruction technique. COMPARISON:  KUB from 4 days ago.  Abdominal CT 04/28/2020 FINDINGS: Lower chest:  No contributory findings. Hepatobiliary: No  focal liver abnormality.No evidence of biliary obstruction or stone. Pancreas: Unremarkable. Spleen: Unremarkable. Adrenals/Urinary Tract: Negative adrenals. No hydronephrosis or stone. Symmetric perinephric stranding which is non worrisome. Unremarkable bladder. Stomach/Bowel:  No obstruction. No appendicitis. Vascular/Lymphatic: No acute vascular abnormality. No mass or adenopathy. Reproductive:No acute findings. Other: No ascites or pneumoperitoneum. Musculoskeletal: No acute abnormalities. Generalized thoracolumbar spondylosis. IMPRESSION: No explanation for symptoms.  No hydronephrosis or urolithiasis. Electronically Signed   By: Jorje Guild M.D.   On: 12/07/2021 05:00     PROCEDURES:  Critical Care performed:      Procedures    IMPRESSION / MDM / [place] / ED COURSE  I reviewed the triage vital signs and the nursing notes.    Patient here with complaints of right lower back pain.  No focal neurologic deficits.     DIFFERENTIAL DIAGNOSIS (includes but not limited to):   Musculoskeletal pain,  radiculopathy, doubt cauda equina, epidural abscess or hematoma, discitis or osteomyelitis, fracture.  Differential also includes kidney stone, doubt kidney infection.   Patient's presentation is most consistent with acute presentation with potential threat to life or bodily function.   PLAN: Work-up initiated from triage.  No leukocytosis, normal hemoglobin, normal electrolytes and renal function, urine shows no sign of infection or hematuria.  CT renal study reviewed and interpreted by myself and the radiologist and shows no acute abnormality.  No appendicitis, kidneys appear normal.  No ureterolithiasis or hydronephrosis.  Spine also appears normal.  This seems to be more musculoskeletal in nature.  Will discharge with short course of narcotic analgesia as well as a steroid taper and Lidoderm patches.  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  Recommended alternating heat, ice, stretching and no heavy lifting.  Patient has a PCP for close follow-up but states he was not happy with their care and would like for me to provide him with alternative follow-up.  Will give information to establish care with a new PCP.  At this time I do not feel he needs emergent MRI of his back.  Discussed with him at this time that this would be treated and followed up further by an outpatient doctor.  Discussed at length return precautions.   MEDICATIONS GIVEN IN ED: Medications  predniSONE (DELTASONE) tablet 60 mg (has no administration in time range)  HYDROcodone-acetaminophen (NORCO/VICODIN) 5-325 MG per tablet 2 tablet (has no administration in time range)  ondansetron (ZOFRAN-ODT) disintegrating tablet 4 mg (has no administration in time range)  lidocaine (LIDODERM) 5 % 1 patch (has no administration in time range)     ED COURSE:  At this time, I do not feel there is any life-threatening condition present. I reviewed all nursing notes, vitals, pertinent previous records.  All lab and urine  results, EKGs, imaging ordered have been independently reviewed and interpreted by myself.  I reviewed all available radiology reports from any imaging ordered this visit.  Based on my assessment, I feel the patient is safe to be discharged home without further emergent workup and can continue workup as an outpatient as needed. Discussed all findings, treatment plan as well as usual and customary return precautions.  They verbalize understanding and are comfortable with this plan.  Outpatient follow-up has been provided as needed.  All questions have been answered.    CONSULTS:  none   OUTSIDE RECORDS REVIEWED: Reviewed patient's recent PCP notes.       FINAL CLINICAL IMPRESSION(S) / ED DIAGNOSES   Final  diagnoses:  Acute right-sided low back pain, unspecified whether sciatica present     Rx / DC Orders   ED Discharge Orders          Ordered    HYDROcodone-acetaminophen (NORCO/VICODIN) 5-325 MG tablet  Every 6 hours PRN        12/07/21 0612    lidocaine (LIDODERM) 5 %  Every 24 hours        12/07/21 0612    ondansetron (ZOFRAN-ODT) 4 MG disintegrating tablet  Every 6 hours PRN        12/07/21 0612    predniSONE (STERAPRED UNI-PAK 21 TAB) 10 MG (21) TBPK tablet        12/07/21 0612             Note:  This document was prepared using Dragon voice recognition software and may include unintentional dictation errors.   Janiece Scovill, Delice Bison, DO 12/07/21 [phone]

## 2021-12-07 NOTE — Telephone Encounter (Signed)
See other TE.

## 2021-12-07 NOTE — Telephone Encounter (Signed)
Patient had ED visit.

## 2021-12-07 NOTE — ED Triage Notes (Signed)
Patient ambulatory to triage with steady gait, without difficulty or distress noted; pt returns for persistent rt flank pain, nonradiating accomp by diff urinating; taking rx meds without relief

## 2021-12-07 NOTE — Discharge Instructions (Addendum)
You are being provided a prescription for opiates (also known as narcotics) for pain control.  Opiates can be addictive and should only be used when absolutely necessary for pain control when other alternatives do not work.  We recommend you only use them for the recommended amount of time and only as prescribed.  Please do not take with other sedative medications or alcohol.  Please do not drive, operate machinery, make important decisions while taking opiates.  Please note that these medications can be addictive and have high abuse potential.  Patients can become addicted to narcotics after only taking them for a few days.  Please keep these medications locked away from children, teenagers or any family members with history of substance abuse.  Narcotic pain medicine may also make you constipated.  You may use over-the-counter medications such as MiraLAX, Colace to prevent constipation.  If you become constipated, you may use over-the-counter enemas as needed.  Itching and nausea are also common side effects of narcotic pain medication.  If you develop uncontrolled vomiting or a rash, please stop these medications and seek medical care.   Steps to find a Primary Care Provider (PCP):  Call [phone] or [phone] to access "[place] [facility] Doctor Service."  2.  You may also go on the [facility] website at [email or web address]

## 2021-12-14 ENCOUNTER — Other Ambulatory Visit: Payer: Self-pay | Admitting: Family Medicine

## 2021-12-14 ENCOUNTER — Telehealth: Payer: Self-pay | Admitting: Family Medicine

## 2021-12-14 DIAGNOSIS — M545 Low back pain, unspecified: Secondary | ICD-10-CM

## 2021-12-14 DIAGNOSIS — I10 Essential (primary) hypertension: Secondary | ICD-10-CM

## 2021-12-14 NOTE — Telephone Encounter (Signed)
Copied from [place] [phone]. Topic: Referral - Request for Referral >> Dec 14, 2021  9:03 AM Shaun Carney wrote: Has patient seen PCP for this complaint? Yes.   *If NO, is insurance requiring patient see PCP for this issue before PCP can refer them? Referral for which specialty: spine and back specialist  Preferred provider/office: patient has no preference  Reason for referral: back discomfort

## 2021-12-14 NOTE — Telephone Encounter (Signed)
Routing to provider to advise. Patient requesting MRI for his back.

## 2021-12-14 NOTE — Telephone Encounter (Signed)
Routing to provider to advise. Patient seen earlier in November for back pain, went to the ER 12/07/21. Does he need to be seen for referral?

## 2021-12-14 NOTE — Telephone Encounter (Signed)
Would need appt for that. Referral to ortho placed.

## 2021-12-14 NOTE — Telephone Encounter (Signed)
Copied from [place] [id number]. Topic: General - Other >> Dec 14, 2021  9:01 AM Everette C wrote: Reason for CRM: The patient would like orders for an MRI the patient shares that they would like to have an MRI done at a facility with an "open MRI machine" because they cannot stay in tight spaces for long periods of time   Please contact the patient further when the imaging orders are created

## 2021-12-14 NOTE — Telephone Encounter (Signed)
Requested Prescriptions  Pending Prescriptions Disp Refills   hydrochlorothiazide (HYDRODIURIL) 25 MG tablet [Pharmacy Med Name: hydroCHLOROthiazide 25 MG Oral Tablet] 90 tablet 0    Sig: Take 1 tablet by mouth once daily     Cardiovascular: Diuretics - Thiazide Failed - 12/14/2021 10:51 AM      Failed - K in normal range and within 180 days    Potassium  Date Value Ref Range Status  12/07/2021 3.3 (L) 3.5 - 5.1 mmol/L Final  01/11/2013 3.7 3.5 - 5.1 mmol/L Final         Passed - Cr in normal range and within 180 days    Creatinine  Date Value Ref Range Status  01/11/2013 1.04 0.60 - 1.30 mg/dL Final   Creatinine, Ser  Date Value Ref Range Status  12/07/2021 1.14 0.61 - 1.24 mg/dL Final         Passed - Na in normal range and within 180 days    Sodium  Date Value Ref Range Status  12/07/2021 139 135 - 145 mmol/L Final  05/10/2021 139 134 - 144 mmol/L Final  01/11/2013 137 136 - 145 mmol/L Final         Passed - Last BP in normal range    BP Readings from Last 1 Encounters:  12/07/21 125/63         Passed - Valid encounter within last 6 months    Recent Outpatient Visits           1 week ago Acute right-sided low back pain without sciatica   [facility] Mecum, Erin E, PA-C   5 months ago Acute non-recurrent frontal sinusitis   [facility] Jon Billings, NP   7 months ago Routine general medical examination at a health care facility   [facility], [place], DO   1 year ago Proteinuria, unspecified type   [facility], Lauren A, NP   1 year ago Essential hypertension   [facility] Exline, Megan P, DO               benazepril (LOTENSIN) 40 MG tablet [Pharmacy Med Name: Benazepril HCl 40 MG Oral Tablet] 90 tablet 0    Sig: Take 1 tablet by mouth once daily     Cardiovascular:  ACE Inhibitors Failed - 12/14/2021 10:51 AM      Failed - K in normal range and within 180 days     Potassium  Date Value Ref Range Status  12/07/2021 3.3 (L) 3.5 - 5.1 mmol/L Final  01/11/2013 3.7 3.5 - 5.1 mmol/L Final         Passed - Cr in normal range and within 180 days    Creatinine  Date Value Ref Range Status  01/11/2013 1.04 0.60 - 1.30 mg/dL Final   Creatinine, Ser  Date Value Ref Range Status  12/07/2021 1.14 0.61 - 1.24 mg/dL Final         Passed - Patient is not pregnant      Passed - Last BP in normal range    BP Readings from Last 1 Encounters:  12/07/21 125/63         Passed - Valid encounter within last 6 months    Recent Outpatient Visits           1 week ago Acute right-sided low back pain without sciatica   [facility] Mecum, Erin E, PA-C   5 months ago Acute non-recurrent frontal sinusitis  [place], NP   7 months ago Routine general medical examination at a health care facility   [facility], White, DO   1 year ago Proteinuria, unspecified type   [facility], Scheryl Darter, NP   1 year ago Essential hypertension   [place], [place], DO

## 2021-12-15 NOTE — Telephone Encounter (Signed)
Pt scheduled 11/27

## 2021-12-20 ENCOUNTER — Encounter: Payer: Self-pay | Admitting: Physician Assistant

## 2021-12-20 ENCOUNTER — Ambulatory Visit: Payer: BC Managed Care – PPO | Admitting: Physician Assistant

## 2021-12-20 VITALS — BP 94/56 | HR 81 | Temp 98.7°F | Wt 162.8 lb

## 2021-12-20 DIAGNOSIS — R300 Dysuria: Secondary | ICD-10-CM

## 2021-12-20 DIAGNOSIS — M545 Low back pain, unspecified: Secondary | ICD-10-CM

## 2021-12-20 LAB — MICROSCOPIC EXAMINATION
Bacteria, UA: NONE SEEN
Epithelial Cells (non renal): NONE SEEN /hpf (ref 0–10)
RBC, Urine: NONE SEEN /hpf (ref 0–2)
WBC, UA: NONE SEEN /hpf (ref 0–5)

## 2021-12-20 LAB — URINALYSIS, ROUTINE W REFLEX MICROSCOPIC
Bilirubin, UA: NEGATIVE
Glucose, UA: NEGATIVE
Leukocytes,UA: NEGATIVE
Nitrite, UA: NEGATIVE
RBC, UA: NEGATIVE
Specific Gravity, UA: 1.02 (ref 1.005–1.030)
Urobilinogen, Ur: 4 mg/dL — ABNORMAL HIGH (ref 0.2–1.0)
pH, UA: 6.5 (ref 5.0–7.5)

## 2021-12-20 MED ORDER — DICLOFENAC SODIUM 75 MG PO TBEC: 75.0000 mg | DELAYED_RELEASE_TABLET | Freq: Two times a day (BID) | ORAL | 0 refills | Status: DC

## 2021-12-20 NOTE — Patient Instructions (Addendum)
Please be on the lookout for a phone call from our referral team to schedule your Orthopedics apt or you can call this number to schedule an apt at your convenience [phone]   I am sending in a script for Diclofenac - take this instead of Ibuprofen. You can continue to take Tylenol as needed as well.   You can continue to use the medications provided from the ED and use warm/ cold compresses as needed to assist with pain  I have included some exercises for you to do to help with your back pain and prevent it from getting stiff.

## 2021-12-20 NOTE — Assessment & Plan Note (Signed)
Acute, ongoing concern Patient has had xrays of lumbar spine and KUB as well as CT stone study without significant findings  Reports pain is still 10/10 and Norco, muscle relaxers, and prednisone, lidocaine patches have not provided much relief Suspect pain is likely muscular in nature due to aggravation with movements  Will provide script for Diclofenac 75 mg PO BID - recommend supplementing with Tylenol and back exercises Offered to place referral to PT for him today but he states he would like to wait to see Ortho provider first and for them to refer if they believe it is indicated  Follow up as needed for progressing or persistent symptoms

## 2021-12-20 NOTE — Progress Notes (Signed)
Established Patient Office Visit  Name: Shaun Carney   MRN: [id number]    DOB: 06-07-1960   Date:12/20/2021  Today's Provider: Talitha Givens, MHS, PA-C Introduced myself to the patient as a PA-C and provided education on APPs in clinical practice.         Subjective  Chief Complaint  Chief Complaint  Patient presents with   Back Pain    Patient states his lower back is still hurting, worse on the right side. Would like to request MRI.    Dysuria    Patient states for a few days he's had with burning with urination. Per patient he feels like he is straining himself to urinate.     HPI  Acute right-sided low back pain (chart review) Was seen for this again in ED on 12/07/21- CT renal was completed and did not show evidence of stone nor acute spinal process  He was provided with Norco, Lidocaine patches and Prednisone taper on discharge    Acute right-sided back pain  Location: lower right sided pain  Pain level and character: 10/10 -sharp and achy Alleviating: standing, having a pillow behind his lower back  Aggravating: sitting, turning Interventions: Norco, muscle relaxers, prednisone, lidocaine patches - these do not seem to be providing benefit per patient  States he would like a MRI but needs it to be open-faced due to claustrophobia   Dysuria States he feels like he has to strain to urinate and notes stream is weak  Onset: sudden  Duration: this week  Associated symptoms: Interventions: none     Patient Active Problem List   Diagnosis Date Noted   Acute right-sided low back pain without sciatica 12/03/2021   Polyp of colon    GERD (gastroesophageal reflux disease)    Plantar fasciitis 12/27/2017   Chondromalacia patellae 01/18/2017   ED (erectile dysfunction) 06/10/2015   Essential hypertension 05/13/2015    Past Surgical History:  Procedure Laterality Date   CATARACT EXTRACTION W/PHACO Left 09/29/2016   Procedure: CATARACT EXTRACTION PHACO AND  INTRAOCULAR LENS PLACEMENT ([place]);  Surgeon: Eulogio Bear, MD;  Location: [facility];  Service: Ophthalmology;  Laterality: Left;  Lot # [id number] H US:00:40.4 AP%: 7.0 CDE: 2.87   COLONOSCOPY WITH PROPOFOL N/A 04/03/2020   Procedure: COLONOSCOPY WITH BIOPSY;  Surgeon: Lucilla Lame, MD;  Location: [place];  Service: Endoscopy;  Laterality: N/A;  leave first case   ESOPHAGOGASTRODUODENOSCOPY (EGD) WITH PROPOFOL N/A 04/03/2020   Procedure: ESOPHAGOGASTRODUODENOSCOPY (EGD) WITH PROPOFOL;  Surgeon: Lucilla Lame, MD;  Location: [place];  Service: Endoscopy;  Laterality: N/A;   EYE SURGERY     INSERTION OF AHMED VALVE Left 05/18/2015   Procedure: INSERTION OF AHMED VALVE AND SCLERAL PATCH GRAFT TO LEFT EYE;  Surgeon: Ronnell Freshwater, MD;  Location: [place];  Service: Ophthalmology;  Laterality: Left;  1ST CASE PER DR VIN   POLYPECTOMY N/A 04/03/2020   Procedure: POLYPECTOMY;  Surgeon: Lucilla Lame, MD;  Location: [place];  Service: Endoscopy;  Laterality: N/A;    Family History  Problem Relation Age of Onset   Cancer Mother        throat   Hypertension Mother    Heart attack Mother    Cancer Father        lung    Social History   Tobacco Use   Smoking status: Never   Smokeless tobacco: Never  Substance Use Topics   Alcohol use: Yes  Alcohol/week: 6.0 standard drinks of alcohol    Types: 6 Cans of beer per week    Comment: weekends     Current Outpatient Medications:    diclofenac (VOLTAREN) 75 MG EC tablet, Take 1 tablet (75 mg total) by mouth 2 (two) times daily., Disp: 30 tablet, Rfl: 0   albuterol (VENTOLIN HFA) 108 (90 Base) MCG/ACT inhaler, Inhale 2 puffs into the lungs every 6 (six) hours as needed for wheezing or shortness of breath., Disp: 8 g, Rfl: 1   amLODipine (NORVASC) 5 MG tablet, Take 1 tablet (5 mg total) by mouth daily., Disp: 90 tablet, Rfl: 3   benazepril (LOTENSIN) 40 MG tablet, Take 1 tablet by mouth once  daily, Disp: 90 tablet, Rfl: 0   clotrimazole-betamethasone (LOTRISONE) cream, APPLY  CREAM TOPICALLY TWICE DAILY, Disp: 30 g, Rfl: 1   fluticasone (FLONASE) 50 MCG/ACT nasal spray, SHAKE LIQUID AND USE 2 SPRAYS IN EACH NOSTRIL EVERY DAY, Disp: 16 g, Rfl: 0   hydrochlorothiazide (HYDRODIURIL) 25 MG tablet, Take 1 tablet by mouth once daily, Disp: 90 tablet, Rfl: 0   ketorolac (TORADOL) 10 MG tablet, Take 1 tablet (10 mg total) by mouth every 6 (six) hours as needed for moderate pain., Disp: 15 tablet, Rfl: 0   lidocaine (LIDODERM) 5 %, Place 1 patch onto the skin daily. Remove & Discard patch within 12 hours or as directed by MD, Disp: 15 patch, Rfl: 0   ondansetron (ZOFRAN-ODT) 4 MG disintegrating tablet, Take 1 tablet (4 mg total) by mouth every 6 (six) hours as needed for nausea or vomiting., Disp: 20 tablet, Rfl: 0   pantoprazole (PROTONIX) 40 MG tablet, Take 1 tablet by mouth once daily, Disp: 90 tablet, Rfl: 1   sildenafil (REVATIO) 20 MG tablet, Take 1 tablet (20 mg total) by mouth daily as needed., Disp: 50 tablet, Rfl: 12  No Known Allergies  I personally reviewed active problem list, medication list, allergies, notes from last encounter, notes from last 3 encounters, lab results, imaging with the patient/caregiver today.   Review of Systems  Constitutional:  Negative for chills and fever.  Genitourinary:  Positive for dysuria. Negative for frequency and urgency.  Musculoskeletal:  Positive for back pain and myalgias. Negative for falls and joint pain.  Neurological:  Negative for dizziness, tingling, focal weakness, loss of consciousness and weakness.      Objective  Vitals:   12/20/21 1000  BP: (!) 94/56  Pulse: 81  Temp: 98.7 F (37.1 C)  SpO2: 97%  Weight: 162 lb 12.8 oz (73.8 kg)    Body mass index is 25.5 kg/m.  Physical Exam Vitals reviewed.  Constitutional:      General: He is awake.     Appearance: Normal appearance. He is well-developed and well-groomed.   HENT:     Head: Normocephalic and atraumatic.  Pulmonary:     Effort: Pulmonary effort is normal.     Breath sounds: Normal breath sounds. No decreased air movement. No decreased breath sounds, wheezing, rhonchi or rales.  Musculoskeletal:     Cervical back: No swelling. Normal range of motion.     Thoracic back: Normal range of motion.     Lumbar back: Tenderness present. No swelling or deformity. Decreased range of motion.       Back:  Neurological:     Mental Status: He is alert.  Psychiatric:        Behavior: Behavior is cooperative.      Recent Results (from the past 2160  hour(s))  CBC with Differential     Status: None   Collection Time: 11/30/21  5:03 AM  Result Value Ref Range   WBC 6.1 4.0 - 10.5 K/uL   RBC 4.75 4.22 - 5.81 MIL/uL   Hemoglobin 13.7 13.0 - 17.0 g/dL   HCT 41.7 39.0 - 52.0 %   MCV 87.8 80.0 - 100.0 fL   MCH 28.8 26.0 - 34.0 pg   MCHC 32.9 30.0 - 36.0 g/dL   RDW 12.3 11.5 - 15.5 %   Platelets 320 150 - 400 K/uL   nRBC 0.0 0.0 - 0.2 %   Neutrophils Relative % 57 %   Neutro Abs 3.5 1.7 - 7.7 K/uL   Lymphocytes Relative 27 %   Lymphs Abs 1.6 0.7 - 4.0 K/uL   Monocytes Relative 11 %   Monocytes Absolute 0.7 0.1 - 1.0 K/uL   Eosinophils Relative 3 %   Eosinophils Absolute 0.2 0.0 - 0.5 K/uL   Basophils Relative 1 %   Basophils Absolute 0.1 0.0 - 0.1 K/uL   Immature Granulocytes 1 %   Abs Immature Granulocytes 0.03 0.00 - 0.07 K/uL    Comment: Performed at [facility], [address] [place], [place] [address]  Comprehensive metabolic panel     Status: Abnormal   Collection Time: 11/30/21  5:03 AM  Result Value Ref Range   Sodium 140 135 - 145 mmol/L   Potassium 3.4 (L) 3.5 - 5.1 mmol/L   Chloride 107 98 - 111 mmol/L   CO2 28 22 - 32 mmol/L   Glucose, Bld 114 (H) 70 - 99 mg/dL    Comment: Glucose reference range applies only to samples taken after fasting for at least 8 hours.   BUN 23 8 - 23 mg/dL   Creatinine, Ser 1.22 0.61 -  1.24 mg/dL   Calcium 8.9 8.9 - 10.3 mg/dL   Total Protein 8.0 6.5 - 8.1 g/dL   Albumin 3.9 3.5 - 5.0 g/dL   AST 27 15 - 41 U/L   ALT 26 0 - 44 U/L   Alkaline Phosphatase 80 38 - 126 U/L   Total Bilirubin 1.0 0.3 - 1.2 mg/dL   GFR, Estimated >60 >60 mL/min    Comment: (NOTE) Calculated using the CKD-EPI Creatinine Equation (2021)    Anion gap 5 5 - 15    Comment: Performed at [facility], [address] [place], [place] [address]  Urinalysis, Routine w reflex microscopic     Status: Abnormal   Collection Time: 11/30/21  5:03 AM  Result Value Ref Range   Color, Urine YELLOW (A) YELLOW   APPearance CLEAR (A) CLEAR   Specific Gravity, Urine 1.018 1.005 - 1.030   pH 5.0 5.0 - 8.0   Glucose, UA NEGATIVE NEGATIVE mg/dL   Hgb urine dipstick NEGATIVE NEGATIVE   Bilirubin Urine NEGATIVE NEGATIVE   Ketones, ur NEGATIVE NEGATIVE mg/dL   Protein, ur NEGATIVE NEGATIVE mg/dL   Nitrite NEGATIVE NEGATIVE   Leukocytes,Ua NEGATIVE NEGATIVE    Comment: Performed at [facility], [address] [place], [place] [address]  CBC with Differential     Status: None   Collection Time: 12/07/21  4:15 AM  Result Value Ref Range   WBC 7.5 4.0 - 10.5 K/uL   RBC 4.87 4.22 - 5.81 MIL/uL   Hemoglobin 14.1 13.0 - 17.0 g/dL   HCT 42.2 39.0 - 52.0 %   MCV 86.7 80.0 - 100.0 fL   MCH 29.0 26.0 -  34.0 pg   MCHC 33.4 30.0 - 36.0 g/dL   RDW 12.1 11.5 - 15.5 %   Platelets 341 150 - 400 K/uL   nRBC 0.0 0.0 - 0.2 %   Neutrophils Relative % 55 %   Neutro Abs 4.1 1.7 - 7.7 K/uL   Lymphocytes Relative 29 %   Lymphs Abs 2.2 0.7 - 4.0 K/uL   Monocytes Relative 11 %   Monocytes Absolute 0.9 0.1 - 1.0 K/uL   Eosinophils Relative 4 %   Eosinophils Absolute 0.3 0.0 - 0.5 K/uL   Basophils Relative 1 %   Basophils Absolute 0.1 0.0 - 0.1 K/uL   Immature Granulocytes 0 %   Abs Immature Granulocytes 0.02 0.00 - 0.07 K/uL    Comment: Performed at [facility], [address]  [place], [place] [address]  Comprehensive metabolic panel     Status: Abnormal   Collection Time: 12/07/21  4:15 AM  Result Value Ref Range   Sodium 139 135 - 145 mmol/L   Potassium 3.3 (L) 3.5 - 5.1 mmol/L   Chloride 103 98 - 111 mmol/L   CO2 29 22 - 32 mmol/L   Glucose, Bld 112 (H) 70 - 99 mg/dL    Comment: Glucose reference range applies only to samples taken after fasting for at least 8 hours.   BUN 26 (H) 8 - 23 mg/dL   Creatinine, Ser 1.14 0.61 - 1.24 mg/dL   Calcium 9.4 8.9 - 10.3 mg/dL   Total Protein 8.4 (H) 6.5 - 8.1 g/dL   Albumin 3.9 3.5 - 5.0 g/dL   AST 27 15 - 41 U/L   ALT 30 0 - 44 U/L   Alkaline Phosphatase 80 38 - 126 U/L   Total Bilirubin 0.8 0.3 - 1.2 mg/dL   GFR, Estimated >60 >60 mL/min    Comment: (NOTE) Calculated using the CKD-EPI Creatinine Equation (2021)    Anion gap 7 5 - 15    Comment: Performed at [facility], [address] [place], [place] [address]  Urinalysis, Routine w reflex microscopic     Status: Abnormal   Collection Time: 12/07/21  4:15 AM  Result Value Ref Range   Color, Urine YELLOW (A) YELLOW   APPearance CLEAR (A) CLEAR   Specific Gravity, Urine 1.023 1.005 - 1.030   pH 6.0 5.0 - 8.0   Glucose, UA NEGATIVE NEGATIVE mg/dL   Hgb urine dipstick NEGATIVE NEGATIVE   Bilirubin Urine NEGATIVE NEGATIVE   Ketones, ur NEGATIVE NEGATIVE mg/dL   Protein, ur NEGATIVE NEGATIVE mg/dL   Nitrite NEGATIVE NEGATIVE   Leukocytes,Ua NEGATIVE NEGATIVE    Comment: Performed at [facility], [address] [place], [place] [address]  Urinalysis, Routine w reflex microscopic     Status: Abnormal   Collection Time: 12/20/21 10:07 AM  Result Value Ref Range   Specific Gravity, UA 1.020 1.005 - 1.030   pH, UA 6.5 5.0 - 7.5   Color, UA Yellow Yellow   Appearance Ur Clear Clear   Leukocytes,UA Negative Negative   Protein,UA 1+ (A) Negative/Trace   Glucose, UA Negative Negative   Ketones, UA Trace (A) Negative   RBC, UA Negative  Negative   Bilirubin, UA Negative Negative   Urobilinogen, Ur 4.0 (H) 0.2 - 1.0 mg/dL   Nitrite, UA Negative Negative   Microscopic Examination See below:   Microscopic Examination     Status: Abnormal   Collection Time: 12/20/21 10:07 AM   Urine  Result Value Ref  Range   WBC, UA None seen 0 - 5 /hpf   RBC, Urine None seen 0 - 2 /hpf   Epithelial Cells (non renal) None seen 0 - 10 /hpf   Mucus, UA Present (A) Not Estab.   Bacteria, UA None seen None seen/Few     PHQ2/9:    06/22/2021    9:44 AM 06/11/2020   10:45 AM 12/13/2018   10:24 AM 09/21/2016    2:38 PM  Depression screen PHQ 2/9  Decreased Interest 0 0 0 0  Down, Depressed, Hopeless 0 0 0 0  PHQ - 2 Score 0 0 0 0  Altered sleeping 0     Tired, decreased energy 0     Change in appetite 0     Feeling bad or failure about yourself  0     Trouble concentrating 0     Moving slowly or fidgety/restless 0     Suicidal thoughts 0     PHQ-9 Score 0     Difficult doing work/chores Not difficult at all         Fall Risk:    06/22/2021    9:44 AM 01/18/2017    2:55 PM 09/21/2016    2:38 PM  Fall Risk   Falls in the past year? 0 No No  Number falls in past yr: 0    Injury with Fall? 0    Risk for fall due to : No Fall Risks    Follow up Falls evaluation completed           Assessment & Plan  Problem List Items Addressed This Visit       Other   Acute right-sided low back pain without sciatica - Primary    Acute, ongoing concern Patient has had xrays of lumbar spine and KUB as well as CT stone study without significant findings  Reports pain is still 10/10 and Norco, muscle relaxers, and prednisone, lidocaine patches have not provided much relief Suspect pain is likely muscular in nature due to aggravation with movements  Will provide script for Diclofenac 75 mg PO BID - recommend supplementing with Tylenol and back exercises Offered to place referral to PT for him today but he states he would like to wait to  see Ortho provider first and for them to refer if they believe it is indicated  Follow up as needed for progressing or persistent symptoms      Relevant Medications   diclofenac (VOLTAREN) 75 MG EC tablet   Other Visit Diagnoses     Dysuria     Acute, new concern Reports having burning with urination and feeling like he is straining to urinate UA results reviewed with patient during exam UA with micro was positive for ketones, protein and mucus - will send for culture to rule out UTI  CT stone study performed on 12/07/21 did not find evidence of kidney stones  Culture results to further dictate management Follow up as needed for persistent or progressing symptoms    Relevant Orders   Urinalysis, Routine w reflex microscopic (Completed)   Urine Culture        No follow-ups on file.   I, Blair Lundeen E Ryn Peine, PA-C, have reviewed all documentation for this visit. The documentation on 12/20/21 for the exam, diagnosis, procedures, and orders are all accurate and complete.   Talitha Givens, MHS, PA-C [facility]

## 2021-12-21 ENCOUNTER — Telehealth: Payer: Self-pay

## 2021-12-21 NOTE — Telephone Encounter (Signed)
Copied from [place] [phone]. Topic: Referral - Status >> Dec 21, 2021 11:27 AM Leilani Able wrote: Reason for CRM: Call pt to advise [phone] pt saw Junie Panning  11/27, pt states was given an ortho referral, do not see attached, pt very upset as is in a lot of pain, out of work with no income and needs appt for Short term Disability, needs a fu call today as very anxious for appt due to financial issues pls fu

## 2021-12-21 NOTE — Telephone Encounter (Signed)
Has referral been sent to orthopedic surgery? Referral placed on 12/14/21.

## 2021-12-21 NOTE — Telephone Encounter (Signed)
Thanks Evelena Peat!

## 2021-12-22 ENCOUNTER — Telehealth: Payer: Self-pay

## 2021-12-22 LAB — URINE CULTURE: Organism ID, Bacteria: NO GROWTH

## 2021-12-22 NOTE — Telephone Encounter (Signed)
Patient called office stating that he needs to have his short-term disability forms filled out immediately because he is currently without income due to being out of work due to his back..  Patient understands that the provider is out of the office, but requests that the forms are filled out  as soon as the provider returns. Please call patient to advise as soon as the forms are completed.

## 2021-12-24 NOTE — Telephone Encounter (Signed)
Forms have been completed by provider, patient has come to the office to complete his portion and get a copy. Faxed to [employer] for patient, fax confirmation received. Patient is aware forms have been faxed.

## 2021-12-28 DIAGNOSIS — M47815 Spondylosis without myelopathy or radiculopathy, thoracolumbar region: Secondary | ICD-10-CM | POA: Diagnosis not present

## 2021-12-28 DIAGNOSIS — M545 Low back pain, unspecified: Secondary | ICD-10-CM | POA: Diagnosis not present

## 2021-12-28 DIAGNOSIS — M4125 Other idiopathic scoliosis, thoracolumbar region: Secondary | ICD-10-CM | POA: Diagnosis not present

## 2021-12-28 DIAGNOSIS — M62830 Muscle spasm of back: Secondary | ICD-10-CM | POA: Diagnosis not present

## 2021-12-29 DIAGNOSIS — S39012A Strain of muscle, fascia and tendon of lower back, initial encounter: Secondary | ICD-10-CM | POA: Diagnosis not present

## 2021-12-29 DIAGNOSIS — M9903 Segmental and somatic dysfunction of lumbar region: Secondary | ICD-10-CM | POA: Diagnosis not present

## 2021-12-29 DIAGNOSIS — M9906 Segmental and somatic dysfunction of lower extremity: Secondary | ICD-10-CM | POA: Diagnosis not present

## 2021-12-29 DIAGNOSIS — M62451 Contracture of muscle, right thigh: Secondary | ICD-10-CM | POA: Diagnosis not present

## 2021-12-29 DIAGNOSIS — M62830 Muscle spasm of back: Secondary | ICD-10-CM | POA: Diagnosis not present

## 2021-12-29 DIAGNOSIS — M9902 Segmental and somatic dysfunction of thoracic region: Secondary | ICD-10-CM | POA: Diagnosis not present

## 2021-12-30 DIAGNOSIS — M9902 Segmental and somatic dysfunction of thoracic region: Secondary | ICD-10-CM | POA: Diagnosis not present

## 2021-12-30 DIAGNOSIS — S39012A Strain of muscle, fascia and tendon of lower back, initial encounter: Secondary | ICD-10-CM | POA: Diagnosis not present

## 2021-12-30 DIAGNOSIS — M62451 Contracture of muscle, right thigh: Secondary | ICD-10-CM | POA: Diagnosis not present

## 2021-12-30 DIAGNOSIS — M62830 Muscle spasm of back: Secondary | ICD-10-CM | POA: Diagnosis not present

## 2021-12-30 DIAGNOSIS — M9903 Segmental and somatic dysfunction of lumbar region: Secondary | ICD-10-CM | POA: Diagnosis not present

## 2021-12-30 DIAGNOSIS — M9906 Segmental and somatic dysfunction of lower extremity: Secondary | ICD-10-CM | POA: Diagnosis not present

## 2021-12-31 DIAGNOSIS — M62451 Contracture of muscle, right thigh: Secondary | ICD-10-CM | POA: Diagnosis not present

## 2021-12-31 DIAGNOSIS — M9903 Segmental and somatic dysfunction of lumbar region: Secondary | ICD-10-CM | POA: Diagnosis not present

## 2021-12-31 DIAGNOSIS — M9902 Segmental and somatic dysfunction of thoracic region: Secondary | ICD-10-CM | POA: Diagnosis not present

## 2021-12-31 DIAGNOSIS — M9906 Segmental and somatic dysfunction of lower extremity: Secondary | ICD-10-CM | POA: Diagnosis not present

## 2021-12-31 DIAGNOSIS — M62830 Muscle spasm of back: Secondary | ICD-10-CM | POA: Diagnosis not present

## 2021-12-31 DIAGNOSIS — S39012A Strain of muscle, fascia and tendon of lower back, initial encounter: Secondary | ICD-10-CM | POA: Diagnosis not present

## 2022-01-04 DIAGNOSIS — S39012A Strain of muscle, fascia and tendon of lower back, initial encounter: Secondary | ICD-10-CM | POA: Diagnosis not present

## 2022-01-04 DIAGNOSIS — M9903 Segmental and somatic dysfunction of lumbar region: Secondary | ICD-10-CM | POA: Diagnosis not present

## 2022-01-04 DIAGNOSIS — M62451 Contracture of muscle, right thigh: Secondary | ICD-10-CM | POA: Diagnosis not present

## 2022-01-04 DIAGNOSIS — M9906 Segmental and somatic dysfunction of lower extremity: Secondary | ICD-10-CM | POA: Diagnosis not present

## 2022-01-04 DIAGNOSIS — M62830 Muscle spasm of back: Secondary | ICD-10-CM | POA: Diagnosis not present

## 2022-01-04 DIAGNOSIS — M9902 Segmental and somatic dysfunction of thoracic region: Secondary | ICD-10-CM | POA: Diagnosis not present

## 2022-01-06 DIAGNOSIS — S39012A Strain of muscle, fascia and tendon of lower back, initial encounter: Secondary | ICD-10-CM | POA: Diagnosis not present

## 2022-01-06 DIAGNOSIS — M9903 Segmental and somatic dysfunction of lumbar region: Secondary | ICD-10-CM | POA: Diagnosis not present

## 2022-01-06 DIAGNOSIS — M62451 Contracture of muscle, right thigh: Secondary | ICD-10-CM | POA: Diagnosis not present

## 2022-01-06 DIAGNOSIS — M62830 Muscle spasm of back: Secondary | ICD-10-CM | POA: Diagnosis not present

## 2022-01-06 DIAGNOSIS — M9906 Segmental and somatic dysfunction of lower extremity: Secondary | ICD-10-CM | POA: Diagnosis not present

## 2022-01-06 DIAGNOSIS — M9902 Segmental and somatic dysfunction of thoracic region: Secondary | ICD-10-CM | POA: Diagnosis not present

## 2022-01-07 DIAGNOSIS — M9906 Segmental and somatic dysfunction of lower extremity: Secondary | ICD-10-CM | POA: Diagnosis not present

## 2022-01-07 DIAGNOSIS — M62830 Muscle spasm of back: Secondary | ICD-10-CM | POA: Diagnosis not present

## 2022-01-07 DIAGNOSIS — M62451 Contracture of muscle, right thigh: Secondary | ICD-10-CM | POA: Diagnosis not present

## 2022-01-07 DIAGNOSIS — M9903 Segmental and somatic dysfunction of lumbar region: Secondary | ICD-10-CM | POA: Diagnosis not present

## 2022-01-07 DIAGNOSIS — S39012A Strain of muscle, fascia and tendon of lower back, initial encounter: Secondary | ICD-10-CM | POA: Diagnosis not present

## 2022-01-07 DIAGNOSIS — M9902 Segmental and somatic dysfunction of thoracic region: Secondary | ICD-10-CM | POA: Diagnosis not present

## 2022-01-11 DIAGNOSIS — M9902 Segmental and somatic dysfunction of thoracic region: Secondary | ICD-10-CM | POA: Diagnosis not present

## 2022-01-11 DIAGNOSIS — M62830 Muscle spasm of back: Secondary | ICD-10-CM | POA: Diagnosis not present

## 2022-01-11 DIAGNOSIS — M62451 Contracture of muscle, right thigh: Secondary | ICD-10-CM | POA: Diagnosis not present

## 2022-01-11 DIAGNOSIS — M9903 Segmental and somatic dysfunction of lumbar region: Secondary | ICD-10-CM | POA: Diagnosis not present

## 2022-01-11 DIAGNOSIS — S39012A Strain of muscle, fascia and tendon of lower back, initial encounter: Secondary | ICD-10-CM | POA: Diagnosis not present

## 2022-01-11 DIAGNOSIS — M9906 Segmental and somatic dysfunction of lower extremity: Secondary | ICD-10-CM | POA: Diagnosis not present

## 2022-01-12 ENCOUNTER — Other Ambulatory Visit: Payer: Self-pay | Admitting: Family Medicine

## 2022-01-12 NOTE — Telephone Encounter (Signed)
Requested Prescriptions  Pending Prescriptions Disp Refills   pantoprazole (PROTONIX) 40 MG tablet [Pharmacy Med Name: Pantoprazole Sodium 40 MG Oral Tablet Delayed Release] 90 tablet 0    Sig: Take 1 tablet by mouth once daily     Gastroenterology: Proton Pump Inhibitors Passed - 01/12/2022  9:39 AM      Passed - Valid encounter within last 12 months    Recent Outpatient Visits           3 weeks ago Acute right-sided low back pain without sciatica   [facility] Mecum, Erin E, PA-C   1 month ago Acute right-sided low back pain without sciatica   [employer], Erin E, PA-C   6 months ago Acute non-recurrent frontal sinusitis   [place], NP   8 months ago Routine general medical examination at a health care facility   [facility], [place], DO   1 year ago Proteinuria, unspecified type   [facility], Scheryl Darter, NP

## 2022-01-13 DIAGNOSIS — M62830 Muscle spasm of back: Secondary | ICD-10-CM | POA: Diagnosis not present

## 2022-01-13 DIAGNOSIS — M9903 Segmental and somatic dysfunction of lumbar region: Secondary | ICD-10-CM | POA: Diagnosis not present

## 2022-01-13 DIAGNOSIS — M62451 Contracture of muscle, right thigh: Secondary | ICD-10-CM | POA: Diagnosis not present

## 2022-01-13 DIAGNOSIS — S39012A Strain of muscle, fascia and tendon of lower back, initial encounter: Secondary | ICD-10-CM | POA: Diagnosis not present

## 2022-01-13 DIAGNOSIS — M9902 Segmental and somatic dysfunction of thoracic region: Secondary | ICD-10-CM | POA: Diagnosis not present

## 2022-01-13 DIAGNOSIS — M9906 Segmental and somatic dysfunction of lower extremity: Secondary | ICD-10-CM | POA: Diagnosis not present

## 2022-01-14 DIAGNOSIS — M62451 Contracture of muscle, right thigh: Secondary | ICD-10-CM | POA: Diagnosis not present

## 2022-01-14 DIAGNOSIS — S39012A Strain of muscle, fascia and tendon of lower back, initial encounter: Secondary | ICD-10-CM | POA: Diagnosis not present

## 2022-01-14 DIAGNOSIS — M9906 Segmental and somatic dysfunction of lower extremity: Secondary | ICD-10-CM | POA: Diagnosis not present

## 2022-01-14 DIAGNOSIS — M9902 Segmental and somatic dysfunction of thoracic region: Secondary | ICD-10-CM | POA: Diagnosis not present

## 2022-01-14 DIAGNOSIS — M62830 Muscle spasm of back: Secondary | ICD-10-CM | POA: Diagnosis not present

## 2022-01-14 DIAGNOSIS — M9903 Segmental and somatic dysfunction of lumbar region: Secondary | ICD-10-CM | POA: Diagnosis not present

## 2022-01-20 DIAGNOSIS — M9906 Segmental and somatic dysfunction of lower extremity: Secondary | ICD-10-CM | POA: Diagnosis not present

## 2022-01-20 DIAGNOSIS — M62830 Muscle spasm of back: Secondary | ICD-10-CM | POA: Diagnosis not present

## 2022-01-20 DIAGNOSIS — M9902 Segmental and somatic dysfunction of thoracic region: Secondary | ICD-10-CM | POA: Diagnosis not present

## 2022-01-20 DIAGNOSIS — S39012A Strain of muscle, fascia and tendon of lower back, initial encounter: Secondary | ICD-10-CM | POA: Diagnosis not present

## 2022-01-20 DIAGNOSIS — M9903 Segmental and somatic dysfunction of lumbar region: Secondary | ICD-10-CM | POA: Diagnosis not present

## 2022-01-20 DIAGNOSIS — M62451 Contracture of muscle, right thigh: Secondary | ICD-10-CM | POA: Diagnosis not present

## 2022-02-07 DIAGNOSIS — M9906 Segmental and somatic dysfunction of lower extremity: Secondary | ICD-10-CM | POA: Diagnosis not present

## 2022-02-07 DIAGNOSIS — S39012A Strain of muscle, fascia and tendon of lower back, initial encounter: Secondary | ICD-10-CM | POA: Diagnosis not present

## 2022-02-07 DIAGNOSIS — M9902 Segmental and somatic dysfunction of thoracic region: Secondary | ICD-10-CM | POA: Diagnosis not present

## 2022-02-07 DIAGNOSIS — M62451 Contracture of muscle, right thigh: Secondary | ICD-10-CM | POA: Diagnosis not present

## 2022-02-07 DIAGNOSIS — M9903 Segmental and somatic dysfunction of lumbar region: Secondary | ICD-10-CM | POA: Diagnosis not present

## 2022-02-07 DIAGNOSIS — M62830 Muscle spasm of back: Secondary | ICD-10-CM | POA: Diagnosis not present

## 2022-03-07 DIAGNOSIS — M9902 Segmental and somatic dysfunction of thoracic region: Secondary | ICD-10-CM | POA: Diagnosis not present

## 2022-03-07 DIAGNOSIS — M62451 Contracture of muscle, right thigh: Secondary | ICD-10-CM | POA: Diagnosis not present

## 2022-03-07 DIAGNOSIS — M9906 Segmental and somatic dysfunction of lower extremity: Secondary | ICD-10-CM | POA: Diagnosis not present

## 2022-03-07 DIAGNOSIS — S39012A Strain of muscle, fascia and tendon of lower back, initial encounter: Secondary | ICD-10-CM | POA: Diagnosis not present

## 2022-03-07 DIAGNOSIS — M62830 Muscle spasm of back: Secondary | ICD-10-CM | POA: Diagnosis not present

## 2022-03-07 DIAGNOSIS — M9903 Segmental and somatic dysfunction of lumbar region: Secondary | ICD-10-CM | POA: Diagnosis not present

## 2022-03-14 ENCOUNTER — Other Ambulatory Visit: Payer: Self-pay | Admitting: Family Medicine

## 2022-03-14 DIAGNOSIS — I10 Essential (primary) hypertension: Secondary | ICD-10-CM

## 2022-03-15 NOTE — Telephone Encounter (Signed)
Requested Prescriptions  Pending Prescriptions Disp Refills   benazepril (LOTENSIN) 40 MG tablet [Pharmacy Med Name: Benazepril HCl 40 MG Oral Tablet] 90 tablet 0    Sig: Take 1 tablet by mouth once daily     Cardiovascular:  ACE Inhibitors Failed - 03/14/2022  2:23 PM      Failed - K in normal range and within 180 days    Potassium  Date Value Ref Range Status  12/07/2021 3.3 (L) 3.5 - 5.1 mmol/L Final  01/11/2013 3.7 3.5 - 5.1 mmol/L Final         Passed - Cr in normal range and within 180 days    Creatinine  Date Value Ref Range Status  01/11/2013 1.04 0.60 - 1.30 mg/dL Final   Creatinine, Ser  Date Value Ref Range Status  12/07/2021 1.14 0.61 - 1.24 mg/dL Final         Passed - Patient is not pregnant      Passed - Last BP in normal range    BP Readings from Last 1 Encounters:  12/20/21 (!) 94/56         Passed - Valid encounter within last 6 months    Recent Outpatient Visits           2 months ago Acute right-sided low back pain without sciatica   [facility] Mecum, Dani Gobble, PA-C   3 months ago Acute right-sided low back pain without sciatica   [place] [facility] Mecum, Dani Gobble, PA-C   8 months ago Acute non-recurrent frontal sinusitis   [place] Jon Billings, NP   10 months ago Routine general medical examination at a health care facility   [facility], Megan P, DO   1 year ago Proteinuria, unspecified type   [place], Lauren A, NP               hydrochlorothiazide (HYDRODIURIL) 25 MG tablet [Pharmacy Med Name: hydroCHLOROthiazide 25 MG Oral Tablet] 90 tablet 0    Sig: Take 1 tablet by mouth once daily     Cardiovascular: Diuretics - Thiazide Failed - 03/14/2022  2:23 PM      Failed - K in normal range and within 180 days    Potassium  Date Value Ref Range Status  12/07/2021 3.3 (L) 3.5 - 5.1 mmol/L Final   01/11/2013 3.7 3.5 - 5.1 mmol/L Final         Passed - Cr in normal range and within 180 days    Creatinine  Date Value Ref Range Status  01/11/2013 1.04 0.60 - 1.30 mg/dL Final   Creatinine, Ser  Date Value Ref Range Status  12/07/2021 1.14 0.61 - 1.24 mg/dL Final         Passed - Na in normal range and within 180 days    Sodium  Date Value Ref Range Status  12/07/2021 139 135 - 145 mmol/L Final  05/10/2021 139 134 - 144 mmol/L Final  01/11/2013 137 136 - 145 mmol/L Final         Passed - Last BP in normal range    BP Readings from Last 1 Encounters:  12/20/21 (!) 94/56         Passed - Valid encounter within last 6 months    Recent Outpatient Visits           2 months ago Acute right-sided low back pain without sciatica  [place], PA-C   3 months ago Acute right-sided low back pain without sciatica   [place] [facility] Mecum, Dani Gobble, PA-C   8 months ago Acute non-recurrent frontal sinusitis   [place], NP   10 months ago Routine general medical examination at a health care facility   [place], DO   1 year ago Proteinuria, unspecified type   [place] Charyl Dancer, NP

## 2022-04-12 ENCOUNTER — Other Ambulatory Visit: Payer: Self-pay | Admitting: Family Medicine

## 2022-04-13 NOTE — Telephone Encounter (Signed)
Requested Prescriptions  Pending Prescriptions Disp Refills   pantoprazole (PROTONIX) 40 MG tablet [Pharmacy Med Name: Pantoprazole Sodium 40 MG Oral Tablet Delayed Release] 90 tablet 1    Sig: Take 1 tablet by mouth once daily     Gastroenterology: Proton Pump Inhibitors Passed - 04/12/2022  5:36 PM      Passed - Valid encounter within last 12 months    Recent Outpatient Visits           3 months ago Acute right-sided low back pain without sciatica   [facility] Mecum, Erin E, PA-C   4 months ago Acute right-sided low back pain without sciatica   [facility] Mecum, Erin E, PA-C   9 months ago Acute non-recurrent frontal sinusitis   [place], NP   11 months ago Routine general medical examination at a health care facility   [place], DO   1 year ago Proteinuria, unspecified type   [place] Charyl Dancer, NP

## 2022-04-18 ENCOUNTER — Ambulatory Visit: Payer: BC Managed Care – PPO | Admitting: Family Medicine

## 2022-04-18 ENCOUNTER — Encounter: Payer: Self-pay | Admitting: Family Medicine

## 2022-04-18 VITALS — BP 106/57 | HR 74 | Temp 99.3°F | Wt 164.0 lb

## 2022-04-18 DIAGNOSIS — J069 Acute upper respiratory infection, unspecified: Secondary | ICD-10-CM | POA: Diagnosis not present

## 2022-04-18 DIAGNOSIS — R051 Acute cough: Secondary | ICD-10-CM | POA: Diagnosis not present

## 2022-04-18 MED ORDER — BENZONATATE 100 MG PO CAPS: 100.0000 mg | ORAL_CAPSULE | Freq: Two times a day (BID) | ORAL | 0 refills | Status: DC | PRN

## 2022-04-18 NOTE — Progress Notes (Signed)
BP (!) 106/57   Pulse 74   Temp 99.3 F (37.4 C) (Oral)   Wt 164 lb (74.4 kg)   SpO2 98%   BMI 25.69 kg/m    Subjective:    Patient ID: Shaun Carney, male    DOB: 09-24-1960, 62 y.o.   MRN: [id number]  HPI: Shaun Carney is a 62 y.o. male  Chief Complaint  Patient presents with   URI    Pt states he has been having a cough, congestion, sore throat, chills, and body aches since Friday.    UPPER RESPIRATORY TRACT INFECTION Started Friday, now has been dealing with this for 3 days.  Worst symptom: Chest soreness related to coughing Fever: yes Cough: yes; wet cough Shortness of breath: yes with cough Wheezing: no Chest pain: yes, with cough Chest tightness: no Chest congestion: no Nasal congestion: yes Runny nose: no Post nasal drip: yes Sneezing: no Sore throat: yes Swollen glands: no Sinus pressure: no Headache: no Face pain: no Toothache: no Ear pain: no  Ear pressure: no  Eyes red/itching:no Eye drainage/crusting: no  Vomiting: no Rash: no Fatigue: no Sick contacts:  No Strep contacts: no  Context: better Recurrent sinusitis: no Relief with OTC cold/cough medications: yes; Nyquil and Theraflu for the last 3 days Treatments attempted: anti-histamine helped with congestion but did not go away completely. Taking x2 day for 3 days.   Relevant past medical, surgical, family and social history reviewed and updated as indicated. Interim medical history since our last visit reviewed. Allergies and medications reviewed and updated.  Review of Systems  Constitutional:  Negative for chills, fatigue and fever.  HENT:  Positive for congestion, postnasal drip and sore throat. Negative for ear pain, rhinorrhea, sinus pressure, sinus pain and sneezing.   Eyes:  Negative for discharge, redness and itching.  Respiratory:  Positive for cough and shortness of breath. Negative for chest tightness and wheezing.   Cardiovascular:  Negative for chest pain.       Chest  tenderness from cough  Gastrointestinal:  Negative for vomiting.  Skin:  Negative for rash.  Neurological:  Negative for headaches.    Per HPI unless specifically indicated above     Objective:    BP (!) 106/57   Pulse 74   Temp 99.3 F (37.4 C) (Oral)   Wt 164 lb (74.4 kg)   SpO2 98%   BMI 25.69 kg/m   Wt Readings from Last 3 Encounters:  04/18/22 164 lb (74.4 kg)  12/20/21 162 lb 12.8 oz (73.8 kg)  12/07/21 165 lb (74.8 kg)    Physical Exam Vitals and nursing note reviewed.  Constitutional:      General: He is not in acute distress.    Appearance: Normal appearance. He is not ill-appearing, toxic-appearing or diaphoretic.  HENT:     Head: Normocephalic and atraumatic.     Right Ear: Tympanic membrane, ear canal and external ear normal. There is no impacted cerumen.     Left Ear: Tympanic membrane, ear canal and external ear normal. There is no impacted cerumen.     Nose: Nasal tenderness and congestion present. No rhinorrhea.     Mouth/Throat:     Mouth: Mucous membranes are moist.     Pharynx: Oropharynx is clear. Posterior oropharyngeal erythema present. No oropharyngeal exudate.  Eyes:     General: No scleral icterus.       Right eye: No discharge.        Left eye: No discharge.  Extraocular Movements: Extraocular movements intact.     Conjunctiva/sclera: Conjunctivae normal.     Pupils: Pupils are equal, round, and reactive to light.  Neck:     Vascular: No carotid bruit.  Cardiovascular:     Rate and Rhythm: Normal rate and regular rhythm.     Pulses: Normal pulses.     Heart sounds: Normal heart sounds. No murmur heard.    No friction rub. No gallop.  Pulmonary:     Effort: Pulmonary effort is normal. No respiratory distress.     Breath sounds: Normal breath sounds. No stridor. No wheezing, rhonchi or rales.  Chest:     Chest wall: No tenderness.  Musculoskeletal:        General: Normal range of motion.     Cervical back: Normal range of motion and  neck supple. No rigidity. No muscular tenderness.  Skin:    General: Skin is warm and dry.     Capillary Refill: Capillary refill takes less than 2 seconds.     Coloration: Skin is not jaundiced or pale.     Findings: No bruising, erythema, lesion or rash.  Neurological:     General: No focal deficit present.     Mental Status: He is alert and oriented to person, place, and time. Mental status is at baseline.     Cranial Nerves: No cranial nerve deficit.     Sensory: No sensory deficit.     Motor: No weakness.     Coordination: Coordination normal.     Gait: Gait normal.     Deep Tendon Reflexes: Reflexes normal.  Psychiatric:        Mood and Affect: Mood normal.        Behavior: Behavior normal.        Thought Content: Thought content normal.        Judgment: Judgment normal.     Results for orders placed or performed in visit on 04/18/22  Rapid Strep screen([facility])   Specimen: Other   Other  Result Value Ref Range   Strep Gp A Ag, IA W/Reflex Negative Negative  Culture, Group A Strep   Other  Result Value Ref Range   Strep A Culture WILL FOLLOW   Veritor Flu A/B Waived  Result Value Ref Range   Influenza A Negative Negative   Influenza B Negative Negative      Assessment & Plan:   Problem List Items Addressed This Visit   None Visit Diagnoses     Upper respiratory tract infection, unspecified type    -  Primary   Recommend Coricidin HBP PRN for symptom management, Claritin for congestion, and Flonase for nasal relief. Tessalon prescribed PRN for cough. Negative flu/strep   Relevant Orders   Veritor Flu A/B Waived (Completed)   Novel Coronavirus, NAA ([facility])   Rapid Strep screen([facility]) (Completed)   Acute cough       Relevant Medications   benzonatate (TESSALON) 100 MG capsule        Follow up plan: Return After 4/17 for physical.

## 2022-04-18 NOTE — Patient Instructions (Signed)
Take Claritin Q12 hours Take Coricidin HBP as needed Continue taking Flonase

## 2022-04-19 LAB — NOVEL CORONAVIRUS, NAA: SARS-CoV-2, NAA: NOT DETECTED

## 2022-04-21 LAB — VERITOR FLU A/B WAIVED
Influenza A: NEGATIVE
Influenza B: NEGATIVE

## 2022-04-21 LAB — RAPID STREP SCREEN (MED CTR MEBANE ONLY): Strep Gp A Ag, IA W/Reflex: NEGATIVE

## 2022-04-21 LAB — CULTURE, GROUP A STREP: Strep A Culture: NEGATIVE

## 2022-04-27 ENCOUNTER — Encounter: Payer: Self-pay | Admitting: Family Medicine

## 2022-04-27 ENCOUNTER — Ambulatory Visit: Payer: BC Managed Care – PPO | Admitting: Family Medicine

## 2022-04-27 VITALS — BP 109/64 | HR 87 | Temp 98.7°F | Ht 67.0 in | Wt 166.7 lb

## 2022-04-27 DIAGNOSIS — J0100 Acute maxillary sinusitis, unspecified: Secondary | ICD-10-CM | POA: Diagnosis not present

## 2022-04-27 MED ORDER — PREDNISONE 50 MG PO TABS: 50.0000 mg | ORAL_TABLET | Freq: Every day | ORAL | 0 refills | Status: DC

## 2022-04-27 MED ORDER — AMOXICILLIN-POT CLAVULANATE 875-125 MG PO TABS: 1.0000 | ORAL_TABLET | Freq: Two times a day (BID) | ORAL | 0 refills | Status: DC

## 2022-04-27 NOTE — Progress Notes (Signed)
BP 109/64   Pulse 87   Temp 98.7 F (37.1 C) (Oral)   Ht 5\' 7"  (1.702 m)   Wt 166 lb 11.2 oz (75.6 kg)   SpO2 96%   BMI 26.11 kg/m    Subjective:    Patient ID: Shaun Carney, male    DOB: 12-05-1960, 62 y.o.   MRN: [id number]  HPI: Shaun Carney is a 62 y.o. male  Chief Complaint  Patient presents with   Cough    Patient says he was seen last week by Francesca Jewett, NP and says he was prescribed a cough syrup and has almost completed it and says he is still coughing and seem to not be able to get up all the phlegm.    UPPER RESPIRATORY TRACT INFECTION Duration: about 10 days Worst symptom: cough Fever: yes Cough: yes Shortness of breath: yes Wheezing: yes Chest pain: no Chest tightness: no Chest congestion: no Nasal congestion: yes Runny nose: yes Post nasal drip: no Sneezing: no Sore throat: no Swollen glands: no Sinus pressure: no Headache: no Face pain: no Toothache: no Ear pain: no  Ear pressure: no  Eyes red/itching:no Eye drainage/crusting: no  Vomiting: no Rash: no Fatigue: yes Sick contacts: no Strep contacts: no  Context: worse Recurrent sinusitis: no Relief with OTC cold/cough medications: no  Treatments attempted: flonase, cough medicine, claritin   Relevant past medical, surgical, family and social history reviewed and updated as indicated. Interim medical history since our last visit reviewed. Allergies and medications reviewed and updated.  Review of Systems  Constitutional:  Positive for chills, fatigue and fever. Negative for activity change, appetite change, diaphoresis and unexpected weight change.  HENT:  Positive for congestion, postnasal drip and sinus pressure. Negative for dental problem, drooling, ear discharge, ear pain, facial swelling, hearing loss, mouth sores, nosebleeds, rhinorrhea, sinus pain, sneezing, sore throat, tinnitus, trouble swallowing and voice change.   Respiratory:  Positive for cough. Negative for apnea,  choking, chest tightness, shortness of breath, wheezing and stridor.   Cardiovascular: Negative.   Musculoskeletal:  Positive for arthralgias. Negative for back pain, gait problem, joint swelling, myalgias, neck pain and neck stiffness.  Skin: Negative.   Psychiatric/Behavioral: Negative.      Per HPI unless specifically indicated above     Objective:    BP 109/64   Pulse 87   Temp 98.7 F (37.1 C) (Oral)   Ht 5\' 7"  (1.702 m)   Wt 166 lb 11.2 oz (75.6 kg)   SpO2 96%   BMI 26.11 kg/m   Wt Readings from Last 3 Encounters:  04/27/22 166 lb 11.2 oz (75.6 kg)  04/18/22 164 lb (74.4 kg)  12/20/21 162 lb 12.8 oz (73.8 kg)    Physical Exam Vitals and nursing note reviewed.  Constitutional:      General: He is not in acute distress.    Appearance: Normal appearance. He is not ill-appearing, toxic-appearing or diaphoretic.  HENT:     Head: Normocephalic and atraumatic.     Right Ear: Tympanic membrane, ear canal and external ear normal.     Left Ear: Tympanic membrane, ear canal and external ear normal.     Nose: Congestion and rhinorrhea present.     Mouth/Throat:     Mouth: Mucous membranes are moist.     Pharynx: Oropharynx is clear. Posterior oropharyngeal erythema present. No oropharyngeal exudate.  Eyes:     General: No scleral icterus.       Right eye: No discharge.  Left eye: No discharge.     Extraocular Movements: Extraocular movements intact.     Conjunctiva/sclera: Conjunctivae normal.     Pupils: Pupils are equal, round, and reactive to light.  Cardiovascular:     Rate and Rhythm: Normal rate and regular rhythm.     Pulses: Normal pulses.     Heart sounds: Normal heart sounds. No murmur heard.    No friction rub. No gallop.  Pulmonary:     Effort: Pulmonary effort is normal. No respiratory distress.     Breath sounds: Normal breath sounds. No stridor. No wheezing, rhonchi or rales.  Chest:     Chest wall: No tenderness.  Musculoskeletal:         General: Normal range of motion.     Cervical back: Normal range of motion and neck supple.  Skin:    General: Skin is warm and dry.     Capillary Refill: Capillary refill takes less than 2 seconds.     Coloration: Skin is not jaundiced or pale.     Findings: No bruising, erythema, lesion or rash.  Neurological:     General: No focal deficit present.     Mental Status: He is alert and oriented to person, place, and time. Mental status is at baseline.  Psychiatric:        Mood and Affect: Mood normal.        Behavior: Behavior normal.        Thought Content: Thought content normal.        Judgment: Judgment normal.     Results for orders placed or performed in visit on 04/18/22  Novel Coronavirus, NAA ([facility])   Specimen: Nasopharyngeal(NP) swabs in vial transport medium  Result Value Ref Range   SARS-CoV-2, NAA Not Detected Not Detected  Rapid Strep screen([facility])   Specimen: Other   Other  Result Value Ref Range   Strep Gp A Ag, IA W/Reflex Negative Negative  Culture, Group A Strep   Other  Result Value Ref Range   Strep A Culture Negative   Veritor Flu A/B Waived  Result Value Ref Range   Influenza A Negative Negative   Influenza B Negative Negative      Assessment & Plan:   Problem List Items Addressed This Visit   None Visit Diagnoses     Acute non-recurrent maxillary sinusitis    -  Primary   Will treat with prednisone and augmentin. Call if not getting better or getting worse.   Relevant Medications   predniSONE (DELTASONE) 50 MG tablet   amoxicillin-clavulanate (AUGMENTIN) 875-125 MG tablet        Follow up plan: Return in about 2 weeks (around 05/11/2022) for physical.

## 2022-04-28 ENCOUNTER — Ambulatory Visit: Payer: Self-pay

## 2022-04-28 NOTE — Telephone Encounter (Signed)
Called the pt and he stated that he is not able to do 9:20 or the 2:20 due to being at work.  He stated that he went to the bathroom and it was clear, but if something should occur (blood in the urine) tomorrow he will go to UC to be seen but if we have a way to see him at 3:30 - 4:30 please give him a call and he would be able to come in.

## 2022-04-28 NOTE — Telephone Encounter (Signed)
I can see him at 4:20 tomorrow

## 2022-04-28 NOTE — Telephone Encounter (Signed)
I can see him tomorrow with a virtual double book

## 2022-04-28 NOTE — Telephone Encounter (Signed)
  Chief Complaint: Blood in urine pink urine Symptoms: above Frequency: today 2x Pertinent Negatives: Patient denies fever, pain Disposition: [] ED /[x] Urgent Care (no appt availability in office) / [] Appointment(In office/virtual)/ []  [place] [facility]/ [] Home Care/ [] Refused Recommended Disposition /[] [place] [facility]/ []  Follow-up with PCP Additional Notes: Pt states that he has had blood in his urine 2 times today.  No other s/s. No appts availble. Sent trams message to [employer] - no response.    Called pt back - Pt advised to go to UC.  Summary: blood in urine   Pt states that he is having blood in his urine. Symptoms just started today. Please advise.     Reason for Disposition  [1] Pink or red-colored urine and likely from food (beets, rhubarb, red food dye) AND [2] lasts > 24 hours after stopping food  Answer Assessment - Initial Assessment Questions 1. COLOR of URINE: "Describe the color of the urine."  (e.g., tea-colored, pink, red, bloody) "Do you have blood clots in your urine?" (e.g., none, pea, grape, small coin)     Small amount 2. ONSET: "When did the bleeding start?"      Today 3. EPISODES: "How many times has there been blood in the urine?" or "How many times today?"     2X 4. PAIN with URINATION: "Is there any pain with passing your urine?" If Yes, ask: "How bad is the pain?"  (Scale 1-10; or mild, moderate, severe)    - MILD: Complains slightly about urination hurting.    - MODERATE: Interferes with normal activities.      - SEVERE: Excruciating, unwilling or unable to urinate because of the pain.      no 5. FEVER: "Do you have a fever?" If Yes, ask: "What is your temperature, how was it measured, and when did it start?"     no 6. ASSOCIATED SYMPTOMS: "Are you passing urine more frequently than usual?"     no 7. OTHER SYMPTOMS: "Do you have any other symptoms?" (e.g., back/flank pain, abdomen pain, vomiting)     no  Protocols used: Urine -  Blood In-A-AH

## 2022-04-29 ENCOUNTER — Ambulatory Visit: Payer: BC Managed Care – PPO | Admitting: Family Medicine

## 2022-04-29 ENCOUNTER — Encounter: Payer: Self-pay | Admitting: Family Medicine

## 2022-04-29 VITALS — BP 125/71 | HR 73 | Temp 97.9°F | Wt 166.0 lb

## 2022-04-29 DIAGNOSIS — R310 Gross hematuria: Secondary | ICD-10-CM | POA: Diagnosis not present

## 2022-04-29 MED ORDER — CIPROFLOXACIN HCL 500 MG PO TABS: 500.0000 mg | ORAL_TABLET | Freq: Two times a day (BID) | ORAL | 0 refills | Status: DC

## 2022-04-29 NOTE — Telephone Encounter (Signed)
Pt was called and scheduled for today at 4:20 per provider request.

## 2022-04-30 ENCOUNTER — Encounter: Payer: Self-pay | Admitting: Family Medicine

## 2022-04-30 LAB — URINALYSIS, ROUTINE W REFLEX MICROSCOPIC
Bilirubin, UA: NEGATIVE
Glucose, UA: NEGATIVE
Ketones, UA: NEGATIVE
Leukocytes,UA: NEGATIVE
Nitrite, UA: NEGATIVE
RBC, UA: NEGATIVE
Specific Gravity, UA: >=1.03 — AB (ref 1.005–1.030)
Urobilinogen, Ur: 1 mg/dL (ref 0.2–1.0)
pH, UA: 5.5 (ref 5.0–7.5)

## 2022-04-30 LAB — MICROSCOPIC EXAMINATION
Bacteria, UA: NONE SEEN
Casts: NONE SEEN /lpf
Epithelial Cells (non renal): NONE SEEN /hpf (ref 0–10)
RBC, Urine: NONE SEEN /hpf (ref 0–2)
WBC, UA: NONE SEEN /hpf (ref 0–5)

## 2022-04-30 NOTE — Progress Notes (Signed)
BP 125/71   Pulse 73   Temp 97.9 F (36.6 C) (Oral)   Wt 166 lb (75.3 kg)   SpO2 97%   BMI 26.00 kg/m    Subjective:    Patient ID: Shaun Carney, male    DOB: 29-Nov-1960, 62 y.o.   MRN: [id number]  HPI: Shaun Carney is a 63 y.o. male  Chief Complaint  Patient presents with   Hematuria    Patient says he has been drinking a lot of sodas lately and not sure if that is related. Patient says he does not even know if what he saw was blood in his urine. Patient says his urine was really dark. Patient says his urine has cleared up since yesterday.    URINARY SYMPTOMS Duration: yesterday Dysuria: no Urinary frequency: no Urgency: no Small volume voids: no Symptom severity: no Urinary incontinence: no Foul odor: no Hematuria: yes Abdominal pain: no Back pain: yes Suprapubic pain/pressure: no Flank pain: no Fever:  no Vomiting: no Relief with cranberry juice: N/A Relief with pyridium: N/A Status: better Previous urinary tract infection: no Recurrent urinary tract infection: no Penile discharge: no Treatments attempted: increasing fluids   Relevant past medical, surgical, family and social history reviewed and updated as indicated. Interim medical history since our last visit reviewed. Allergies and medications reviewed and updated.  Review of Systems  Constitutional: Negative.   Respiratory: Negative.    Cardiovascular: Negative.   Gastrointestinal: Negative.   Genitourinary:  Positive for hematuria. Negative for decreased urine volume, difficulty urinating, dysuria, enuresis, flank pain, frequency, genital sores, penile discharge, penile pain, penile swelling, scrotal swelling, testicular pain and urgency.  Musculoskeletal: Negative.   Psychiatric/Behavioral: Negative.      Per HPI unless specifically indicated above     Objective:    BP 125/71   Pulse 73   Temp 97.9 F (36.6 C) (Oral)   Wt 166 lb (75.3 kg)   SpO2 97%   BMI 26.00 kg/m   Wt Readings from  Last 3 Encounters:  04/29/22 166 lb (75.3 kg)  04/27/22 166 lb 11.2 oz (75.6 kg)  04/18/22 164 lb (74.4 kg)    Physical Exam Vitals and nursing note reviewed.  Constitutional:      General: He is not in acute distress.    Appearance: Normal appearance. He is not ill-appearing, toxic-appearing or diaphoretic.  HENT:     Head: Normocephalic and atraumatic.     Right Ear: External ear normal.     Left Ear: External ear normal.     Nose: Nose normal.     Mouth/Throat:     Mouth: Mucous membranes are moist.     Pharynx: Oropharynx is clear.  Eyes:     General: No scleral icterus.       Right eye: No discharge.        Left eye: No discharge.     Extraocular Movements: Extraocular movements intact.     Conjunctiva/sclera: Conjunctivae normal.     Pupils: Pupils are equal, round, and reactive to light.  Cardiovascular:     Rate and Rhythm: Normal rate and regular rhythm.     Pulses: Normal pulses.     Heart sounds: Normal heart sounds. No murmur heard.    No friction rub. No gallop.  Pulmonary:     Effort: Pulmonary effort is normal. No respiratory distress.     Breath sounds: Normal breath sounds. No stridor. No wheezing, rhonchi or rales.  Chest:     Chest wall:  No tenderness.  Abdominal:     General: Abdomen is flat. Bowel sounds are normal. There is no distension.     Palpations: Abdomen is soft. There is no mass.     Tenderness: There is no abdominal tenderness. There is no right CVA tenderness, left CVA tenderness, guarding or rebound.     Hernia: No hernia is present.  Musculoskeletal:        General: Normal range of motion.     Cervical back: Normal range of motion and neck supple.  Skin:    General: Skin is warm and dry.     Capillary Refill: Capillary refill takes less than 2 seconds.     Coloration: Skin is not jaundiced or pale.     Findings: No bruising, erythema, lesion or rash.  Neurological:     General: No focal deficit present.     Mental Status: He is  alert and oriented to person, place, and time. Mental status is at baseline.  Psychiatric:        Mood and Affect: Mood normal.        Behavior: Behavior normal.        Thought Content: Thought content normal.        Judgment: Judgment normal.     Results for orders placed or performed in visit on 04/29/22  Microscopic Examination  Result Value Ref Range   WBC, UA None seen 0 - 5 /hpf   RBC, Urine None seen 0 - 2 /hpf   Epithelial Cells (non renal) None seen 0 - 10 /hpf   Casts None seen None seen /lpf   Bacteria, UA None seen None seen/Few  Urinalysis, Routine w reflex microscopic  Result Value Ref Range   Specific Gravity, UA      >=1.030 (A) 1.005 - 1.030   pH, UA 5.5 5.0 - 7.5   Color, UA Yellow Yellow   Appearance Ur Clear Clear   Leukocytes,UA Negative Negative   Protein,UA 1+ (A) Negative/Trace   Glucose, UA Negative Negative   Ketones, UA Negative Negative   RBC, UA Negative Negative   Bilirubin, UA Negative Negative   Urobilinogen, Ur 1.0 0.2 - 1.0 mg/dL   Nitrite, UA Negative Negative   Microscopic Examination See below:       Assessment & Plan:   Problem List Items Addressed This Visit   None Visit Diagnoses     Gross hematuria    -  Primary   Will treat for ?prostatitis. No pain. If not getting better or getting worse will call and we will arrange CT to r/o kidney stone. Call with concerns.   Relevant Orders   Urine Culture   Urinalysis, Routine w reflex microscopic (Completed)        Follow up plan: Return if symptoms worsen or fail to improve.

## 2022-05-02 LAB — URINE CULTURE: Organism ID, Bacteria: NO GROWTH

## 2022-05-12 ENCOUNTER — Ambulatory Visit: Payer: Self-pay | Admitting: *Deleted

## 2022-05-12 ENCOUNTER — Telehealth: Payer: Self-pay | Admitting: Family Medicine

## 2022-05-12 NOTE — Telephone Encounter (Signed)
The patient called in stating the medicine he has been on has not helped him at all. His symptoms still persist. He is waking up 3 times a night to go to the bathroom. He has been on ciprofloxacin (CIPRO) 500 MG tablet , and amoxicillin-clavulanate (AUGMENTIN) 875-125 MG tablet . He states his provider told him to call the office if he is still having problems. He was offered an appt tomorrow morning with [place] Gum which he declined stating he cannot make it as his work won't excuse it. Please assist patient further as he doesn't know what else to do. He uses   [id number] [address] [place] - [address] Phone: [phone]  Fax: [phone]

## 2022-05-12 NOTE — Telephone Encounter (Signed)
  Chief Complaint: urinary frequency Symptoms: frequency, feels like he has to go after again after emptying bladder Frequency: new symptom Pertinent Negatives: Patient denies fever, pain, blood in urine improved Disposition: ED /[] Urgent Care (no appt availability in office) / Appointment(In office/virtual)/  [place] [facility]/ Home Care/ Refused Recommended Disposition /[] [place] [facility]/  Follow-up with PCP Additional Notes: Patient would like to come to office tomorrow after 4- he is working and needs a late appointment. Advised I would send request- no available appointment on schedule.

## 2022-05-12 NOTE — Telephone Encounter (Signed)
Summary: Problems with medicine, seeking advice   Pt called and reports that the medication he is taking is not working, he thinks something is wrong with his kidneys because he is urinating more than he typically would. He is seeking an appt tomorrow at 4.     Reason for Disposition  Urinating more frequently than usual (i.e., frequency)  Answer Assessment - Initial Assessment Questions 1. SYMPTOM: "What's the main symptom you're concerned about?" (e.g., frequency, incontinence)     Urinary frequency 2. ONSET: "When did the  frequency  start?"     Started with UTI 3. PAIN: "Is there any pain?" If Yes, ask: "How bad is it?" (Scale: 1-10; mild, moderate, severe)     no 4. CAUSE: "What do you think is causing the symptoms?"     UTI- under treatment- patient is still taking 5. OTHER SYMPTOMS: "Do you have any other symptoms?" (e.g., blood in urine, fever, flank pain, pain with urination)     no  Protocols used: Urinary Symptoms-A-AH

## 2022-05-12 NOTE — Telephone Encounter (Signed)
Please call and schedule the patient an appointment.  

## 2022-05-12 NOTE — Telephone Encounter (Signed)
Called patient to schedule an appt left detailed message on machine  for patient to call can schedule am appt with provider

## 2022-05-12 NOTE — Telephone Encounter (Signed)
See Nurse Triage encounter for 05/12/22.

## 2022-05-12 NOTE — Telephone Encounter (Signed)
Patient called in and the below was documented in another encounter. I called the patient and he says he can only come in after 4p due to his job and he will get written up if he's out again. I advised there are no openings that I can schedule after 4p, so I will send this to provider so that she can review and someone will call with her recommendation. He says he will continue taking the medications.   Mariel Kansky P20 minutes ago (4:56 PM)   JS The patient called in stating the medicine he has been on has not helped him at all. His symptoms still persist. He is waking up 3 times a night to go to the bathroom. He has been on ciprofloxacin (CIPRO) 500 MG tablet , and amoxicillin-clavulanate (AUGMENTIN) 875-125 MG tablet . He states his provider told him to call the office if he is still having problems. He was offered an appt tomorrow morning with [place] Gum which he declined stating he cannot make it as his work won't excuse it. Please assist patient further as he doesn't know what else to do. He uses     [facility] [place] [address]),  - [address] Phone: [phone]  Fax: [phone]

## 2022-05-13 NOTE — Telephone Encounter (Signed)
I do not have any appointments available after 4. I'd advise him to go to the urgent care.

## 2022-05-13 NOTE — Telephone Encounter (Signed)
Called the pt and let him know that the provider advise that he go to UC.  Pt verbalized understanding and will do that.

## 2022-05-14 ENCOUNTER — Other Ambulatory Visit: Payer: Self-pay | Admitting: Family Medicine

## 2022-05-14 DIAGNOSIS — I10 Essential (primary) hypertension: Secondary | ICD-10-CM

## 2022-05-16 ENCOUNTER — Other Ambulatory Visit: Payer: Self-pay | Admitting: Family Medicine

## 2022-05-16 DIAGNOSIS — I10 Essential (primary) hypertension: Secondary | ICD-10-CM

## 2022-05-16 MED ORDER — AMLODIPINE BESYLATE 5 MG PO TABS
5.0000 mg | ORAL_TABLET | Freq: Every day | ORAL | 3 refills | Status: DC
Start: 2022-05-16 — End: 2022-05-27

## 2022-05-16 NOTE — Telephone Encounter (Signed)
Requested Prescriptions  Pending Prescriptions Disp Refills   amLODipine (NORVASC) 5 MG tablet [Pharmacy Med Name: amLODIPine Besylate 5 MG Oral Tablet] 30 tablet 0    Sig: Take 1 tablet by mouth once daily     Cardiovascular: Calcium Channel Blockers 2 Passed - 05/14/2022 10:32 AM      Passed - Last BP in normal range    BP Readings from Last 1 Encounters:  04/29/22 125/71         Passed - Last Heart Rate in normal range    Pulse Readings from Last 1 Encounters:  04/29/22 73         Passed - Valid encounter within last 6 months    Recent Outpatient Visits           2 weeks ago Gross hematuria   [place] [facility] [place], [place] P, DO   2 weeks ago Acute non-recurrent maxillary sinusitis   [facility] [facility] [place], Megan P, DO   4 weeks ago Upper respiratory tract infection, unspecified type   [place] [facility] [place], Sherran Needs, NP   4 months ago Acute right-sided low back pain without sciatica   [place] [facility] Mecum, Erin E, PA-C   5 months ago Acute right-sided low back pain without sciatica   [place] [facility] Mecum, Oswaldo Conroy, PA-C       Future Appointments             In 1 week Laural Benes, Oralia Rud, DO [place] [facility], [facility]   In 2 months Laural Benes, Oralia Rud, DO [place] [facility], [facility]

## 2022-05-16 NOTE — Telephone Encounter (Signed)
Medication Refill - Medication: amLODipine (NORVASC) 5 MG tablet   Has the patient contacted their pharmacy? Yes.    Pt states the Pharmacy only gave him a 5 day supply on Saturday 05/14/22. Pt has an appt on 05/27/22 with PCP and is needing a short supply to last until this appt.    Preferred Pharmacy (with phone number or street name):  [facility] [address] [place]), [place] - [address] Phone: [phone]  Fax: [phone]     Has the patient been seen for an appointment in the last year OR does the patient have an upcoming appointment? Yes.    Agent: Please be advised that RX refills may take up to 3 business days. We ask that you follow-up with your pharmacy.

## 2022-05-16 NOTE — Telephone Encounter (Signed)
Resending as provider initially sent on 05/11/22.   Requested Prescriptions  Pending Prescriptions Disp Refills   amLODipine (NORVASC) 5 MG tablet 90 tablet 3    Sig: Take 1 tablet (5 mg total) by mouth daily.     Cardiovascular: Calcium Channel Blockers 2 Passed - 05/16/2022 11:59 AM      Passed - Last BP in normal range    BP Readings from Last 1 Encounters:  04/29/22 125/71         Passed - Last Heart Rate in normal range    Pulse Readings from Last 1 Encounters:  04/29/22 73         Passed - Valid encounter within last 6 months    Recent Outpatient Visits           2 weeks ago Gross hematuria   [place] [facility] [place], [place] P, DO   2 weeks ago Acute non-recurrent maxillary sinusitis   [place] [facility] [place], Megan P, DO   4 weeks ago Upper respiratory tract infection, unspecified type   [place] [facility], Sherran Needs, NP   4 months ago Acute right-sided low back pain without sciatica   [place] [facility] Mecum, Erin E, PA-C   5 months ago Acute right-sided low back pain without sciatica   [place] [facility] Mecum, Oswaldo Conroy, PA-C       Future Appointments             In 1 week Laural Benes, Oralia Rud, DO [place] [facility], [facility]   In 2 months Laural Benes, Oralia Rud, DO  [facility], [facility]

## 2022-05-16 NOTE — Telephone Encounter (Signed)
[facility] called and spoke to [place], Pensions consultant about the refill(s) amlodipine requested. Advised what the patient said. She first said they gave him 5 because they didn't have any more in stock, then she said he was given 5 because he had no more refills on file. Advised it was sent on 05/11/22 #90/3 refill(s). She says they did not receive that refill. Advised it will be resent.

## 2022-05-23 DIAGNOSIS — M109 Gout, unspecified: Secondary | ICD-10-CM | POA: Diagnosis not present

## 2022-05-27 ENCOUNTER — Ambulatory Visit: Payer: BC Managed Care – PPO | Admitting: Family Medicine

## 2022-05-27 ENCOUNTER — Encounter: Payer: Self-pay | Admitting: Family Medicine

## 2022-05-27 VITALS — BP 110/64 | HR 77 | Temp 97.8°F | Ht 67.0 in | Wt 161.2 lb

## 2022-05-27 DIAGNOSIS — I10 Essential (primary) hypertension: Secondary | ICD-10-CM | POA: Diagnosis not present

## 2022-05-27 DIAGNOSIS — R310 Gross hematuria: Secondary | ICD-10-CM | POA: Diagnosis not present

## 2022-05-27 MED ORDER — HYDROCHLOROTHIAZIDE 25 MG PO TABS: 25.0000 mg | ORAL_TABLET | Freq: Every day | ORAL | 1 refills | Status: DC

## 2022-05-27 MED ORDER — CLOTRIMAZOLE-BETAMETHASONE 1-0.05 % EX CREA: TOPICAL_CREAM | CUTANEOUS | 1 refills | Status: DC

## 2022-05-27 MED ORDER — AMLODIPINE BESYLATE 5 MG PO TABS: 5.0000 mg | ORAL_TABLET | Freq: Every day | ORAL | 1 refills | Status: DC

## 2022-05-27 NOTE — Progress Notes (Signed)
BP 110/64   Pulse 77   Temp 97.8 F (36.6 C) (Oral)   Ht 5\' 7"  (1.702 m)   Wt 161 lb 3.2 oz (73.1 kg)   SpO2 97%   BMI 25.25 kg/m    Subjective:    Patient ID: Shaun Carney, male    DOB: November 21, 1960, 62 y.o.   MRN: [id number]  HPI: Jaworski Round is a 62 y.o. male  No chief complaint on file.  HYPERTENSION  Hypertension status: controlled  Satisfied with current treatment? yes Duration of hypertension: chronic BP monitoring frequency:  not checking BP medication side effects:  no Medication compliance: excellent compliance Previous BP meds:amlodipine, hctz Aspirin: no Recurrent headaches: no Visual changes: no Palpitations: no Dyspnea: no Chest pain: no Lower extremity edema: no Dizzy/lightheaded: no  URINARY SYMPTOMS- resolved. Feeling better Duration: about a month Dysuria: no Urinary frequency: no Urgency: no Small volume voids: no Symptom severity: no Urinary incontinence: no Foul odor: no Hematuria: no Abdominal pain: no Back pain: no Suprapubic pain/pressure: no Flank pain: no Fever:  no Vomiting: no Relief with cranberry juice: no Relief with pyridium: no Status: better Previous urinary tract infection: no History of sexually transmitted disease: no Penile discharge: no Treatments attempted: antibiotics and increasing fluids   Relevant past medical, surgical, family and social history reviewed and updated as indicated. Interim medical history since our last visit reviewed. Allergies and medications reviewed and updated.  Review of Systems  Constitutional: Negative.   Respiratory: Negative.    Cardiovascular: Negative.   Gastrointestinal: Negative.   Musculoskeletal: Negative.   Neurological: Negative.   Psychiatric/Behavioral: Negative.      Per HPI unless specifically indicated above     Objective:    BP 110/64   Pulse 77   Temp 97.8 F (36.6 C) (Oral)   Ht 5\' 7"  (1.702 m)   Wt 161 lb 3.2 oz (73.1 kg)   SpO2 97%   BMI 25.25  kg/m   Wt Readings from Last 3 Encounters:  05/27/22 161 lb 3.2 oz (73.1 kg)  04/29/22 166 lb (75.3 kg)  04/27/22 166 lb 11.2 oz (75.6 kg)    Physical Exam Vitals and nursing note reviewed.  Constitutional:      General: He is not in acute distress.    Appearance: Normal appearance. He is not ill-appearing, toxic-appearing or diaphoretic.  HENT:     Head: Normocephalic and atraumatic.     Right Ear: External ear normal.     Left Ear: External ear normal.     Nose: Nose normal.     Mouth/Throat:     Mouth: Mucous membranes are moist.     Pharynx: Oropharynx is clear.  Eyes:     General: No scleral icterus.       Right eye: No discharge.        Left eye: No discharge.     Extraocular Movements: Extraocular movements intact.     Conjunctiva/sclera: Conjunctivae normal.     Pupils: Pupils are equal, round, and reactive to light.  Cardiovascular:     Rate and Rhythm: Normal rate and regular rhythm.     Pulses: Normal pulses.     Heart sounds: Normal heart sounds. No murmur heard.    No friction rub. No gallop.  Pulmonary:     Effort: Pulmonary effort is normal. No respiratory distress.     Breath sounds: Normal breath sounds. No stridor. No wheezing, rhonchi or rales.  Chest:     Chest wall: No tenderness.  Musculoskeletal:        General: Normal range of motion.     Cervical back: Normal range of motion and neck supple.  Skin:    General: Skin is warm and dry.     Capillary Refill: Capillary refill takes less than 2 seconds.     Coloration: Skin is not jaundiced or pale.     Findings: No bruising, erythema, lesion or rash.  Neurological:     General: No focal deficit present.     Mental Status: He is alert and oriented to person, place, and time. Mental status is at baseline.  Psychiatric:        Mood and Affect: Mood normal.        Behavior: Behavior normal.        Thought Content: Thought content normal.        Judgment: Judgment normal.     Results for orders  placed or performed in visit on 04/29/22  Urine Culture   Specimen: Urine   UR  Result Value Ref Range   Urine Culture, Routine Final report    Organism ID, Bacteria No growth   Microscopic Examination  Result Value Ref Range   WBC, UA None seen 0 - 5 /hpf   RBC, Urine None seen 0 - 2 /hpf   Epithelial Cells (non renal) None seen 0 - 10 /hpf   Casts None seen None seen /lpf   Bacteria, UA None seen None seen/Few  Urinalysis, Routine w reflex microscopic  Result Value Ref Range   Specific Gravity, UA      >=1.030 (A) 1.005 - 1.030   pH, UA 5.5 5.0 - 7.5   Color, UA Yellow Yellow   Appearance Ur Clear Clear   Leukocytes,UA Negative Negative   Protein,UA 1+ (A) Negative/Trace   Glucose, UA Negative Negative   Ketones, UA Negative Negative   RBC, UA Negative Negative   Bilirubin, UA Negative Negative   Urobilinogen, Ur 1.0 0.2 - 1.0 mg/dL   Nitrite, UA Negative Negative   Microscopic Examination See below:       Assessment & Plan:   Problem List Items Addressed This Visit       Cardiovascular and Mediastinum   Essential hypertension    Under good control on current regimen. Continue current regimen. Continue to monitor. Call with any concerns. Refills given. Labs drawn today.       Relevant Medications   hydrochlorothiazide (HYDRODIURIL) 25 MG tablet   amLODipine (NORVASC) 5 MG tablet   Other Relevant Orders   Basic metabolic panel   Other Visit Diagnoses     Gross hematuria    -  Primary   Resolved. No pain. Call with any concerns.   Relevant Orders   Urinalysis, Routine w reflex microscopic        Follow up plan: Return as scheduled.

## 2022-05-27 NOTE — Assessment & Plan Note (Signed)
Under good control on current regimen. Continue current regimen. Continue to monitor. Call with any concerns. Refills given. Labs drawn today.   

## 2022-05-30 LAB — URINALYSIS, ROUTINE W REFLEX MICROSCOPIC
Bilirubin, UA: NEGATIVE
Glucose, UA: NEGATIVE
Ketones, UA: NEGATIVE
Leukocytes,UA: NEGATIVE
Nitrite, UA: NEGATIVE
RBC, UA: NEGATIVE
Specific Gravity, UA: >1.03 — ABNORMAL HIGH (ref 1.005–1.030)
Urobilinogen, Ur: 0.2 mg/dL (ref 0.2–1.0)
pH, UA: 5.5 (ref 5.0–7.5)

## 2022-05-30 LAB — MICROSCOPIC EXAMINATION
Bacteria, UA: NONE SEEN
RBC, Urine: NONE SEEN /hpf (ref 0–2)

## 2022-05-31 ENCOUNTER — Other Ambulatory Visit: Payer: BC Managed Care – PPO

## 2022-05-31 ENCOUNTER — Ambulatory Visit (INDEPENDENT_AMBULATORY_CARE_PROVIDER_SITE_OTHER): Payer: BC Managed Care – PPO | Admitting: Nurse Practitioner

## 2022-05-31 ENCOUNTER — Encounter: Payer: Self-pay | Admitting: Nurse Practitioner

## 2022-05-31 DIAGNOSIS — I10 Essential (primary) hypertension: Secondary | ICD-10-CM | POA: Diagnosis not present

## 2022-05-31 NOTE — Progress Notes (Signed)
Left without being seen.

## 2022-06-01 ENCOUNTER — Other Ambulatory Visit: Payer: Self-pay | Admitting: Family Medicine

## 2022-06-01 DIAGNOSIS — E876 Hypokalemia: Secondary | ICD-10-CM

## 2022-06-01 LAB — BASIC METABOLIC PANEL
BUN/Creatinine Ratio: 19 (ref 10–24)
BUN: 24 mg/dL (ref 8–27)
CO2: 29 mmol/L (ref 20–29)
Calcium: 9.1 mg/dL (ref 8.6–10.2)
Chloride: 97 mmol/L (ref 96–106)
Creatinine, Ser: 1.24 mg/dL (ref 0.76–1.27)
Glucose: 103 mg/dL — ABNORMAL HIGH (ref 70–99)
Potassium: 3 mmol/L — ABNORMAL LOW (ref 3.5–5.2)
Sodium: 143 mmol/L (ref 134–144)
eGFR: 66 mL/min/{1.73_m2} (ref >59)

## 2022-06-01 MED ORDER — POTASSIUM CHLORIDE CRYS ER 20 MEQ PO TBCR: 20.0000 meq | EXTENDED_RELEASE_TABLET | Freq: Every day | ORAL | 3 refills | Status: DC

## 2022-06-02 DIAGNOSIS — M109 Gout, unspecified: Secondary | ICD-10-CM | POA: Diagnosis not present

## 2022-06-09 ENCOUNTER — Other Ambulatory Visit: Payer: BC Managed Care – PPO

## 2022-06-10 ENCOUNTER — Other Ambulatory Visit: Payer: BC Managed Care – PPO

## 2022-06-10 DIAGNOSIS — E876 Hypokalemia: Secondary | ICD-10-CM | POA: Diagnosis not present

## 2022-06-11 LAB — BASIC METABOLIC PANEL WITH GFR
BUN/Creatinine Ratio: 15 (ref 10–24)
BUN: 16 mg/dL (ref 8–27)
CO2: 26 mmol/L (ref 20–29)
Calcium: 9.2 mg/dL (ref 8.6–10.2)
Chloride: 100 mmol/L (ref 96–106)
Creatinine, Ser: 1.08 mg/dL (ref 0.76–1.27)
Glucose: 126 mg/dL — ABNORMAL HIGH (ref 70–99)
Potassium: 3.8 mmol/L (ref 3.5–5.2)
Sodium: 139 mmol/L (ref 134–144)
eGFR: 78 mL/min/1.73

## 2022-06-13 ENCOUNTER — Other Ambulatory Visit: Payer: Self-pay | Admitting: Family Medicine

## 2022-06-13 ENCOUNTER — Encounter: Payer: Self-pay | Admitting: Family Medicine

## 2022-06-13 DIAGNOSIS — I10 Essential (primary) hypertension: Secondary | ICD-10-CM

## 2022-06-14 DIAGNOSIS — N401 Enlarged prostate with lower urinary tract symptoms: Secondary | ICD-10-CM | POA: Diagnosis not present

## 2022-06-14 DIAGNOSIS — N138 Other obstructive and reflux uropathy: Secondary | ICD-10-CM | POA: Diagnosis not present

## 2022-06-14 DIAGNOSIS — I10 Essential (primary) hypertension: Secondary | ICD-10-CM | POA: Diagnosis not present

## 2022-06-14 DIAGNOSIS — R350 Frequency of micturition: Secondary | ICD-10-CM | POA: Diagnosis not present

## 2022-06-14 NOTE — Telephone Encounter (Signed)
Requested by interface surescripts. Last refill doc. 05/12/22 #90 . Future visit in 1 month. Too soon for refill. Requested Prescriptions  Refused Prescriptions Disp Refills   benazepril (LOTENSIN) 40 MG tablet [Pharmacy Med Name: Benazepril HCl 40 MG Oral Tablet] 90 tablet 0    Sig: Take 1 tablet by mouth once daily     Cardiovascular:  ACE Inhibitors Passed - 06/13/2022  1:59 PM      Passed - Cr in normal range and within 180 days    Creatinine  Date Value Ref Range Status  01/11/2013 1.04 0.60 - 1.30 mg/dL Final   Creatinine, Ser  Date Value Ref Range Status  06/10/2022 1.08 0.76 - 1.27 mg/dL Final         Passed - K in normal range and within 180 days    Potassium  Date Value Ref Range Status  06/10/2022 3.8 3.5 - 5.2 mmol/L Final  01/11/2013 3.7 3.5 - 5.1 mmol/L Final         Passed - Patient is not pregnant      Passed - Last BP in normal range    BP Readings from Last 1 Encounters:  05/31/22 109/63         Passed - Valid encounter within last 6 months    Recent Outpatient Visits           2 weeks ago Essential hypertension   [place] [facility] [place], Corrie Dandy T, NP   2 weeks ago Gross hematuria   [place] [facility] [place], [place], DO   1 month ago Gross hematuria   [place] [facility] [place], Megan P, DO   1 month ago Acute non-recurrent maxillary sinusitis   [place] [facility] [place], Megan P, DO   1 month ago Upper respiratory tract infection, unspecified type   [place] [facility] [place], Sherran Needs, NP       Future Appointments             In 1 month Johnson, Oralia Rud, DO [place], [facility]

## 2022-06-15 DIAGNOSIS — S39012A Strain of muscle, fascia and tendon of lower back, initial encounter: Secondary | ICD-10-CM | POA: Diagnosis not present

## 2022-06-15 DIAGNOSIS — M9902 Segmental and somatic dysfunction of thoracic region: Secondary | ICD-10-CM | POA: Diagnosis not present

## 2022-06-15 DIAGNOSIS — M9903 Segmental and somatic dysfunction of lumbar region: Secondary | ICD-10-CM | POA: Diagnosis not present

## 2022-06-15 DIAGNOSIS — M62830 Muscle spasm of back: Secondary | ICD-10-CM | POA: Diagnosis not present

## 2022-06-21 DIAGNOSIS — M9902 Segmental and somatic dysfunction of thoracic region: Secondary | ICD-10-CM | POA: Diagnosis not present

## 2022-06-21 DIAGNOSIS — M62451 Contracture of muscle, right thigh: Secondary | ICD-10-CM | POA: Diagnosis not present

## 2022-06-21 DIAGNOSIS — M62830 Muscle spasm of back: Secondary | ICD-10-CM | POA: Diagnosis not present

## 2022-06-21 DIAGNOSIS — M9906 Segmental and somatic dysfunction of lower extremity: Secondary | ICD-10-CM | POA: Diagnosis not present

## 2022-06-21 DIAGNOSIS — S39012A Strain of muscle, fascia and tendon of lower back, initial encounter: Secondary | ICD-10-CM | POA: Diagnosis not present

## 2022-06-21 DIAGNOSIS — M9903 Segmental and somatic dysfunction of lumbar region: Secondary | ICD-10-CM | POA: Diagnosis not present

## 2022-06-22 DIAGNOSIS — M62830 Muscle spasm of back: Secondary | ICD-10-CM | POA: Diagnosis not present

## 2022-06-22 DIAGNOSIS — M9906 Segmental and somatic dysfunction of lower extremity: Secondary | ICD-10-CM | POA: Diagnosis not present

## 2022-06-22 DIAGNOSIS — S39012A Strain of muscle, fascia and tendon of lower back, initial encounter: Secondary | ICD-10-CM | POA: Diagnosis not present

## 2022-06-22 DIAGNOSIS — M9902 Segmental and somatic dysfunction of thoracic region: Secondary | ICD-10-CM | POA: Diagnosis not present

## 2022-06-22 DIAGNOSIS — M62451 Contracture of muscle, right thigh: Secondary | ICD-10-CM | POA: Diagnosis not present

## 2022-06-22 DIAGNOSIS — M9903 Segmental and somatic dysfunction of lumbar region: Secondary | ICD-10-CM | POA: Diagnosis not present

## 2022-06-27 DIAGNOSIS — M9903 Segmental and somatic dysfunction of lumbar region: Secondary | ICD-10-CM | POA: Diagnosis not present

## 2022-06-27 DIAGNOSIS — M9902 Segmental and somatic dysfunction of thoracic region: Secondary | ICD-10-CM | POA: Diagnosis not present

## 2022-06-27 DIAGNOSIS — M62451 Contracture of muscle, right thigh: Secondary | ICD-10-CM | POA: Diagnosis not present

## 2022-06-27 DIAGNOSIS — M62830 Muscle spasm of back: Secondary | ICD-10-CM | POA: Diagnosis not present

## 2022-06-27 DIAGNOSIS — M9906 Segmental and somatic dysfunction of lower extremity: Secondary | ICD-10-CM | POA: Diagnosis not present

## 2022-06-27 DIAGNOSIS — S39012A Strain of muscle, fascia and tendon of lower back, initial encounter: Secondary | ICD-10-CM | POA: Diagnosis not present

## 2022-07-05 ENCOUNTER — Encounter: Payer: Self-pay | Admitting: Urology

## 2022-07-07 ENCOUNTER — Encounter: Payer: Self-pay | Admitting: Urology

## 2022-07-15 ENCOUNTER — Ambulatory Visit: Payer: BC Managed Care – PPO | Admitting: Urology

## 2022-07-15 ENCOUNTER — Encounter: Payer: Self-pay | Admitting: Urology

## 2022-07-15 VITALS — BP 148/80 | HR 75 | Ht 67.0 in | Wt 160.0 lb

## 2022-07-15 DIAGNOSIS — N486 Induration penis plastica: Secondary | ICD-10-CM

## 2022-07-15 MED ORDER — TADALAFIL 20 MG PO TABS: ORAL_TABLET | ORAL | 0 refills | Status: DC

## 2022-07-15 NOTE — Progress Notes (Signed)
I, Shaun Carney,acting as a scribe for Riki Altes, MD.,have documented all relevant documentation on the behalf of Riki Altes, MD,as directed by  Riki Altes, MD while in the presence of Riki Altes, MD.  07/15/2022 10:16 AM   Shaun Carney 05/06/1960 [id number]  Referring provider: Lauro Regulus, MD [address] [facility] [address] [place],  [place] [address]  Chief Complaint  Patient presents with   Other    Peyronie disease     HPI: Shaun Carney is a 62 y.o. male referred for evaluation of Peyronie's disease.  3 week history of penile curvature, complains of right lateral curvature at the base, which he estimates at ~45 degrees. No pain with erections, he states at point of curvature penis is soft and he has difficulty maintaining an erection.  No prior history of penile trauma/fracture. He was given a trial of Sildenafil 20 mg daily with slight improvement in the firmness of his erections.    PMH: Past Medical History:  Diagnosis Date   Anxiety    COVID-19 05/24/2018   GERD (gastroesophageal reflux disease)    Hypertension     Surgical History: Past Surgical History:  Procedure Laterality Date   CATARACT EXTRACTION W/PHACO Left 09/29/2016   Procedure: CATARACT EXTRACTION PHACO AND INTRAOCULAR LENS PLACEMENT (IOC);  Surgeon: Nevada Crane, MD;  Location: [facility] ORS;  Service: Ophthalmology;  Laterality: Left;  Lot # [id number] H US:00:40.4 AP%: 7.0 CDE: 2.87   COLONOSCOPY WITH PROPOFOL N/A 04/03/2020   Procedure: COLONOSCOPY WITH BIOPSY;  Surgeon: Midge Minium, MD;  Location: [facility];  Service: Endoscopy;  Laterality: N/A;  leave first case   ESOPHAGOGASTRODUODENOSCOPY (EGD) WITH PROPOFOL N/A 04/03/2020   Procedure: ESOPHAGOGASTRODUODENOSCOPY (EGD) WITH PROPOFOL;  Surgeon: Midge Minium, MD;  Location: [facility];  Service: Endoscopy;  Laterality: N/A;   EYE SURGERY     INSERTION OF AHMED VALVE Left 05/18/2015    Procedure: INSERTION OF AHMED VALVE AND SCLERAL PATCH GRAFT TO LEFT EYE;  Surgeon: Sherald Hess, MD;  Location: [facility];  Service: Ophthalmology;  Laterality: Left;  1ST CASE PER DR VIN   POLYPECTOMY N/A 04/03/2020   Procedure: POLYPECTOMY;  Surgeon: Midge Minium, MD;  Location: [facility];  Service: Endoscopy;  Laterality: N/A;    Home Medications:  Allergies as of 07/15/2022   No Known Allergies      Medication List        Accurate as of July 15, 2022 10:16 AM. If you have any questions, ask your nurse or doctor.          amLODipine 5 MG tablet Commonly known as: NORVASC Take 1 tablet (5 mg total) by mouth daily.   benazepril 40 MG tablet Commonly known as: LOTENSIN Take 1 tablet by mouth once daily   clotrimazole-betamethasone cream Commonly known as: LOTRISONE APPLY  CREAM TOPICALLY TWICE DAILY   fluticasone 50 MCG/ACT nasal spray Commonly known as: FLONASE SHAKE LIQUID AND USE 2 SPRAYS IN EACH NOSTRIL EVERY DAY   hydrochlorothiazide 25 MG tablet Commonly known as: HYDRODIURIL Take 1 tablet (25 mg total) by mouth daily.   pantoprazole 40 MG tablet Commonly known as: PROTONIX Take 1 tablet by mouth once daily   potassium chloride SA 20 MEQ tablet Commonly known as: KLOR-CON M Take 1 tablet (20 mEq total) by mouth daily.   sildenafil 20 MG tablet Commonly known as: REVATIO Take 1 tablet (20 mg total) by mouth daily  as needed.   tadalafil 20 MG tablet Commonly known as: CIALIS Take 1 tab 1 hour prior to intercourse Started by: Riki Altes, MD        Allergies: No Known Allergies  Family History: Family History  Problem Relation Age of Onset   Cancer Mother        throat   Hypertension Mother    Heart attack Mother    Cancer Father        lung    Social History:  reports that he has never smoked. He has never used smokeless tobacco. He reports current alcohol use of about 6.0 standard drinks of alcohol per  week. He reports that he does not use drugs.   Physical Exam: BP (!) 148/80   Pulse 75   Ht 5\' 7"  (1.702 m)   Wt 160 lb (72.6 kg)   BMI 25.06 kg/m   Constitutional:  Alert and oriented, No acute distress. HEENT: [place] AT, moist mucus membranes.  Trachea midline, no masses. Cardiovascular: No clubbing, cyanosis, or edema. Respiratory: Normal respiratory effort, no increased work of breathing. GI: Abdomen is soft, nontender, nondistended, no abdominal masses GU: base of the penis is a >2 cm plaque, palpable dorsally and extending laterally on both sides. Testes descended bilaterally without masses or tenderness.  Psychiatric: Normal mood and affect.   Assessment & Plan:    1. 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  We discussed the acute and chronic phases of Peyronie's; he's only had symptoms for 3 weeks and no treatment recommended until curvature stable for at least 3 months. We discuss the possibility of Xiaflex and surgical treatment options once he has reached the chronic phase.  Trial Tadalafil 20 mg 1 hour prior to intercourse to see if his erection quality can be improved.  PA follow-up 3 months for recheck.  I have reviewed the above documentation for accuracy and completeness, and I agree with the above.   Riki Altes, MD  [facility] [address], [address] [place], [place] [address] [phone]

## 2022-07-22 ENCOUNTER — Encounter: Payer: BC Managed Care – PPO | Admitting: Family Medicine

## 2022-08-24 DIAGNOSIS — H1032 Unspecified acute conjunctivitis, left eye: Secondary | ICD-10-CM | POA: Diagnosis not present

## 2022-09-22 ENCOUNTER — Ambulatory Visit: Payer: BC Managed Care – PPO | Admitting: Podiatry

## 2022-10-04 ENCOUNTER — Ambulatory Visit: Payer: BC Managed Care – PPO | Admitting: Podiatry

## 2022-10-04 ENCOUNTER — Encounter: Payer: Self-pay | Admitting: Podiatry

## 2022-10-04 DIAGNOSIS — L603 Nail dystrophy: Secondary | ICD-10-CM | POA: Diagnosis not present

## 2022-10-04 NOTE — Progress Notes (Signed)
  Subjective:  Patient ID: Shaun Carney, male    DOB: Jun 24, 1960,  MRN: [id number]  Chief Complaint  Patient presents with   Nail Problem    62 y.o. male presents with the above complaint.  Patient presents with bilateral microtrauma to the nail.  Patient wears a lot of steel toe boots.  He started noticing white lining/ridging across the nail.  He wanted to get evaluated.  He wants to make sure is not nail fungus.  Denies any other acute complaints.   Review of Systems: Negative except as noted in the HPI. Denies N/V/F/Ch.  Past Medical History:  Diagnosis Date   Anxiety    COVID-19 05/24/2018   GERD (gastroesophageal reflux disease)    Hypertension     Current Outpatient Medications:    amLODipine (NORVASC) 5 MG tablet, Take 1 tablet (5 mg total) by mouth daily., Disp: 90 tablet, Rfl: 1   benazepril (LOTENSIN) 40 MG tablet, Take 1 tablet by mouth once daily, Disp: 90 tablet, Rfl: 0   clotrimazole-betamethasone (LOTRISONE) cream, APPLY  CREAM TOPICALLY TWICE DAILY, Disp: 30 g, Rfl: 1   fluticasone (FLONASE) 50 MCG/ACT nasal spray, SHAKE LIQUID AND USE 2 SPRAYS IN EACH NOSTRIL EVERY DAY, Disp: 16 g, Rfl: 0   hydrochlorothiazide (HYDRODIURIL) 25 MG tablet, Take 1 tablet (25 mg total) by mouth daily., Disp: 90 tablet, Rfl: 1   pantoprazole (PROTONIX) 40 MG tablet, Take 1 tablet by mouth once daily, Disp: 90 tablet, Rfl: 1   potassium chloride SA (KLOR-CON M) 20 MEQ tablet, Take 1 tablet (20 mEq total) by mouth daily., Disp: 30 tablet, Rfl: 3   sildenafil (REVATIO) 20 MG tablet, Take 1 tablet (20 mg total) by mouth daily as needed., Disp: 50 tablet, Rfl: 12   tadalafil (CIALIS) 20 MG tablet, Take 1 tab 1 hour prior to intercourse, Disp: 10 tablet, Rfl: 0  Social History   Tobacco Use  Smoking Status Never  Smokeless Tobacco Never    No Known Allergies Objective:  There were no vitals filed for this visit. There is no height or weight on file to calculate BMI. Constitutional  Well developed. Well nourished.  Vascular Dorsalis pedis pulses palpable bilaterally. Posterior tibial pulses palpable bilaterally. Capillary refill normal to all digits.  No cyanosis or clubbing noted. Pedal hair growth normal.  Neurologic Normal speech. Oriented to person, place, and time. Epicritic sensation to light touch grossly present bilaterally.  Dermatologic Nails microtrauma ridging noted across the nail.  No signs of nail fungus noted.  Mostly nail dystrophy Skin within normal limits  Orthopedic: Normal joint ROM without pain or crepitus bilaterally. No visible deformities. No bony tenderness.   Radiographs: None Assessment:   1. Nail dystrophy    Plan:  Patient was evaluated and treated and all questions answered.  Nail dystrophy -All questions and concerns were discussed with the patient extensive detail given the amount of dystrophy that is present patient will benefit from making changes to her shoes.  I discussed shoe gear modification with wider toebox shoes.  Encouraged him to wear more style tennis shoes even with composite toe.  He states understanding  No follow-ups on file.

## 2022-10-12 ENCOUNTER — Other Ambulatory Visit: Payer: Self-pay | Admitting: Family Medicine

## 2022-10-13 NOTE — Telephone Encounter (Signed)
Requested Prescriptions  Pending Prescriptions Disp Refills   pantoprazole (PROTONIX) 40 MG tablet [Pharmacy Med Name: Pantoprazole Sodium 40 MG Oral Tablet Delayed Release] 90 tablet 0    Sig: Take 1 tablet by mouth once daily     Gastroenterology: Proton Pump Inhibitors Passed - 10/12/2022  5:49 PM      Passed - Valid encounter within last 12 months    Recent Outpatient Visits           4 months ago Essential hypertension   [place] [facility] [place], Corrie Dandy T, NP   4 months ago Gross hematuria   [place] [facility] [place], [place], DO   5 months ago Gross hematuria   [place] [facility] [place], Megan P, DO   5 months ago Acute non-recurrent maxillary sinusitis   [place] [facility] [place], Megan P, DO   5 months ago Upper respiratory tract infection, unspecified type   [place] [facility], Sherran Needs, NP       Future Appointments             In 1 week [place], Elana Alm [facility] [place]

## 2022-10-21 ENCOUNTER — Ambulatory Visit: Payer: BC Managed Care – PPO | Admitting: Urology

## 2022-11-25 DIAGNOSIS — M109 Gout, unspecified: Secondary | ICD-10-CM | POA: Diagnosis not present

## 2023-01-13 ENCOUNTER — Other Ambulatory Visit: Payer: Self-pay | Admitting: Family Medicine

## 2023-01-13 NOTE — Telephone Encounter (Signed)
Requested Prescriptions  Pending Prescriptions Disp Refills   pantoprazole (PROTONIX) 40 MG tablet [Pharmacy Med Name: Pantoprazole Sodium 40 MG Oral Tablet Delayed Release] 90 tablet 0    Sig: Take 1 tablet by mouth once daily     Gastroenterology: Proton Pump Inhibitors Passed - 01/13/2023  3:20 PM      Passed - Valid encounter within last 12 months    Recent Outpatient Visits           7 months ago Essential hypertension   [place] [facility] [place], Corrie Dandy T, NP   7 months ago Gross hematuria   [place] [facility] [place], [place], DO   8 months ago Gross hematuria   [place] [facility] [place], Megan P, DO   8 months ago Acute non-recurrent maxillary sinusitis   [facility] [place], Megan P, DO   9 months ago Upper respiratory tract infection, unspecified type   [place] [facility] [place] Cooks, NP

## 2023-04-10 ENCOUNTER — Other Ambulatory Visit: Payer: Self-pay | Admitting: Family Medicine

## 2023-04-12 NOTE — Telephone Encounter (Signed)
 Requested Prescriptions  Pending Prescriptions Disp Refills   pantoprazole (PROTONIX) 40 MG tablet [Pharmacy Med Name: Pantoprazole Sodium 40 MG Oral Tablet Delayed Release] 90 tablet 0    Sig: Take 1 tablet by mouth once daily     Gastroenterology: Proton Pump Inhibitors Passed - 04/12/2023  8:57 AM      Passed - Valid encounter within last 12 months    Recent Outpatient Visits           10 months ago Essential hypertension   [place] [facility] [place], Corrie Dandy T, NP   10 months ago Gross hematuria   [place] [facility] [place], [place], DO   11 months ago Gross hematuria   [place] [facility], Megan P, DO   11 months ago Acute non-recurrent maxillary sinusitis   [place] [facility] [place], Megan P, DO   11 months ago Upper respiratory tract infection, unspecified type   [place] [facility] Weber Cooks, NP

## 2023-04-14 DIAGNOSIS — Z125 Encounter for screening for malignant neoplasm of prostate: Secondary | ICD-10-CM | POA: Diagnosis not present

## 2023-04-14 DIAGNOSIS — I10 Essential (primary) hypertension: Secondary | ICD-10-CM | POA: Diagnosis not present

## 2023-04-14 DIAGNOSIS — N401 Enlarged prostate with lower urinary tract symptoms: Secondary | ICD-10-CM | POA: Diagnosis not present

## 2023-04-14 DIAGNOSIS — R350 Frequency of micturition: Secondary | ICD-10-CM | POA: Diagnosis not present

## 2023-04-14 DIAGNOSIS — M109 Gout, unspecified: Secondary | ICD-10-CM | POA: Diagnosis not present

## 2023-05-02 DIAGNOSIS — R051 Acute cough: Secondary | ICD-10-CM | POA: Diagnosis not present

## 2023-05-02 DIAGNOSIS — Z03818 Encounter for observation for suspected exposure to other biological agents ruled out: Secondary | ICD-10-CM | POA: Diagnosis not present

## 2023-05-02 DIAGNOSIS — J189 Pneumonia, unspecified organism: Secondary | ICD-10-CM | POA: Diagnosis not present

## 2023-05-02 DIAGNOSIS — J329 Chronic sinusitis, unspecified: Secondary | ICD-10-CM | POA: Diagnosis not present

## 2023-06-08 ENCOUNTER — Other Ambulatory Visit: Payer: Self-pay | Admitting: Family Medicine

## 2023-06-08 DIAGNOSIS — I10 Essential (primary) hypertension: Secondary | ICD-10-CM

## 2023-06-12 NOTE — Telephone Encounter (Signed)
 OFFICE VISIT NEEDED FOR ADDITIONAL REFILLS.  Requested Prescriptions  Pending Prescriptions Disp Refills   amLODipine  (NORVASC ) 5 MG tablet [Pharmacy Med Name: amLODIPine  Besylate 5 MG Oral Tablet] 30 tablet 0    Sig: Take 1 tablet by mouth once daily     Cardiovascular: Calcium Channel Blockers 2 Failed - 06/12/2023 12:10 PM      Failed - Last BP in normal range    BP Readings from Last 1 Encounters:  07/15/22 (!) 148/80         Failed - Valid encounter within last 6 months    Recent Outpatient Visits   None            Passed - Last Heart Rate in normal range    Pulse Readings from Last 1 Encounters:  07/15/22 75

## 2023-06-17 ENCOUNTER — Other Ambulatory Visit: Payer: Self-pay | Admitting: Family Medicine

## 2023-06-17 DIAGNOSIS — I10 Essential (primary) hypertension: Secondary | ICD-10-CM

## 2023-06-21 NOTE — Telephone Encounter (Signed)
 Unable to refill per protocol, courtesy refill already given, OV needed.  Requested Prescriptions  Pending Prescriptions Disp Refills   amLODipine  (NORVASC ) 5 MG tablet [Pharmacy Med Name: amLODIPine  Besylate 5 MG Oral Tablet] 30 tablet 0    Sig: Take 1 tablet by mouth once daily     Cardiovascular: Calcium Channel Blockers 2 Failed - 06/21/2023 10:27 AM      Failed - Last BP in normal range    BP Readings from Last 1 Encounters:  07/15/22 (!) 148/80         Failed - Valid encounter within last 6 months    Recent Outpatient Visits   None            Passed - Last Heart Rate in normal range    Pulse Readings from Last 1 Encounters:  07/15/22 75

## 2023-09-09 ENCOUNTER — Other Ambulatory Visit: Payer: Self-pay | Admitting: Family Medicine

## 2023-09-12 NOTE — Telephone Encounter (Signed)
 Requested medication (s) are due for refill today: routing for review  Requested medication (s) are on the active medication list: yes  Last refill:  05/27/22  Future visit scheduled: no  Notes to clinic:  Medication not assigned to a protocol, review manually.      Requested Prescriptions  Pending Prescriptions Disp Refills   clotrimazole -betamethasone  (LOTRISONE ) cream [Pharmacy Med Name: Clotrimazole -Betamethasone  1-0.05 % External Cream] 30 g 0    Sig: APPLY TOPICALLY TWICE DAILY     Off-Protocol Failed - 09/12/2023  2:55 PM      Failed - Medication not assigned to a protocol, review manually.      Failed - Valid encounter within last 12 months    Recent Outpatient Visits   None

## 2023-11-15 DIAGNOSIS — M79672 Pain in left foot: Secondary | ICD-10-CM | POA: Diagnosis not present

## 2023-11-15 DIAGNOSIS — I10 Essential (primary) hypertension: Secondary | ICD-10-CM | POA: Diagnosis not present

## 2023-11-24 DIAGNOSIS — M205X2 Other deformities of toe(s) (acquired), left foot: Secondary | ICD-10-CM | POA: Diagnosis not present

## 2023-11-24 DIAGNOSIS — B351 Tinea unguium: Secondary | ICD-10-CM | POA: Diagnosis not present

## 2023-12-13 ENCOUNTER — Emergency Department

## 2023-12-13 ENCOUNTER — Telehealth (HOSPITAL_COMMUNITY): Payer: Self-pay

## 2023-12-13 ENCOUNTER — Other Ambulatory Visit: Payer: Self-pay

## 2023-12-13 ENCOUNTER — Inpatient Hospital Stay (HOSPITAL_BASED_OUTPATIENT_CLINIC_OR_DEPARTMENT_OTHER)
Admit: 2023-12-13 | Discharge: 2023-12-13 | Disposition: A | Discharge status: Home / Self Care | Attending: Family Medicine | Admitting: Family Medicine

## 2023-12-13 ENCOUNTER — Other Ambulatory Visit (HOSPITAL_COMMUNITY): Payer: Self-pay

## 2023-12-13 ENCOUNTER — Observation Stay
Admission: EM | Admit: 2023-12-13 | Discharge: 2023-12-13 | Disposition: A | Discharge status: Home / Self Care | Attending: Internal Medicine | Admitting: Internal Medicine

## 2023-12-13 DIAGNOSIS — I4891 Unspecified atrial fibrillation: Secondary | ICD-10-CM | POA: Diagnosis not present

## 2023-12-13 DIAGNOSIS — Z79899 Other long term (current) drug therapy: Secondary | ICD-10-CM | POA: Insufficient documentation

## 2023-12-13 DIAGNOSIS — K219 Gastro-esophageal reflux disease without esophagitis: Secondary | ICD-10-CM | POA: Insufficient documentation

## 2023-12-13 DIAGNOSIS — I10 Essential (primary) hypertension: Secondary | ICD-10-CM | POA: Diagnosis not present

## 2023-12-13 DIAGNOSIS — E876 Hypokalemia: Secondary | ICD-10-CM | POA: Insufficient documentation

## 2023-12-13 DIAGNOSIS — J9811 Atelectasis: Secondary | ICD-10-CM | POA: Diagnosis not present

## 2023-12-13 DIAGNOSIS — F1090 Alcohol use, unspecified, uncomplicated: Secondary | ICD-10-CM | POA: Insufficient documentation

## 2023-12-13 DIAGNOSIS — R0989 Other specified symptoms and signs involving the circulatory and respiratory systems: Secondary | ICD-10-CM | POA: Diagnosis not present

## 2023-12-13 DIAGNOSIS — Z7901 Long term (current) use of anticoagulants: Secondary | ICD-10-CM | POA: Insufficient documentation

## 2023-12-13 DIAGNOSIS — R079 Chest pain, unspecified: Secondary | ICD-10-CM | POA: Diagnosis not present

## 2023-12-13 DIAGNOSIS — R002 Palpitations: Secondary | ICD-10-CM | POA: Diagnosis not present

## 2023-12-13 LAB — BASIC METABOLIC PANEL WITH GFR
Anion gap: 9 (ref 5–15)
BUN: 16 mg/dL (ref 8–23)
CO2: 28 mmol/L (ref 22–32)
Calcium: 8.7 mg/dL — ABNORMAL LOW (ref 8.9–10.3)
Chloride: 102 mmol/L (ref 98–111)
Creatinine, Ser: 0.81 mg/dL (ref 0.61–1.24)
GFR, Estimated: >60 mL/min (ref >60)
Glucose, Bld: 118 mg/dL — ABNORMAL HIGH (ref 70–99)
Potassium: 2.8 mmol/L — ABNORMAL LOW (ref 3.5–5.1)
Sodium: 139 mmol/L (ref 135–145)

## 2023-12-13 LAB — ECHOCARDIOGRAM COMPLETE
AR max vel: 2.13 cm2
AV Area VTI: 2.3 cm2
AV Area mean vel: 2.12 cm2
AV Mean grad: 4 mmHg
AV Peak grad: 7.4 mmHg
Ao pk vel: 1.36 m/s
Area-P 1/2: 3.08 cm2
Calc EF: 60.5 %
Height: 67 in
MV VTI: 1.9 cm2
P 1/2 time: 523 ms
S' Lateral: 2.2 cm
Single Plane A2C EF: 65.4 %
Single Plane A4C EF: 56.8 %
Weight: 2560 [oz_av]

## 2023-12-13 LAB — URINALYSIS, ROUTINE W REFLEX MICROSCOPIC
Bilirubin Urine: NEGATIVE
Glucose, UA: NEGATIVE mg/dL
Hgb urine dipstick: NEGATIVE
Ketones, ur: NEGATIVE mg/dL
Leukocytes,Ua: NEGATIVE
Nitrite: NEGATIVE
Protein, ur: NEGATIVE mg/dL
Specific Gravity, Urine: 1.005 (ref 1.005–1.030)
pH: 7 (ref 5.0–8.0)

## 2023-12-13 LAB — D-DIMER, QUANTITATIVE: D-Dimer, Quant: 0.53 ug{FEU}/mL — ABNORMAL HIGH (ref 0.00–0.50)

## 2023-12-13 LAB — CBC
HCT: 43.3 % (ref 39.0–52.0)
Hemoglobin: 14.6 g/dL (ref 13.0–17.0)
MCH: 28.5 pg (ref 26.0–34.0)
MCHC: 33.7 g/dL (ref 30.0–36.0)
MCV: 84.6 fL (ref 80.0–100.0)
Platelets: 262 K/uL (ref 150–400)
RBC: 5.12 MIL/uL (ref 4.22–5.81)
RDW: 12.6 % (ref 11.5–15.5)
WBC: 5.6 K/uL (ref 4.0–10.5)
nRBC: 0 % (ref 0.0–0.2)

## 2023-12-13 LAB — POTASSIUM: Potassium: 3.3 mmol/L — ABNORMAL LOW (ref 3.5–5.1)

## 2023-12-13 LAB — PROTIME-INR
INR: 1 (ref 0.8–1.2)
Prothrombin Time: 14 s (ref 11.4–15.2)

## 2023-12-13 LAB — TROPONIN T, HIGH SENSITIVITY
Troponin T High Sensitivity: 44 ng/L — ABNORMAL HIGH (ref 0–19)
Troponin T High Sensitivity: 44 ng/L — ABNORMAL HIGH (ref 0–19)
Troponin T High Sensitivity: 44 ng/L — ABNORMAL HIGH (ref 0–19)

## 2023-12-13 LAB — MAGNESIUM: Magnesium: 2.1 mg/dL (ref 1.7–2.4)

## 2023-12-13 LAB — HIV ANTIBODY (ROUTINE TESTING W REFLEX): HIV Screen 4th Generation wRfx: NONREACTIVE

## 2023-12-13 LAB — TSH: TSH: 3.55 u[IU]/mL (ref 0.350–4.500)

## 2023-12-13 MED ORDER — CARVEDILOL 12.5 MG PO TABS: 12.5000 mg | ORAL_TABLET | Freq: Two times a day (BID) | ORAL | 0 refills | Status: AC

## 2023-12-13 MED ORDER — CARVEDILOL 6.25 MG PO TABS
12.5000 mg | ORAL_TABLET | Freq: Two times a day (BID) | ORAL | Status: DC
  Administered 2023-12-13: 12.5 mg via ORAL
  Filled 2023-12-13: qty 2

## 2023-12-13 MED ORDER — POTASSIUM CHLORIDE 10 MEQ/100ML IV SOLN
10.0000 meq | INTRAVENOUS | Status: AC
  Filled 2023-12-13: qty 100

## 2023-12-13 MED ORDER — POTASSIUM CHLORIDE 10 MEQ/100ML IV SOLN
10.0000 meq | INTRAVENOUS | Status: DC
  Administered 2023-12-13 (×3): 10 meq via INTRAVENOUS
  Filled 2023-12-13 (×3): qty 100

## 2023-12-13 MED ORDER — ONDANSETRON HCL 4 MG/2ML IJ SOLN: 4.0000 mg | Freq: Four times a day (QID) | INTRAMUSCULAR | Status: DC | PRN

## 2023-12-13 MED ORDER — PANTOPRAZOLE SODIUM 40 MG PO TBEC
40.0000 mg | DELAYED_RELEASE_TABLET | Freq: Every day | ORAL | Status: DC
  Administered 2023-12-13: 40 mg via ORAL
  Filled 2023-12-13: qty 1

## 2023-12-13 MED ORDER — CLOTRIMAZOLE 1 % EX CREA
TOPICAL_CREAM | Freq: Two times a day (BID) | CUTANEOUS | Status: DC
  Filled 2023-12-13: qty 15

## 2023-12-13 MED ORDER — APIXABAN 5 MG PO TABS: 5.0000 mg | ORAL_TABLET | Freq: Two times a day (BID) | ORAL | Status: DC

## 2023-12-13 MED ORDER — ALPRAZOLAM 0.25 MG PO TABS: 0.2500 mg | ORAL_TABLET | Freq: Two times a day (BID) | ORAL | Status: DC | PRN

## 2023-12-13 MED ORDER — TRAZODONE HCL 50 MG PO TABS: 25.0000 mg | ORAL_TABLET | Freq: Every evening | ORAL | Status: DC | PRN

## 2023-12-13 MED ORDER — POTASSIUM CHLORIDE CRYS ER 20 MEQ PO TBCR
40.0000 meq | EXTENDED_RELEASE_TABLET | Freq: Once | ORAL | Status: AC
  Administered 2023-12-13: 40 meq via ORAL
  Filled 2023-12-13: qty 2

## 2023-12-13 MED ORDER — POTASSIUM CHLORIDE 20 MEQ PO PACK
40.0000 meq | PACK | Freq: Once | ORAL | Status: AC
  Administered 2023-12-13: 40 meq via ORAL
  Filled 2023-12-13: qty 2

## 2023-12-13 MED ORDER — OLMESARTAN MEDOXOMIL 40 MG PO TABS: 40.0000 mg | ORAL_TABLET | Freq: Every day | ORAL | 0 refills | Status: AC

## 2023-12-13 MED ORDER — BENAZEPRIL HCL 20 MG PO TABS
40.0000 mg | ORAL_TABLET | Freq: Every day | ORAL | Status: DC
  Administered 2023-12-13: 40 mg via ORAL
  Filled 2023-12-13: qty 2

## 2023-12-13 MED ORDER — FLUTICASONE PROPIONATE 50 MCG/ACT NA SUSP: 2.0000 | Freq: Every day | NASAL | Status: DC | PRN

## 2023-12-13 MED ORDER — SODIUM CHLORIDE 0.9 % IV SOLN: INTRAVENOUS | Status: DC

## 2023-12-13 MED ORDER — ACETAMINOPHEN 325 MG PO TABS: 650.0000 mg | ORAL_TABLET | ORAL | Status: DC | PRN

## 2023-12-13 MED ORDER — POTASSIUM CHLORIDE 20 MEQ PO PACK: 40.0000 meq | PACK | Freq: Once | ORAL | Status: DC

## 2023-12-13 MED ORDER — POTASSIUM CHLORIDE CRYS ER 20 MEQ PO TBCR
20.0000 meq | EXTENDED_RELEASE_TABLET | Freq: Every day | ORAL | Status: DC
  Administered 2023-12-13: 20 meq via ORAL
  Filled 2023-12-13: qty 1

## 2023-12-13 MED ORDER — MAGNESIUM HYDROXIDE 400 MG/5ML PO SUSP: 30.0000 mL | Freq: Every day | ORAL | Status: DC | PRN

## 2023-12-13 MED ORDER — APIXABAN 5 MG PO TABS: 5.0000 mg | ORAL_TABLET | Freq: Two times a day (BID) | ORAL | 0 refills | Status: AC

## 2023-12-13 MED ORDER — AMLODIPINE BESYLATE 5 MG PO TABS: 5.0000 mg | ORAL_TABLET | Freq: Every day | ORAL | Status: DC

## 2023-12-13 MED ORDER — ENOXAPARIN SODIUM 80 MG/0.8ML IJ SOSY
70.0000 mg | PREFILLED_SYRINGE | Freq: Two times a day (BID) | INTRAMUSCULAR | Status: DC
  Administered 2023-12-13: 70 mg via SUBCUTANEOUS
  Filled 2023-12-13 (×2): qty 0.7

## 2023-12-13 MED ORDER — POTASSIUM CHLORIDE CRYS ER 20 MEQ PO TBCR: 20.0000 meq | EXTENDED_RELEASE_TABLET | Freq: Every day | ORAL | 0 refills | Status: DC

## 2023-12-13 MED ORDER — SODIUM CHLORIDE 0.9 % IV BOLUS (SEPSIS)
1000.0000 mL | Freq: Once | INTRAVENOUS | Status: AC
  Administered 2023-12-13: 1000 mL via INTRAVENOUS

## 2023-12-13 MED ORDER — DILTIAZEM HCL-DEXTROSE 125-5 MG/125ML-% IV SOLN (PREMIX)
5.0000 mg/h | INTRAVENOUS | Status: DC
  Administered 2023-12-13: 5 mg/h via INTRAVENOUS
  Filled 2023-12-13: qty 125

## 2023-12-13 MED ORDER — HYDROCHLOROTHIAZIDE 25 MG PO TABS: 25.0000 mg | ORAL_TABLET | Freq: Every day | ORAL | Status: DC

## 2023-12-13 NOTE — ED Provider Notes (Addendum)
 [facility] Provider Note    Event Date/Time   First MD Initiated Contact with Patient 12/13/23 0249     (approximate)   History   Palpitations   HPI  Shaun Carney is a 63 y.o. male with history of hypertension on carvedilol , benazepril  and HCTZ who presents to the emergency department with palpitations that woke him from sleep.  He denies any associated chest pain, shortness of breath or dizziness.  No recent fevers, cough, vomiting, diarrhea.  Wife reports he does drink caffeinated Pepsi multiple times a day but no change in caffeine intake, stimulant use or illicit drug use.  Reports occasional alcohol use.  No prior history of atrial fibrillation or other arrhythmia.  Does not have a cardiologist.  Denies history of PE or DVT.  No calf tenderness or calf swelling.  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  States he took an extra dose of his Coreg  6.25 mg, benazepril /HCTZ prior to arrival.   History provided by patient, wife.    Past Medical History:  Diagnosis Date   Anxiety    COVID-19 05/24/2018   GERD (gastroesophageal reflux disease)    Hypertension     Past Surgical History:  Procedure Laterality Date   CATARACT EXTRACTION W/PHACO Left 09/29/2016   Procedure: CATARACT EXTRACTION PHACO AND INTRAOCULAR LENS PLACEMENT (IOC);  Surgeon: Myrna Adine Anes, MD;  Location: [facility] ORS;  Service: Ophthalmology;  Laterality: Left;  Lot # [id number] H US :00:40.4 AP%: 7.0 CDE: 2.87   COLONOSCOPY WITH PROPOFOL  N/A 04/03/2020   Procedure: COLONOSCOPY WITH BIOPSY;  Surgeon: Jinny Carmine, MD;  Location: [facility];  Service: Endoscopy;  Laterality: N/A;  leave first case   ESOPHAGOGASTRODUODENOSCOPY (EGD) WITH PROPOFOL  N/A 04/03/2020   Procedure: ESOPHAGOGASTRODUODENOSCOPY (EGD) WITH PROPOFOL ;  Surgeon: Jinny Carmine, MD;  Location: [facility];  Service: Endoscopy;  Laterality: N/A;   EYE SURGERY     INSERTION OF AHMED VALVE Left 05/18/2015    Procedure: INSERTION OF AHMED VALVE AND SCLERAL PATCH GRAFT TO LEFT EYE;  Surgeon: Donzell Arlyce Budd, MD;  Location: [facility];  Service: Ophthalmology;  Laterality: Left;  1ST CASE PER DR VIN   POLYPECTOMY N/A 04/03/2020   Procedure: POLYPECTOMY;  Surgeon: Jinny Carmine, MD;  Location: [facility];  Service: Endoscopy;  Laterality: N/A;    MEDICATIONS:  Prior to Admission medications   Medication Sig Start Date End Date Taking? Authorizing Provider  amLODipine  (NORVASC ) 5 MG tablet Take 1 tablet by mouth once daily 06/12/23   Johnson, Megan P, DO  benazepril  (LOTENSIN ) 40 MG tablet Take 1 tablet by mouth once daily 03/15/22   Johnson, Megan P, DO  clotrimazole -betamethasone  (LOTRISONE ) cream APPLY TOPICALLY TWICE DAILY 09/12/23   Johnson, Megan P, DO  fluticasone  (FLONASE ) 50 MCG/ACT nasal spray SHAKE LIQUID AND USE 2 SPRAYS IN EACH NOSTRIL EVERY DAY 05/12/18   Montell Anes LABOR, MD  hydrochlorothiazide  (HYDRODIURIL ) 25 MG tablet Take 1 tablet (25 mg total) by mouth daily. 05/27/22   Vicci Bouchard P, DO  pantoprazole  (PROTONIX ) 40 MG tablet Take 1 tablet by mouth once daily 04/12/23   Vicci, Megan P, DO  potassium chloride  SA (KLOR-CON  M) 20 MEQ tablet Take 1 tablet (20 mEq total) by mouth daily. 06/01/22   Johnson, Megan P, DO  sildenafil  (REVATIO ) 20 MG tablet Take 1 tablet (20 mg total) by mouth daily as needed. 05/10/21   Johnson, Megan P, DO  tadalafil  (CIALIS ) 20 MG tablet Take 1 tab 1 hour  prior to intercourse 07/15/22   Twylla Glendia BROCKS, MD    Physical Exam   Triage Vital Signs: ED Triage Vitals  Encounter Vitals Group     BP 12/13/23 0247 124/87     Girls Systolic BP Percentile --      Girls Diastolic BP Percentile --      Boys Systolic BP Percentile --      Boys Diastolic BP Percentile --      Pulse Rate 12/13/23 0247 (!) 151     Resp 12/13/23 0247 20     Temp 12/13/23 0247 98 F (36.7 C)     Temp src --      SpO2 12/13/23 0247 97 %     Weight 12/13/23 0245  160 lb (72.6 kg)     Height 12/13/23 0245 5' 7 (1.702 m)     Head Circumference --      Peak Flow --      Pain Score 12/13/23 0245 0     Pain Loc --      Pain Education --      Exclude from Growth Chart --     Most recent vital signs: Vitals:   12/13/23 0400 12/13/23 0420  BP: (!) 149/96   Pulse: 68 89  Resp: 18 20  Temp:    SpO2: 96% 96%    CONSTITUTIONAL: Alert, responds appropriately to questions. Well-appearing; well-nourished HEAD: Normocephalic, atraumatic EYES: Conjunctivae clear, pupils appear equal, sclera nonicteric ENT: normal nose; moist mucous membranes NECK: Supple, normal ROM CARD: Irregularly irregular and tachycardic; S1 and S2 appreciated RESP: Normal chest excursion without splinting or tachypnea; breath sounds clear and equal bilaterally; no wheezes, no rhonchi, no rales, no hypoxia or respiratory distress, speaking full sentences ABD/GI: Non-distended; soft, non-tender, no rebound, no guarding, no peritoneal signs BACK: The back appears normal EXT: Normal ROM in all joints; no deformity noted, no edema, no calf tenderness or calf swelling SKIN: Normal color for age and race; warm; no rash on exposed skin NEURO: Moves all extremities equally, normal speech PSYCH: The patient's mood and manner are appropriate.   ED Results / Procedures / Treatments   LABS: (all labs ordered are listed, but only abnormal results are displayed) Labs Reviewed  URINALYSIS, ROUTINE W REFLEX MICROSCOPIC - Abnormal; Notable for the following components:      Result Value   Color, Urine COLORLESS (*)    APPearance CLEAR (*)    All other components within normal limits  D-DIMER, QUANTITATIVE - Abnormal; Notable for the following components:   D-Dimer, Quant 0.53 (*)    All other components within normal limits  BASIC METABOLIC PANEL WITH GFR - Abnormal; Notable for the following components:   Potassium 2.8 (*)    Glucose, Bld 118 (*)    Calcium 8.7 (*)    All other  components within normal limits  TROPONIN T, HIGH SENSITIVITY - Abnormal; Notable for the following components:   Troponin T High Sensitivity 44 (*)    All other components within normal limits  CBC  MAGNESIUM  TSH     EKG:  EKG Interpretation Date/Time:  Wednesday December 13 2023 02:45:27 EST Ventricular Rate:  115 PR Interval:    QRS Duration:  88 QT Interval:  290 QTC Calculation: 401 R Axis:   2  Text Interpretation: Atrial fibrillation with rapid ventricular response Moderate voltage criteria for LVH, may be normal variant ( R in aVL , Cornell product ) Possible Anterior infarct , age undetermined ST &  T wave abnormality, consider inferior ischemia Abnormal ECG When compared with ECG of 04-Aug-2017 04:14, PREVIOUS ECG IS PRESENT Confirmed by Neomi Neptune [phone]) on 12/13/2023 2:49:34 AM         RADIOLOGY: My personal review and interpretation of imaging: Chest x-ray clear.  I have personally reviewed all radiology reports.   DG Chest Port 1 View Result Date: 12/13/2023 EXAM: 1 VIEW XRAY OF THE CHEST 12/13/2023 03:12:00 AM COMPARISON: None available. CLINICAL HISTORY: Chest pain FINDINGS: LUNGS AND PLEURA: Low lung volumes with bronchovascular crowding. Left basilar atelectasis. No pleural effusion. No pneumothorax. HEART AND MEDIASTINUM: No acute abnormality of the cardiac and mediastinal silhouettes. BONES AND SOFT TISSUES: No acute osseous abnormality. IMPRESSION: 1. Low lung volumes with bronchovascular crowding and left basilar atelectasis. Electronically signed by: Dorethia Molt MD 12/13/2023 03:57 AM EST RP Workstation: [id number]     PROCEDURES:  Critical Care performed: Yes, see critical care procedure note(s)   CRITICAL CARE Performed by: Neptune Neomi   Total critical care time: 30 minutes  Critical care time was exclusive of separately billable procedures and treating other patients.  Critical care was necessary to treat or prevent imminent or  life-threatening deterioration.  Critical care was time spent personally by me on the following activities: development of treatment plan with patient and/or surrogate as well as nursing, discussions with consultants, evaluation of patient's response to treatment, examination of patient, obtaining history from patient or surrogate, ordering and performing treatments and interventions, ordering and review of laboratory studies, ordering and review of radiographic studies, pulse oximetry and re-evaluation of patient's condition.   SABRA1-3 Lead EKG Interpretation  Performed by: Aldan Camey, Neptune SAILOR, DO Authorized by: Jomaira Darr, Neptune SAILOR, DO     Interpretation: abnormal     ECG rate:  124   ECG rate assessment: tachycardic     Rhythm: atrial fibrillation     Ectopy: none     Conduction: normal       IMPRESSION / MDM / ASSESSMENT AND PLAN / ED COURSE  I reviewed the triage vital signs and the nursing notes.    Patient here with palpitations with A-fib with RVR.  The patient is on the cardiac monitor to evaluate for evidence of arrhythmia and/or significant heart rate changes.   DIFFERENTIAL DIAGNOSIS (includes but not limited to):   Atrial fibrillation, anemia, electrolyte derangement, thyroid  dysfunction, PE, ACS, doubt sepsis   Patient's presentation is most consistent with acute presentation with potential threat to life or bodily function.   PLAN: Will obtain labs, chest x-ray, urine.  Will check D-dimer.  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  Will give IV fluids and start diltiazem.    MEDICATIONS GIVEN IN ED: Medications  diltiazem (CARDIZEM) 125 mg in dextrose 5% 125 mL (1 mg/mL) infusion (5 mg/hr Intravenous New Bag/Given 12/13/23 0349)  potassium chloride  10 mEq in 100 mL IVPB (has no administration in time range)  potassium chloride  (KLOR-CON ) packet 40 mEq (has no administration in time range)  sodium chloride  0.9 % bolus 1,000 mL (1,000 mLs Intravenous New  Bag/Given 12/13/23 0312)     ED COURSE: Patient's labs show normal hemoglobin.  Potassium of 2.8.  Will give oral and IV replacement.  Normal magnesium level.  Normal TSH.  Troponin elevated likely from demand ischemia.  Chest x-ray reviewed and interpreted by myself and radiologist and shows no acute abnormality.  Patient on diltiazem infusion.  Heart rate improving.  Age-adjusted D-dimer negative.  Will admit  to the hospitalist service.   CONSULTS:  4:27 AM  Consulted and discussed patient's case with hospitalist, Dr. Lawence.  I have recommended admission and consulting physician agrees and will place admission orders.  Patient (and family if present) agree with this plan.   I reviewed all nursing notes, vitals, pertinent previous records.  All labs, EKGs, imaging ordered have been independently reviewed and interpreted by myself.    OUTSIDE RECORDS REVIEWED: Reviewed most recent internal medicine notes.       FINAL CLINICAL IMPRESSION(S) / ED DIAGNOSES   Final diagnoses:  Atrial fibrillation with RVR (HCC)  Hypokalemia     Rx / DC Orders   ED Discharge Orders     None        Note:  This document was prepared using Dragon voice recognition software and may include unintentional dictation errors.   Eshaal Duby, Josette SAILOR, DO 12/13/23 0421    Arthi Mcdonald, Josette SAILOR, DO 12/13/23 [phone]

## 2023-12-13 NOTE — Telephone Encounter (Signed)
 Pharmacy Patient Advocate Encounter  Insurance verification completed.    The patient is insured through [facility]. Patient has [employer], may use a copay card, and/or apply for patient assistance if available.    Ran test claim for Eliquis 5mg  and the current 30 day co-pay is $30.   This test claim was processed through [facility]- copay amounts may vary at other pharmacies due to [employer], or as the patient moves through the different stages of their insurance plan.

## 2023-12-13 NOTE — Assessment & Plan Note (Addendum)
-

## 2023-12-13 NOTE — H&P (Signed)
 [place]   PATIENT NAME: Shaun Carney    MR#:  [id number]  DATE OF BIRTH:  1960/01/28  DATE OF ADMISSION:  12/13/2023  PRIMARY CARE PHYSICIAN: Lenon Layman ORN, MD   Patient is coming from: Home  REQUESTING/REFERRING PHYSICIAN: Ward, Josette SAILOR, DO  CHIEF COMPLAINT:   Chief Complaint  Patient presents with   Palpitations    HISTORY OF PRESENT ILLNESS:  Shaun Carney is a 63 y.o. African-American male with medical history significant for essential hypertension, GERD, and anxiety, who presented to the emergency room with acute onset of palpitations that woke him up from sleep.  He denied any chest pain or dyspnea or cough or wheezing or hemoptysis.  Denied any headache or dizziness or blurred vision.  No fever or chills.  No nausea or vomiting or abdominal pain.  No melena or bright red bleeding per rectum.  No other bleeding diathesis.  Wife stated that he has been drinking caffeinated Pepsi multiple times during the day.  He denies any history of arrhythmias.  No leg pain or edema or recent travels or surgeries.  No history of thromboembolism.  He works as a naval architect.  ED Course: When he came to the ER, BP was 124/87 with heart rate 151 with otherwise normal vital signs.  Labs revealed hypokalemia of 2.8 with calcium of 8.7 and otherwise normal BMP.  Magnesium was 2.1 and high-sensitivity troponin was 44 with normal CBC.  D-dimer was 0.53 and TSH 3.55.  UA was negative. EKG as reviewed by me : EKG showed atrial fibrillation with RVR 115 with moderate voltage criteria for LVH and Q waves in V1 and lead III with T wave inversion inferiorly.. Imaging: Portable chest x-ray showed low lung volumes with bronchovascular crowding and left basal atelectasis  The patient was given 1 L bolus of IV normal saline and 40 mEq p.o. potassium chloride  and was placed on IV Cardizem drip with significant improvement of heart rate to 89 and later 71.SABRA  He will be admitted to a progressive  unit bed for further evaluation and management. PAST MEDICAL HISTORY:   Past Medical History:  Diagnosis Date   Anxiety    COVID-19 05/24/2018   GERD (gastroesophageal reflux disease)    Hypertension     PAST SURGICAL HISTORY:   Past Surgical History:  Procedure Laterality Date   CATARACT EXTRACTION W/PHACO Left 09/29/2016   Procedure: CATARACT EXTRACTION PHACO AND INTRAOCULAR LENS PLACEMENT (IOC);  Surgeon: Myrna Adine Anes, MD;  Location: [facility];  Service: Ophthalmology;  Laterality: Left;  Lot # [id number] H US :00:40.4 AP%: 7.0 CDE: 2.87   COLONOSCOPY WITH PROPOFOL  N/A 04/03/2020   Procedure: COLONOSCOPY WITH BIOPSY;  Surgeon: Jinny Carmine, MD;  Location: [facility];  Service: Endoscopy;  Laterality: N/A;  leave first case   ESOPHAGOGASTRODUODENOSCOPY (EGD) WITH PROPOFOL  N/A 04/03/2020   Procedure: ESOPHAGOGASTRODUODENOSCOPY (EGD) WITH PROPOFOL ;  Surgeon: Jinny Carmine, MD;  Location: [facility];  Service: Endoscopy;  Laterality: N/A;   EYE SURGERY     INSERTION OF AHMED VALVE Left 05/18/2015   Procedure: INSERTION OF AHMED VALVE AND SCLERAL PATCH GRAFT TO LEFT EYE;  Surgeon: Donzell Arlyce Budd, MD;  Location: [facility];  Service: Ophthalmology;  Laterality: Left;  1ST CASE PER DR VIN   POLYPECTOMY N/A 04/03/2020   Procedure: POLYPECTOMY;  Surgeon: Jinny Carmine, MD;  Location: [facility];  Service: Endoscopy;  Laterality: N/A;    SOCIAL HISTORY:   Social History  Tobacco Use   Smoking status: Never   Smokeless tobacco: Never  Substance Use Topics   Alcohol use: Yes    Alcohol/week: 6.0 standard drinks of alcohol    Types: 6 Cans of beer per week    Comment: weekends    FAMILY HISTORY:   Family History  Problem Relation Age of Onset   Cancer Mother        throat   Hypertension Mother    Heart attack Mother    Cancer Father        lung    DRUG ALLERGIES:  No Known Allergies  REVIEW OF SYSTEMS:   ROS As per history of  present illness. All pertinent systems were reviewed above. Constitutional, HEENT, cardiovascular, respiratory, GI, GU, musculoskeletal, neuro, psychiatric, endocrine, integumentary and hematologic systems were reviewed and are otherwise negative/unremarkable except for positive findings mentioned above in the HPI.   MEDICATIONS AT HOME:   Prior to Admission medications   Medication Sig Start Date End Date Taking? Authorizing Provider  amLODipine  (NORVASC ) 5 MG tablet Take 1 tablet by mouth once daily 06/12/23   Johnson, Megan P, DO  benazepril  (LOTENSIN ) 40 MG tablet Take 1 tablet by mouth once daily 03/15/22   Johnson, Megan P, DO  clotrimazole -betamethasone  (LOTRISONE ) cream APPLY TOPICALLY TWICE DAILY 09/12/23   Johnson, Megan P, DO  fluticasone  (FLONASE ) 50 MCG/ACT nasal spray SHAKE LIQUID AND USE 2 SPRAYS IN EACH NOSTRIL EVERY DAY 05/12/18   Montell Oneil LABOR, MD  hydrochlorothiazide  (HYDRODIURIL ) 25 MG tablet Take 1 tablet (25 mg total) by mouth daily. 05/27/22   Vicci Bouchard P, DO  pantoprazole  (PROTONIX ) 40 MG tablet Take 1 tablet by mouth once daily 04/12/23   Vicci, Megan P, DO  potassium chloride  SA (KLOR-CON  M) 20 MEQ tablet Take 1 tablet (20 mEq total) by mouth daily. 06/01/22   Johnson, Megan P, DO  sildenafil  (REVATIO ) 20 MG tablet Take 1 tablet (20 mg total) by mouth daily as needed. 05/10/21   Johnson, Megan P, DO  tadalafil  (CIALIS ) 20 MG tablet Take 1 tab 1 hour prior to intercourse 07/15/22   Stoioff, Glendia BROCKS, MD      VITAL SIGNS:  Blood pressure (!) 149/96, pulse 89, temperature 98 F (36.7 C), resp. rate 20, height 5' 7 (1.702 m), weight 72.6 kg, SpO2 96%.  PHYSICAL EXAMINATION:  Physical Exam  GENERAL:  63 y.o.-year-old patient lying in the bed with no acute distress.  EYES: Pupils equal, round, reactive to light and accommodation. No scleral icterus. Extraocular muscles intact.  HEENT: Head atraumatic, normocephalic. Oropharynx and nasopharynx clear.  NECK:  Supple, no  jugular venous distention. No thyroid  enlargement, no tenderness.  LUNGS: Normal breath sounds bilaterally, no wheezing, rales,rhonchi or crepitation. No use of accessory muscles of respiration.  CARDIOVASCULAR: Irregularly irregular  rhythm, S1, S2 normal. No murmurs, rubs, or gallops.  ABDOMEN: Soft, nondistended, nontender. Bowel sounds present. No organomegaly or mass.  EXTREMITIES: No pedal edema, cyanosis, or clubbing.  NEUROLOGIC: Cranial nerves II through XII are intact. Muscle strength 5/5 in all extremities. Sensation intact. Gait not checked.  PSYCHIATRIC: The patient is alert and oriented x 3.  Normal affect and good eye contact. SKIN: No obvious rash, lesion, or ulcer.   LABORATORY PANEL:   CBC Recent Labs  Lab 12/13/23 0336  WBC 5.6  HGB 14.6  HCT 43.3  PLT 262   ------------------------------------------------------------------------------------------------------------------  Chemistries  Recent Labs  Lab 12/13/23 0336  NA 139  K 2.8*  CL 102  CO2 28  GLUCOSE 118*  BUN 16  CREATININE 0.81  CALCIUM 8.7*  MG 2.1   ------------------------------------------------------------------------------------------------------------------  Cardiac Enzymes No results for input(s): TROPONINI in the last 168 hours. ------------------------------------------------------------------------------------------------------------------  RADIOLOGY:  DG Chest Port 1 View Result Date: 12/13/2023 EXAM: 1 VIEW XRAY OF THE CHEST 12/13/2023 03:12:00 AM COMPARISON: None available. CLINICAL HISTORY: Chest pain FINDINGS: LUNGS AND PLEURA: Low lung volumes with bronchovascular crowding. Left basilar atelectasis. No pleural effusion. No pneumothorax. HEART AND MEDIASTINUM: No acute abnormality of the cardiac and mediastinal silhouettes. BONES AND SOFT TISSUES: No acute osseous abnormality. IMPRESSION: 1. Low lung volumes with bronchovascular crowding and left basilar atelectasis.  Electronically signed by: Dorethia Molt MD 12/13/2023 03:57 AM EST RP Workstation: [id number]      IMPRESSION AND PLAN:  Assessment and Plan: * Atrial fibrillation with RVR (HCC) - The patient will be admitted to a progressive unit bed. - We will continue him on IV Cardizem drip to be tapered down if he is tachycardic. - The patient's CHADS2 VASc score is 1. - Will aggressively replace his potassium as hypokalemia could be a major contributing factor. - His TSH came back normal. - 2D echo will be obtained and cardiology consult. - I notified [facility] group about the patient.  Hypokalemia - We will aggressively replace his potassium. - Magnesium level came back normal.  GERD without esophagitis - We will continue PPI therapy.  Essential hypertension - Will continue antihypertensive therapy.   DVT prophylaxis: Lovenox. Advanced Care Planning:  Code Status: full code.  Family Communication:  The plan of care was discussed in details with the patient (and family). I answered all questions. The patient agreed to proceed with the above mentioned plan. Further management will depend upon hospital course. Disposition Plan: Back to previous home environment Consults called: Cardiology All the records are reviewed and case discussed with ED provider.  Status is: Inpatient  At the time of the admission, it appears that the appropriate admission status for this patient is inpatient.  This is judged to be reasonable and necessary in order to provide the required intensity of service to ensure the patient's safety given the presenting symptoms, physical exam findings and initial radiographic and laboratory data in the context of comorbid conditions.  The patient requires inpatient status due to high intensity of service, high risk of further deterioration and high frequency of surveillance required.  I certify that at the time of admission, it is my clinical judgment that the patient will  require inpatient hospital care extending more than 2 midnights.                            Dispo: The patient is from: Home              Anticipated d/c is to: Home              Patient currently is not medically stable to d/c.              Difficult to place patient: No  Madison DELENA Peaches M.D on 12/13/2023 at 6:20 AM  [facility]   From 7 PM-7 AM, contact night-coverage [email or web address]  CC: Primary care physician; Lenon Layman ORN, MD

## 2023-12-13 NOTE — ED Notes (Signed)
 Pt verbalizes understanding of discharge instructions. Opportunity for questioning and answers were provided. Pt discharged from ED to home with significant other.

## 2023-12-13 NOTE — Hospital Course (Signed)
 63 y.o. African-American male with medical history significant for essential hypertension, GERD, and anxiety, who presented to the emergency room with acute onset of palpitations that woke him up from sleep.  He denied any chest pain or dyspnea or cough or wheezing or hemoptysis.  Denied any headache or dizziness or blurred vision.  No fever or chills.  No nausea or vomiting or abdominal pain.  No melena or bright red bleeding per rectum.  No other bleeding diathesis.  Wife stated that he has been drinking caffeinated Pepsi multiple times during the day.  He denies any history of arrhythmias.  No leg pain or edema or recent travels or surgeries.  No history of thromboembolism.  He works as a naval architect.   ED Course: When he came to the ER, BP was 124/87 with heart rate 151 with otherwise normal vital signs.  Labs revealed hypokalemia of 2.8 with calcium of 8.7 and otherwise normal BMP.  Magnesium was 2.1 and high-sensitivity troponin was 44 with normal CBC.  D-dimer was 0.53 and TSH 3.55.  UA was negative. EKG as reviewed by me : EKG showed atrial fibrillation with RVR 115 with moderate voltage criteria for LVH and Q waves in V1 and lead III with T wave inversion inferiorly.. Imaging: Portable chest x-ray showed low lung volumes with bronchovascular crowding and left basal atelectasis   The patient was given 1 L bolus of IV normal saline and 40 mEq p.o. potassium chloride  and was placed on IV Cardizem drip with significant improvement of heart rate to 89 and later 71.    11/19.  Patient was put back on Coreg 12.5 mg twice a day.  Patient converted over to normal sinus rhythm.  Cardizem drip stopped.  Potassium replaced.  Patient feeling better.  Stable for discharge home with follow-up with cardiology as outpatient.

## 2023-12-13 NOTE — Discharge Summary (Signed)
 Physician Discharge Summary   Patient: Shaun Carney MRN: [id number] DOB: August 04, 1960  Admit date:     12/13/2023  Discharge date: 12/13/23  Discharge Physician: Charlie Patterson   PCP: Lenon Layman ORN, MD   Recommendations at discharge:   Follow-up PCP 5 days Follow-up cardiology  Discharge Diagnoses: Principal Problem:   Atrial fibrillation with RVR (HCC) Active Problems:   Hypokalemia   Essential hypertension   GERD without esophagitis    Hospital Course: 63 y.o. African-American male with medical history significant for essential hypertension, GERD, and anxiety, who presented to the emergency room with acute onset of palpitations that woke him up from sleep.  He denied any chest pain or dyspnea or cough or wheezing or hemoptysis.  Denied any headache or dizziness or blurred vision.  No fever or chills.  No nausea or vomiting or abdominal pain.  No melena or bright red bleeding per rectum.  No other bleeding diathesis.  Wife stated that he has been drinking caffeinated Pepsi multiple times during the day.  He denies any history of arrhythmias.  No leg pain or edema or recent travels or surgeries.  No history of thromboembolism.  He works as a naval architect.   ED Course: When he came to the ER, BP was 124/87 with heart rate 151 with otherwise normal vital signs.  Labs revealed hypokalemia of 2.8 with calcium of 8.7 and otherwise normal BMP.  Magnesium was 2.1 and high-sensitivity troponin was 44 with normal CBC.  D-dimer was 0.53 and TSH 3.55.  UA was negative. EKG as reviewed by me : EKG showed atrial fibrillation with RVR 115 with moderate voltage criteria for LVH and Q waves in V1 and lead III with T wave inversion inferiorly.. Imaging: Portable chest x-ray showed low lung volumes with bronchovascular crowding and left basal atelectasis   The patient was given 1 L bolus of IV normal saline and 40 mEq p.o. potassium chloride  and was placed on IV Cardizem drip with significant  improvement of heart rate to 89 and later 71.    11/19.  Patient was put back on Coreg 12.5 mg twice a day.  Patient converted over to normal sinus rhythm.  Cardizem drip stopped.  Potassium replaced.  Patient feeling better.  Stable for discharge home with follow-up with cardiology as outpatient.  Assessment and Plan: * Atrial fibrillation with RVR (HCC) Initially patient was placed on Cardizem drip.  I restarted patient's Coreg at 12.5 mg twice a day.  Case discussed with cardiology and will convert over to Eliquis with 30-day discount card for discharge.  Continue Coreg 12.5 mg twice a day upon discharge.  Risk of blood thinner explained to patient and family.  Hypokalemia Patient received IV and oral potassium supplementation.  Potassium upon discharge 3.3 will give another potassium supplementation prior to discharge on 3 days of potassium upon going home.  GERD without esophagitis Continue Protonix   Essential hypertension Discontinue hydrochlorothiazide .  Continue Coreg and Benicar.         Consultants: Cardiology Procedures performed: None Disposition: Home Diet recommendation:  Cardiac diet DISCHARGE MEDICATION: Allergies as of 12/13/2023   No Known Allergies      Medication List     STOP taking these medications    amLODipine  5 MG tablet Commonly known as: NORVASC    benazepril  40 MG tablet Commonly known as: LOTENSIN    benazepril -hydrochlorthiazide 20-12.5 MG tablet Commonly known as: LOTENSIN  HCT   cloNIDine 0.1 MG tablet Commonly known as: CATAPRES   diclofenac  75 MG  EC tablet Commonly known as: VOLTAREN    hydrochlorothiazide  25 MG tablet Commonly known as: HYDRODIURIL    sildenafil  20 MG tablet Commonly known as: REVATIO        TAKE these medications    apixaban 5 MG Tabs tablet Commonly known as: ELIQUIS Take 1 tablet (5 mg total) by mouth 2 (two) times daily.   carvedilol 12.5 MG tablet Commonly known as: COREG Take 1 tablet (12.5 mg  total) by mouth 2 (two) times daily with a meal. What changed:  medication strength how much to take when to take this   clotrimazole -betamethasone  cream Commonly known as: LOTRISONE  APPLY TOPICALLY TWICE DAILY   fluticasone  50 MCG/ACT nasal spray Commonly known as: FLONASE  SHAKE LIQUID AND USE 2 SPRAYS IN EACH NOSTRIL EVERY DAY   olmesartan 40 MG tablet Commonly known as: BENICAR Take 1 tablet (40 mg total) by mouth daily.   pantoprazole  40 MG tablet Commonly known as: PROTONIX  Take 1 tablet by mouth once daily   potassium chloride  SA 20 MEQ tablet Commonly known as: KLOR-CON  M Take 1 tablet (20 mEq total) by mouth daily.   tadalafil  5 MG tablet Commonly known as: CIALIS  Take 5 mg by mouth daily. What changed: Another medication with the same name was removed. Continue taking this medication, and follow the directions you see here.        Follow-up Information     Lenon Layman ORN, MD Follow up in 5 day(s).   Specialty: Internal Medicine Contact information: [address] [facility] [place] [address] [phone]         Perla Evalene PARAS, MD Follow up in 1 week(s).   Specialty: Cardiology Contact information: [address] [place] [address] [phone]                Discharge Exam: Fredricka Weights   12/13/23 0245  Weight: 72.6 kg   Physical Exam HENT:     Head: Normocephalic.     Mouth/Throat:     Pharynx: No oropharyngeal exudate.  Eyes:     General: Lids are normal.     Conjunctiva/sclera: Conjunctivae normal.  Cardiovascular:     Rate and Rhythm: Regular rhythm. Bradycardia present.     Heart sounds: Normal heart sounds, S1 normal and S2 normal.  Pulmonary:     Breath sounds: No decreased breath sounds, wheezing, rhonchi or rales.  Abdominal:     Palpations: Abdomen is soft.     Tenderness: There is no abdominal tenderness.  Musculoskeletal:     Right lower leg: No swelling.      Left lower leg: No swelling.  Skin:    General: Skin is warm.     Findings: No rash.  Neurological:     Mental Status: He is alert and oriented to person, place, and time.      Condition at discharge: stable  The results of significant diagnostics from this hospitalization (including imaging, microbiology, ancillary and laboratory) are listed below for reference.   Imaging Studies: ECHOCARDIOGRAM COMPLETE Result Date: 12/13/2023    ECHOCARDIOGRAM REPORT   Patient Name:   HAIM HANSSON Date of Exam: 12/13/2023 Medical Rec #:  [id number]    Height:       67.0 in Accession #:    [id number]   Weight:       160.0 lb Date of Birth:  10/13/1960    BSA:          1.839 m Patient Age:  63 years     BP:           125/79 mmHg Patient Gender: M            HR:           53 bpm. Exam Location:  [facility] Procedure: 2D Echo, Cardiac Doppler, Color Doppler and Strain Analysis (Both            Spectral and Color Flow Doppler were utilized during procedure). Indications:     Atrial Fibrillation I48.91  History:         Patient has no prior history of Echocardiogram examinations.                  Arrythmias:Atrial Fibrillation.  Sonographer:     Ashley McNeely-Sloane Referring Phys:  [id number] MADISON A MANSY Diagnosing Phys: Evalene Lunger MD  Sonographer Comments: Global longitudinal strain was attempted. IMPRESSIONS  1. Left ventricular ejection fraction, by estimation, is 60 to 65%. The left ventricle has normal function. The left ventricle has no regional wall motion abnormalities. There is mild left ventricular hypertrophy. Left ventricular diastolic parameters are indeterminate. The average left ventricular global longitudinal strain is -18.6 %. The global longitudinal strain is normal.  2. Right ventricular systolic function is normal. The right ventricular size is normal.  3. The mitral valve is normal in structure. Mild mitral valve regurgitation. No evidence of mitral stenosis.  4. The aortic valve is tricuspid. Aortic  valve regurgitation is mild. Aortic valve sclerosis is present, with no evidence of aortic valve stenosis.  5. The inferior vena cava is normal in size with greater than 50% respiratory variability, suggesting right atrial pressure of 3 mmHg. FINDINGS  Left Ventricle: Left ventricular ejection fraction, by estimation, is 60 to 65%. The left ventricle has normal function. The left ventricle has no regional wall motion abnormalities. The average left ventricular global longitudinal strain is -18.6 %. Strain was performed and the global longitudinal strain is normal. The left ventricular internal cavity size was normal in size. There is mild left ventricular hypertrophy. Left ventricular diastolic parameters are indeterminate. Right Ventricle: The right ventricular size is normal. No increase in right ventricular wall thickness. Right ventricular systolic function is normal. Left Atrium: Left atrial size was normal in size. Right Atrium: Right atrial size was normal in size. Pericardium: There is no evidence of pericardial effusion. Mitral Valve: The mitral valve is normal in structure. Mild mitral valve regurgitation. No evidence of mitral valve stenosis. MV peak gradient, 4.9 mmHg. The mean mitral valve gradient is 2.0 mmHg. Tricuspid Valve: The tricuspid valve is normal in structure. Tricuspid valve regurgitation is not demonstrated. No evidence of tricuspid stenosis. Aortic Valve: The aortic valve is tricuspid. Aortic valve regurgitation is mild. Aortic regurgitation PHT measures 523 msec. Aortic valve sclerosis is present, with no evidence of aortic valve stenosis. Aortic valve mean gradient measures 4.0 mmHg. Aortic valve peak gradient measures 7.4 mmHg. Aortic valve area, by VTI measures 2.30 cm. Pulmonic Valve: The pulmonic valve was normal in structure. Pulmonic valve regurgitation is not visualized. No evidence of pulmonic stenosis. Aorta: The aortic root is normal in size and structure. Venous: The inferior  vena cava is normal in size with greater than 50% respiratory variability, suggesting right atrial pressure of 3 mmHg. IAS/Shunts: No atrial level shunt detected by color flow Doppler. Additional Comments: 3D was performed not requiring image post processing on an independent workstation and was indeterminate.  LEFT VENTRICLE PLAX 2D LVIDd:  4.10 cm     Diastology LVIDs:         2.20 cm     LV e' medial:   7.83 cm/s LV PW:         1.80 cm     LV E/e' medial: 13.0 LV IVS:        1.30 cm LVOT diam:     1.80 cm     2D Longitudinal Strain LV SV:         73          2D Strain GLS Avg:     -18.6 % LV SV Index:   40 LVOT Area:     2.54 cm  LV Volumes (MOD) LV vol d, MOD A2C: 44.5 ml LV vol d, MOD A4C: 63.6 ml LV vol s, MOD A2C: 15.4 ml LV vol s, MOD A4C: 27.5 ml LV SV MOD A2C:     29.1 ml LV SV MOD A4C:     63.6 ml LV SV MOD BP:      32.3 ml RIGHT VENTRICLE             IVC RV Basal diam:  3.60 cm     IVC diam: 2.10 cm RV Mid diam:    2.90 cm RV S prime:     15.80 cm/s  PULMONARY VEINS TAPSE (M-mode): 3.2 cm      A Reversal Duration: 123.00 msec                             A Reversal Velocity: 33.10 cm/s                             Diastolic Velocity:  30.50 cm/s                             S/D Velocity:        1.30                             Systolic Velocity:   39.40 cm/s LEFT ATRIUM             Index        RIGHT ATRIUM           Index LA diam:        3.80 cm 2.07 cm/m   RA Area:     13.40 cm LA Vol (A2C):   50.6 ml 27.51 ml/m  RA Volume:   30.80 ml  16.75 ml/m LA Vol (A4C):   44.9 ml 24.41 ml/m LA Biplane Vol: 48.7 ml 26.48 ml/m  AORTIC VALVE                    PULMONIC VALVE AV Area (Vmax):    2.13 cm     PV Vmax:        0.98 m/s AV Area (Vmean):   2.12 cm     PV Vmean:       67.800 cm/s AV Area (VTI):     2.30 cm     PV VTI:         0.242 m AV Vmax:           136.00 cm/s  PV Peak grad:   3.8 mmHg AV Vmean:  90.800 cm/s  PV Mean grad:   2.0 mmHg AV VTI:            0.319 m      RVOT Peak grad:  2 mmHg AV Peak Grad:      7.4 mmHg AV Mean Grad:      4.0 mmHg LVOT Vmax:         114.00 cm/s LVOT Vmean:        75.500 cm/s LVOT VTI:          0.288 m LVOT/AV VTI ratio: 0.90 AI PHT:            523 msec  AORTA Ao Root diam: 3.10 cm Ao Asc diam:  3.10 cm MITRAL VALVE MV Area (PHT): 3.08 cm     SHUNTS MV Area VTI:   1.90 cm     Systemic VTI:  0.29 m MV Peak grad:  4.9 mmHg     Systemic Diam: 1.80 cm MV Mean grad:  2.0 mmHg     Pulmonic VTI:  0.153 m MV Vmax:       1.11 m/s MV Vmean:      62.7 cm/s MV Decel Time: 246 msec MV E velocity: 102.00 cm/s MV A velocity: 93.30 cm/s MV E/A ratio:  1.09 Evalene Lunger MD Electronically signed by Evalene Lunger MD Signature Date/Time: 12/13/2023/4:31:45 PM    Final    DG Chest Port 1 View Result Date: 12/13/2023 EXAM: 1 VIEW XRAY OF THE CHEST 12/13/2023 03:12:00 AM COMPARISON: None available. CLINICAL HISTORY: Chest pain FINDINGS: LUNGS AND PLEURA: Low lung volumes with bronchovascular crowding. Left basilar atelectasis. No pleural effusion. No pneumothorax. HEART AND MEDIASTINUM: No acute abnormality of the cardiac and mediastinal silhouettes. BONES AND SOFT TISSUES: No acute osseous abnormality. IMPRESSION: 1. Low lung volumes with bronchovascular crowding and left basilar atelectasis. Electronically signed by: Dorethia Molt MD 12/13/2023 03:57 AM EST RP Workstation: [id number]    Microbiology: Results for orders placed or performed in visit on 05/27/22  Microscopic Examination     Status: None   Collection Time: 05/27/22  4:50 PM   Urine  Result Value Ref Range Status   WBC, UA 0-5 0 - 5 /hpf Final   RBC, Urine None seen 0 - 2 /hpf Final   Epithelial Cells (non renal) 0-10 0 - 10 /hpf Final   Bacteria, UA None seen None seen/Few Final    Labs: CBC: Recent Labs  Lab 12/13/23 0336  WBC 5.6  HGB 14.6  HCT 43.3  MCV 84.6  PLT 262   Basic Metabolic Panel: Recent Labs  Lab 12/13/23 0336 12/13/23 1302  NA 139  --   K 2.8* 3.3*  CL 102  --    CO2 28  --   GLUCOSE 118*  --   BUN 16  --   CREATININE 0.81  --   CALCIUM 8.7*  --   MG 2.1  --    Liver Function Tests: No results for input(s): AST, ALT, ALKPHOS, BILITOT, PROT, ALBUMIN in the last 168 hours. CBG: No results for input(s): GLUCAP in the last 168 hours.  Discharge time spent: greater than 30 minutes.  Signed: Charlie Patterson, MD [facility] 12/13/2023

## 2023-12-13 NOTE — Assessment & Plan Note (Addendum)
 Initially patient was placed on Cardizem  drip.  I restarted patient's Coreg  at 12.5 mg twice a day.  Case discussed with cardiology and will convert over to Eliquis  with 30-day discount card for discharge.  Continue Coreg  12.5 mg twice a day upon discharge.  Risk of blood thinner explained to patient and family.

## 2023-12-13 NOTE — Assessment & Plan Note (Addendum)
 Patient received IV and oral potassium supplementation.  Potassium upon discharge 3.3 will give another potassium supplementation prior to discharge on 3 days of potassium upon going home.

## 2023-12-13 NOTE — ED Triage Notes (Signed)
 Pt reports he woke up tonight with palpitations checked his bp and noted it to be 140s/120s. Pt denies known cardiac hx, pt also reports inc urination in the past hour. Denies dysuria.

## 2023-12-13 NOTE — Consult Note (Signed)
 PHARMACY - ANTICOAGULATION CONSULT NOTE  Pharmacy Consult for enoxaparin Indication: atrial fibrillation  No Known Allergies  Patient Measurements: Height: 5' 7 (170.2 cm) Weight: 72.6 kg (160 lb) IBW/kg (Calculated) : 66.1 HEPARIN DW (KG): 72.6  Vital Signs: Temp: 97.9 F (36.6 C) (11/19 0630) Temp Source: Oral (11/19 0630) BP: 125/79 (11/19 0630) Pulse Rate: 74 (11/19 0630)  Labs: Recent Labs    12/13/23 0336  HGB 14.6  HCT 43.3  PLT 262  CREATININE 0.81    Estimated Creatinine Clearance: 86.1 mL/min (by C-G formula based on SCr of 0.81 mg/dL).   Medical History: Past Medical History:  Diagnosis Date   Anxiety    COVID-19 05/24/2018   GERD (gastroesophageal reflux disease)    Hypertension     Medications:  No home anticoagulants per pharmacist review  Assessment: 63 yo male presented due to palpitations.  Patient found to be in Afib. PMH includes HTN, GERD, and anxiety.   Pharmacy consulted to initiate therapeutic enoxaparin.  Hgb 14.6, plt 262.  Baseline PT/INR ordered    Goal of Therapy:  Anti-Xa level 0.6-1 units/ml 4hrs after LMWH dose given Monitor platelets by anticoagulation protocol: Yes   Plan:  Start enoxaparin 1 mg/kg (70 mg) SubQ every 12 hours Check Anti-Xa level if on enoxaparin for long duration CBC at least every 72 hours  Kayla JULIANNA Blew, PharmD, BCPS 12/13/2023,9:01 AM

## 2023-12-13 NOTE — Assessment & Plan Note (Addendum)
 Discontinue hydrochlorothiazide .  Continue Coreg and Benicar.

## 2023-12-13 NOTE — Consult Note (Addendum)
 Cardiology Consultation   Patient ID: Shaun Carney MRN: [id number]; DOB: 1960-06-01  Admit date: 12/13/2023 Date of Consult: 12/13/2023  PCP:  Lenon Layman ORN, MD    HeartCare Providers Cardiologist:  New  Patient Profile: Shaun Carney is a 63 y.o. male with a hx of hypertension, GERD, anxiety who is being seen 12/13/2023 for the evaluation of palpitations at the request of Dr. Josette.  History of Present Illness: Shaun Carney has not been seen by cardiology in the past. He drinks alcohol on the weekends. No drug or tobacco use. He does not have h/o OSA, stroke, or DM. No family history of heart disease.  The patient presented to [facility] ED 12/13/2023 with palpitations that woke him up from sleep.  He denied any chest pain, shortness of breath, or dizziness.  No recent fevers, cough, nausea, vomiting.  He does report multiple caffeinated drinks daily.  Patient reports occasional alcohol use.  Patient is a naval architect.  He took an extra dose of Coreg and benazepril /hydrochlorothiazide  prior to arrival.  In the ER blood pressure 124/87, pulse rate 151 bpm, respiratory rate 20, afebrile.  Labs showed potassium 2.8, hemoglobin 14.6, WBC 5.6, magnesium 2.1, D-dimer 0.53.  High-sensitivity troponin 44, 44.  EKG showed rapid A-fib with heart rates of 115 bpm.  Chest x-ray showed low lung volumes with bronchovascular crowding and left basilar atelectasis.  The patient was started on IV Dilt given potassium, IV fluids and admitted for further workup.   Past Medical History:  Diagnosis Date   Anxiety    COVID-19 05/24/2018   GERD (gastroesophageal reflux disease)    Hypertension     Past Surgical History:  Procedure Laterality Date   CATARACT EXTRACTION W/PHACO Left 09/29/2016   Procedure: CATARACT EXTRACTION PHACO AND INTRAOCULAR LENS PLACEMENT (IOC);  Surgeon: Myrna Adine Anes, MD;  Location: [facility] ORS;  Service: Ophthalmology;  Laterality: Left;  Lot #  [id number] H US :00:40.4 AP%: 7.0 CDE: 2.87   COLONOSCOPY WITH PROPOFOL  N/A 04/03/2020   Procedure: COLONOSCOPY WITH BIOPSY;  Surgeon: Jinny Carmine, MD;  Location: [facility];  Service: Endoscopy;  Laterality: N/A;  leave first case   ESOPHAGOGASTRODUODENOSCOPY (EGD) WITH PROPOFOL  N/A 04/03/2020   Procedure: ESOPHAGOGASTRODUODENOSCOPY (EGD) WITH PROPOFOL ;  Surgeon: Jinny Carmine, MD;  Location: [facility];  Service: Endoscopy;  Laterality: N/A;   EYE SURGERY     INSERTION OF AHMED VALVE Left 05/18/2015   Procedure: INSERTION OF AHMED VALVE AND SCLERAL PATCH GRAFT TO LEFT EYE;  Surgeon: Donzell Arlyce Budd, MD;  Location: [facility];  Service: Ophthalmology;  Laterality: Left;  1ST CASE PER DR VIN   POLYPECTOMY N/A 04/03/2020   Procedure: POLYPECTOMY;  Surgeon: Jinny Carmine, MD;  Location: [facility];  Service: Endoscopy;  Laterality: N/A;     Home Medications:  Prior to Admission medications   Medication Sig Start Date End Date Taking? Authorizing Provider  amLODipine  (NORVASC ) 5 MG tablet Take 1 tablet by mouth once daily 06/12/23  Yes Johnson, Megan P, DO  benazepril  (LOTENSIN ) 40 MG tablet Take 1 tablet by mouth once daily 03/15/22  Yes Johnson, Megan P, DO  benazepril -hydrochlorthiazide (LOTENSIN  HCT) 20-12.5 MG tablet Take 2 tablets by mouth daily. 11/03/23 11/02/24 Yes [provider]  carvedilol (COREG) 6.25 MG tablet Take 6.25 mg by mouth. 11/15/23 11/14/24 Yes [provider]  cloNIDine (CATAPRES) 0.1 MG tablet Take 0.1 mg by mouth 2 (two) times daily. 10/23/23  Yes [provider]  clotrimazole -betamethasone  (LOTRISONE ) cream APPLY TOPICALLY TWICE  DAILY 09/12/23  Yes Johnson, Megan P, DO  diclofenac  (VOLTAREN ) 75 MG EC tablet Take 75 mg by mouth 2 (two) times daily. 11/24/23  Yes [provider]  fluticasone  (FLONASE ) 50 MCG/ACT nasal spray SHAKE LIQUID AND USE 2 SPRAYS IN EACH NOSTRIL EVERY DAY 05/12/18  Yes Crissman,  Oneil LABOR, MD  hydrochlorothiazide  (HYDRODIURIL ) 25 MG tablet Take 1 tablet (25 mg total) by mouth daily. 05/27/22  Yes Johnson, Megan P, DO  pantoprazole  (PROTONIX ) 40 MG tablet Take 1 tablet by mouth once daily 04/12/23  Yes Johnson, Megan P, DO  potassium chloride  SA (KLOR-CON  M) 20 MEQ tablet Take 1 tablet (20 mEq total) by mouth daily. 06/01/22  Yes Johnson, Megan P, DO  sildenafil  (REVATIO ) 20 MG tablet Take 1 tablet (20 mg total) by mouth daily as needed. 05/10/21  Yes Johnson, Megan P, DO  tadalafil  (CIALIS ) 20 MG tablet Take 1 tab 1 hour prior to intercourse 07/15/22  Yes Stoioff, Glendia BROCKS, MD    Scheduled Meds:  amLODipine   5 mg Oral Daily   benazepril   40 mg Oral Daily   clotrimazole    Topical BID   hydrochlorothiazide   25 mg Oral Daily   pantoprazole   40 mg Oral Daily   potassium chloride   40 mEq Oral Once   potassium chloride  SA  20 mEq Oral Daily   Continuous Infusions:  sodium chloride  100 mL/hr at 12/13/23 0545   diltiazem (CARDIZEM) infusion 5 mg/hr (12/13/23 0349)   potassium chloride  10 mEq (12/13/23 0715)   PRN Meds: acetaminophen , ALPRAZolam , fluticasone , magnesium hydroxide, ondansetron  (ZOFRAN ) IV, traZODone  Allergies:   No Known Allergies  Social History:   Social History   Socioeconomic History   Marital status: Married    Spouse name: Not on file   Number of children: Not on file   Years of education: Not on file   Highest education level: Not on file  Occupational History   Not on file  Tobacco Use   Smoking status: Never   Smokeless tobacco: Never  Vaping Use   Vaping status: Never Used  Substance and Sexual Activity   Alcohol use: Yes    Alcohol/week: 6.0 standard drinks of alcohol    Types: 6 Cans of beer per week    Comment: weekends   Drug use: No   Sexual activity: Yes  Other Topics Concern   Not on file  Social History Narrative   Not on file   Social Drivers of Health   Financial Resource Strain: Low Risk  (11/24/2023)   Received from  [facility]   Overall Financial Resource Strain (CARDIA)    Difficulty of Paying Living Expenses: Not hard at all  Food Insecurity: No Food Insecurity (11/24/2023)   Received from [facility]   Hunger Vital Sign    Within the past 12 months, you worried that your food would run out before you got the money to buy more.: Never true    Within the past 12 months, the food you bought just didn't last and you didn't have money to get more.: Never true  Transportation Needs: No Transportation Needs (11/24/2023)   Received from [facility] - Transportation    In the past 12 months, has lack of transportation kept you from medical appointments or from getting medications?: No    Lack of Transportation (Non-Medical): No  Physical Activity: Not on file  Stress: Not on file  Social Connections: Unknown (06/07/2021)  Received from [facility]   Social Network    Social Network: Not on file  Intimate Partner Violence: Unknown (04/29/2021)   Received from [facility]   HITS    Physically Hurt: Not on file    Insult or Talk Down To: Not on file    Threaten Physical Harm: Not on file    Scream or Curse: Not on file    Family History:    Family History  Problem Relation Age of Onset   Cancer Mother        throat   Hypertension Mother    Heart attack Mother    Cancer Father        lung     ROS:  Please see the history of present illness.   All other ROS reviewed and negative.     Physical Exam/Data: Vitals:   12/13/23 0330 12/13/23 0400 12/13/23 0420 12/13/23 0630  BP: 131/86 (!) 149/96  125/79  Pulse: 92 68 89 74  Resp: 16 18 20 14   Temp:    97.9 F (36.6 C)  TempSrc:    Oral  SpO2: 98% 96% 96% 96%  Weight:      Height:        Intake/Output Summary (Last 24 hours) at 12/13/2023 0725 Last data filed at 12/13/2023 0710 Gross per 24 hour  Intake 100 ml  Output --  Net 100 ml      12/13/2023    2:45 AM  07/15/2022    8:51 AM 05/31/2022    3:41 PM  Last 3 Weights  Weight (lbs) 160 lb 160 lb 165 lb 9.6 oz  Weight (kg) 72.576 kg 72.576 kg 75.116 kg     Body mass index is 25.06 kg/m.  General:  Well nourished, well developed, in no acute distress HEENT: normal Neck: no JVD Vascular: No carotid bruits; Distal pulses 2+ bilaterally Cardiac:  normal S1, S2; Irreg Irreg; no murmur  Lungs:  clear to auscultation bilaterally, no wheezing, rhonchi or rales  Abd: soft, nontender, no hepatomegaly  Ext: no edema Musculoskeletal:  No deformities, BUE and BLE strength normal and equal Skin: warm and dry  Neuro:  CNs 2-12 intact, no focal abnormalities noted Psych:  Normal affect   EKG:  The EKG was personally reviewed and demonstrates: A-fib, 115 bpm, LVH, T wave inversions inferior leads Telemetry:  Telemetry was personally reviewed and demonstrates:  Afib HR 130s>70s  Relevant CV Studies:  Echo ordered  Laboratory Data: High Sensitivity Troponin:  No results for input(s): TROPONINIHS in the last 720 hours.   Chemistry Recent Labs  Lab 12/13/23 0336  NA 139  K 2.8*  CL 102  CO2 28  GLUCOSE 118*  BUN 16  CREATININE 0.81  CALCIUM 8.7*  MG 2.1  GFRNONAA >60  ANIONGAP 9    No results for input(s): PROT, ALBUMIN, AST, ALT, ALKPHOS, BILITOT in the last 168 hours. Lipids No results for input(s): CHOL, TRIG, HDL, LABVLDL, LDLCALC, CHOLHDL in the last 168 hours.  Hematology Recent Labs  Lab 12/13/23 0336  WBC 5.6  RBC 5.12  HGB 14.6  HCT 43.3  MCV 84.6  MCH 28.5  MCHC 33.7  RDW 12.6  PLT 262   Thyroid   Recent Labs  Lab 12/13/23 0336  TSH 3.550    BNPNo results for input(s): BNP, PROBNP in the last 168 hours.  DDimer  Recent Labs  Lab 12/13/23 0336  DDIMER 0.53*    Radiology/Studies:  DG Chest Port 1 View Result  Date: 12/13/2023 EXAM: 1 VIEW XRAY OF THE CHEST 12/13/2023 03:12:00 AM COMPARISON: None available. CLINICAL HISTORY: Chest  pain FINDINGS: LUNGS AND PLEURA: Low lung volumes with bronchovascular crowding. Left basilar atelectasis. No pleural effusion. No pneumothorax. HEART AND MEDIASTINUM: No acute abnormality of the cardiac and mediastinal silhouettes. BONES AND SOFT TISSUES: No acute osseous abnormality. IMPRESSION: 1. Low lung volumes with bronchovascular crowding and left basilar atelectasis. Electronically signed by: Dorethia Molt MD 12/13/2023 03:57 AM EST RP Workstation: [id number]     Assessment and Plan:  New onset rapid A-fib - Patient presented with palpitations found to be in rapid A-fib started on IV Cardizem found to have hypokalemia - CHA2DS2-VASc at least 1(HTN)  - K 2.8. Replete potassium, goal > 4 - Keep magnesium > 2 - TSH normal - Echo ordered - IV dilt 5mL/hr - PTA Coreg 6.25 mg twice daily>increased to 12.5mg BID - he remains in Afib with rates are better in the 70s - will start SQ Lovenox as we are still awaiting echo. If EF is normal, can transition to Eliquis. - can transition IV dilt to oral dilt and plan for DCCV as OP after 4 weeks of anticoagulation. Plan for Eliquis 5mg  BID as he may require require DCCV. May not need this long-term though.  Hypokalemia - 2.8 on arrival s/p supplementation - PTA hydrochlorothiazide  12.5mg  daily held - Daily BMET  Hypertension - Normal pressures - IV Dilt 5mL/hr - Amlodipine  5 mg daily>will stop for LLE - Coreg 12.5mg BID - Benazepril  40 mg daily - PTA clonidine 0.1mg  BID held - transition IV dilt to oral dilt as above  For questions or updates, please contact [facility] Please consult [email or web address] for contact info under      Signed, Jeannifer Drakeford VEAR Fishman, PA-C  12/13/2023 7:25 AM

## 2023-12-19 DIAGNOSIS — I480 Paroxysmal atrial fibrillation: Secondary | ICD-10-CM | POA: Diagnosis not present

## 2023-12-19 DIAGNOSIS — I10 Essential (primary) hypertension: Secondary | ICD-10-CM | POA: Diagnosis not present

## 2023-12-19 DIAGNOSIS — R319 Hematuria, unspecified: Secondary | ICD-10-CM | POA: Diagnosis not present

## 2023-12-19 DIAGNOSIS — R310 Gross hematuria: Secondary | ICD-10-CM | POA: Diagnosis not present

## 2023-12-26 DIAGNOSIS — I480 Paroxysmal atrial fibrillation: Secondary | ICD-10-CM | POA: Diagnosis not present

## 2024-01-02 ENCOUNTER — Encounter: Payer: Self-pay | Admitting: Medical

## 2024-01-02 ENCOUNTER — Ambulatory Visit: Admitting: Urology

## 2024-01-02 ENCOUNTER — Ambulatory Visit: Attending: Medical | Admitting: Medical

## 2024-01-02 ENCOUNTER — Encounter: Payer: Self-pay | Admitting: Urology

## 2024-01-02 VITALS — BP 158/70 | HR 58 | Ht 67.0 in | Wt 164.6 lb

## 2024-01-02 VITALS — BP 179/78 | HR 51 | Ht 67.0 in | Wt 163.0 lb

## 2024-01-02 DIAGNOSIS — Z79899 Other long term (current) drug therapy: Secondary | ICD-10-CM | POA: Diagnosis not present

## 2024-01-02 DIAGNOSIS — I4891 Unspecified atrial fibrillation: Secondary | ICD-10-CM

## 2024-01-02 DIAGNOSIS — R310 Gross hematuria: Secondary | ICD-10-CM

## 2024-01-02 DIAGNOSIS — R319 Hematuria, unspecified: Secondary | ICD-10-CM | POA: Diagnosis not present

## 2024-01-02 DIAGNOSIS — I10 Essential (primary) hypertension: Secondary | ICD-10-CM | POA: Diagnosis not present

## 2024-01-02 DIAGNOSIS — N486 Induration penis plastica: Secondary | ICD-10-CM | POA: Diagnosis not present

## 2024-01-02 MED ORDER — AMLODIPINE BESYLATE 10 MG PO TABS: 10.0000 mg | ORAL_TABLET | Freq: Every day | ORAL | 3 refills | Status: AC

## 2024-01-02 NOTE — Patient Instructions (Signed)
 Medication Instructions:  Your physician recommends the following medication changes.  START TAKING: Amlodipine  10 mg by mouth daily   *If you need a refill on your cardiac medications before your next appointment, please call your pharmacy*  Lab Work: Your provider would like for you to have following labs drawn today CBC.     Testing/Procedures: No test ordered today   Follow-Up: At [facility], you and your health needs are our priority.  As part of our continuing mission to provide you with exceptional heart care, our providers are all part of one team.  This team includes your primary Cardiologist (physician) and Advanced Practice Providers or APPs (Physician Assistants and Nurse Practitioners) who all work together to provide you with the care you need, when you need it.  Your next appointment:   2-3 month(s)  Provider:   Mikey Fishman, PA-C

## 2024-01-02 NOTE — Patient Instructions (Addendum)
 Scheduling [phone]  Cystoscopy Cystoscopy is a procedure that is used to help diagnose and sometimes treat conditions that affect the lower urinary tract. The lower urinary tract includes the bladder and the urethra. The urethra is the tube that drains urine from the bladder. Cystoscopy is done using a thin, tube-shaped instrument with a light and camera at the end (cystoscope). The cystoscope may be hard or flexible, depending on the goal of the procedure. The cystoscope is inserted through the urethra, into the bladder. Cystoscopy may be recommended if you have: Urinary tract infections that keep coming back. Blood in the urine (hematuria). An inability to control when you urinate (urinary incontinence) or an overactive bladder. Unusual cells found in a urine sample. A blockage in the urethra, such as a urinary stone. Painful urination. An abnormality in the bladder found during an intravenous pyelogram (IVP) or CT scan. What are the risks? Generally, this is a safe procedure. However, problems may occur, including: Infection. Bleeding.  What happens during the procedure?  You will be given one or more of the following: A medicine to numb the area (local anesthetic). The area around the opening of your urethra will be cleaned. The cystoscope will be passed through your urethra into your bladder. Germ-free (sterile) fluid will flow through the cystoscope to fill your bladder. The fluid will stretch your bladder so that your health care provider can clearly examine your bladder walls. Your doctor will look at the urethra and bladder. The cystoscope will be removed The procedure may vary among health care providers  What can I expect after the procedure? After the procedure, it is common to have: Some soreness or pain in your urethra. Urinary symptoms. These include: Mild pain or burning when you urinate. Pain should stop within a few minutes after you urinate. This may last for  up to a few days after the procedure. A small amount of blood in your urine for several days. Feeling like you need to urinate but producing only a small amount of urine. Follow these instructions at home: General instructions Return to your normal activities as told by your health care provider.  Drink plenty of fluids after the procedure. Keep all follow-up visits as told by your health care provider. This is important. Contact a health care provider if you: Have pain that gets worse or does not get better with medicine, especially pain when you urinate lasting longer than 72 hours after the procedure. Have trouble urinating. Get help right away if you: Have blood clots in your urine. Have a fever or chills. Are unable to urinate. Summary Cystoscopy is a procedure that is used to help diagnose and sometimes treat conditions that affect the lower urinary tract. Cystoscopy is done using a thin, tube-shaped instrument with a light and camera at the end. After the procedure, it is common to have some soreness or pain in your urethra. It is normal to have blood in your urine after the procedure.  If you were prescribed an antibiotic medicine, take it as told by your health care provider.  This information is not intended to replace advice given to you by your health care provider. Make sure you discuss any questions you have with your health care provider. Document Revised: 01/02/2018 Document Reviewed: 01/02/2018 Elsevier Patient Education  2020 [employer].

## 2024-01-02 NOTE — Progress Notes (Signed)
 01/02/2024 1:22 PM   Shaun Carney 05/01/1960 [id number]  Referring provider: Lenon Layman ORN, MD [address] [facility] [place],  [place] [address]  Chief Complaint  Patient presents with   Hematuria    HPI: Shaun Carney is a 63 y.o. male referred for evaluation of gross hematuria.  Admitted [facility] last month for atrial fibrillation with RVR and started on Eliquis . 2 episodes of end stream hematuria in the last 2 weeks No bothersome LUTS or dysuria; recent urine culture negative No flank, abdominal or pelvic pain I previously saw him 06/2022 for Peyronie's disease with right lateral curvature at 45 degrees which he states has not changed  PMH: Past Medical History:  Diagnosis Date   Anxiety    COVID-19 05/24/2018   GERD (gastroesophageal reflux disease)    Hypertension     Surgical History: Past Surgical History:  Procedure Laterality Date   CATARACT EXTRACTION W/PHACO Left 09/29/2016   Procedure: CATARACT EXTRACTION PHACO AND INTRAOCULAR LENS PLACEMENT (IOC);  Surgeon: Myrna Adine Anes, MD;  Location: [facility] ORS;  Service: Ophthalmology;  Laterality: Left;  Lot # [id number] H US :00:40.4 AP%: 7.0 CDE: 2.87   COLONOSCOPY WITH PROPOFOL  N/A 04/03/2020   Procedure: COLONOSCOPY WITH BIOPSY;  Surgeon: Jinny Carmine, MD;  Location: [facility];  Service: Endoscopy;  Laterality: N/A;  leave first case   ESOPHAGOGASTRODUODENOSCOPY (EGD) WITH PROPOFOL  N/A 04/03/2020   Procedure: ESOPHAGOGASTRODUODENOSCOPY (EGD) WITH PROPOFOL ;  Surgeon: Jinny Carmine, MD;  Location: [facility];  Service: Endoscopy;  Laterality: N/A;   EYE SURGERY     INSERTION OF AHMED VALVE Left 05/18/2015   Procedure: INSERTION OF AHMED VALVE AND SCLERAL PATCH GRAFT TO LEFT EYE;  Surgeon: Donzell Arlyce Budd, MD;  Location: [facility];  Service: Ophthalmology;  Laterality: Left;  1ST CASE PER DR VIN   POLYPECTOMY N/A 04/03/2020   Procedure: POLYPECTOMY;  Surgeon: Jinny Carmine, MD;  Location: [facility];  Service: Endoscopy;  Laterality: N/A;    Home Medications:  Allergies as of 01/02/2024   No Known Allergies      Medication List        Accurate as of January 02, 2024  1:22 PM. If you have any questions, ask your nurse or doctor.          STOP taking these medications    clotrimazole -betamethasone  cream Commonly known as: LOTRISONE  Stopped by: Demorio Seeley C Jonay Hitchcock   potassium chloride  SA 20 MEQ tablet Commonly known as: KLOR-CON  M Stopped by: Glendia JAYSON Barba       TAKE these medications    albuterol  108 (90 Base) MCG/ACT inhaler Commonly known as: VENTOLIN  HFA Inhale 2 puffs into the lungs every 4 (four) hours as needed.   amLODipine  10 MG tablet Commonly known as: NORVASC  Take 1 tablet (10 mg total) by mouth daily. Started by: Mikey VEAR Fishman   apixaban  5 MG Tabs tablet Commonly known as: ELIQUIS  Take 1 tablet (5 mg total) by mouth 2 (two) times daily.   carvedilol  12.5 MG tablet Commonly known as: COREG  Take 1 tablet (12.5 mg total) by mouth 2 (two) times daily with a meal. What changed: how much to take   fluticasone  50 MCG/ACT nasal spray Commonly known as: FLONASE  SHAKE LIQUID AND USE 2 SPRAYS IN EACH NOSTRIL EVERY DAY   fluticasone -salmeterol 100-50 MCG/ACT Aepb Commonly known as: ADVAIR Inhale 1 puff into the lungs.   olmesartan  40 MG tablet Commonly known as: BENICAR  Take 1 tablet (40 mg  total) by mouth daily.   pantoprazole  40 MG tablet Commonly known as: PROTONIX  Take 1 tablet by mouth once daily   tadalafil  5 MG tablet Commonly known as: CIALIS  Take 5 mg by mouth daily.        Allergies: No Known Allergies  Family History: Family History  Problem Relation Age of Onset   Cancer Mother        throat   Hypertension Mother    Heart attack Mother    Cancer Father        lung    Social History:  reports that he has never smoked. He has never used smokeless tobacco. He reports current  alcohol use of about 6.0 standard drinks of alcohol per week. He reports that he does not use drugs.   Physical Exam: BP (!) 179/78   Pulse (!) 51   Ht 5' 7 (1.702 m)   Wt 163 lb (73.9 kg)   BMI 25.53 kg/m   Constitutional:  Alert, No acute distress. HEENT: [place] AT Respiratory: Normal respiratory effort, no increased work of breathing. Psychiatric: Normal mood and affect.  Laboratory Data:  Urinalysis Dipstick/microscopy negative  Assessment & Plan:    1.  Gross hematuria AUA risk stratification: High We discussed the recommended evaluation of high risk hematuria which consist of CT urogram and cystoscopy.  The procedures were discussed in detail and he has elected to proceed with further evaluation All questions were answered CTU order placed and cystoscopy was scheduled  2.  Peyronie's disease He is interested in pursuing treatment and was given literature on Xiaflex    [name] C Maguire Killmer, MD  [facility] [address], [address] [place], [place] [address] [phone]

## 2024-01-02 NOTE — Progress Notes (Signed)
 Cardiology Office Note   Date:  01/02/2024  ID:  Shaun Carney, DOB [birth year], MRN [id number] PCP: Lenon Layman ORN, MD  [facility] Providers Cardiologist:  None    History of Present Illness Shaun Carney is a 63 y.o. male with a h/o HTN, GERD, anxiety, and paroxysmal afib who is being seen for hospital follow-up.   The patient presented to [facility] 12/13/23 with palpitations found to have rapid Afib started on IV dilt and IV heparin. K 2.8 and potassium was repleted. Echo showed normal pump function and IV heparin was transitioned to Eliquis . Coreg  was increase to 25mg  BID.   Today the patient is in sinus bradycardia. He noted blood in his urine and he stopped Eliquis  for a few days. He restarted and had brief recurrent blood in his urine, but nothing the last few days.The patient denies chest pain. He has occasional shortness of breath after he takes all his AM pills. He just took off his heart monitor.   Studies Reviewed EKG Interpretation Date/Time:  Tuesday January 02 2024 11:07:22 EST Ventricular Rate:  58 PR Interval:  158 QRS Duration:  80 QT Interval:  440 QTC Calculation: 431 R Axis:   -1  Text Interpretation: Sinus bradycardia Anteroseptal infarct (cited on or before 13-Dec-2023) When compared with ECG of 13-Dec-2023 02:45, Sinus rhythm has replaced Atrial fibrillation Vent. rate has decreased BY  57 BPM Questionable change in initial forces of Septal leads Confirmed by Franchester, Macgregor Aeschliman ([id number]) on 01/02/2024 11:08:04 AM    Echo 11/2023 1. Left ventricular ejection fraction, by estimation, is 60 to 65%. The  left ventricle has normal function. The left ventricle has no regional  wall motion abnormalities. There is mild left ventricular hypertrophy.  Left ventricular diastolic parameters  are indeterminate. The average left ventricular global longitudinal strain  is -18.6 %. The global longitudinal strain is normal.   2. Right ventricular systolic function is  normal. The right ventricular  size is normal.   3. The mitral valve is normal in structure. Mild mitral valve  regurgitation. No evidence of mitral stenosis.   4. The aortic valve is tricuspid. Aortic valve regurgitation is mild.  Aortic valve sclerosis is present, with no evidence of aortic valve  stenosis.   5. The inferior vena cava is normal in size with greater than 50%  respiratory variability, suggesting right atrial pressure of 3 mmHg.       Physical Exam VS:  BP (!) 158/70   Pulse (!) 58   Ht 5' 7 (1.702 m)   Wt 164 lb 9.6 oz (74.7 kg)   SpO2 98%   BMI 25.78 kg/m        Wt Readings from Last 3 Encounters:  01/02/24 164 lb 9.6 oz (74.7 kg)  12/13/23 160 lb (72.6 kg)  07/15/22 160 lb (72.6 kg)    GEN: Well nourished, well developed in no acute distress NECK: No JVD; No carotid bruits CARDIAC: RRR, no murmurs, rubs, gallops RESPIRATORY:  Clear to auscultation without rales, wheezing or rhonchi  ABDOMEN: Soft, non-tender, non-distended EXTREMITIES:  No edema; No deformity   ASSESSMENT AND PLAN  Paroxysmal Afib Hematuria  The patient is in sinus bradycardia. He noted hematuria 1-2 weeks ago and held Eliquis  for a few days with improvement. He restarted Eliquis , and had brief recurrent hematuria, but none in the last few days. He has an appointment with urology today. CBC today. Continue Eliquis  5mg BID. Continue Coreg  25mg  BID for rate control. He just removed his  heart monitor.   HTN BP has been elevated. I will start amlodipine  10mg  daily. Continue Coreg  25mg  BID and Olmesartan  40mg  daily.     Dispo: Follow-up in 2-3 months  Signed, Zebulan Hinshaw VEAR Fishman, PA-C

## 2024-01-03 DIAGNOSIS — M2022 Hallux rigidus, left foot: Secondary | ICD-10-CM | POA: Diagnosis not present

## 2024-01-03 DIAGNOSIS — M79672 Pain in left foot: Secondary | ICD-10-CM | POA: Diagnosis not present

## 2024-01-03 LAB — URINALYSIS, COMPLETE
Bilirubin, UA: NEGATIVE
Glucose, UA: NEGATIVE
Ketones, UA: NEGATIVE
Leukocytes,UA: NEGATIVE
Nitrite, UA: NEGATIVE
RBC, UA: NEGATIVE
Specific Gravity, UA: 1.025 (ref 1.005–1.030)
Urobilinogen, Ur: 1 mg/dL (ref 0.2–1.0)
pH, UA: 6 (ref 5.0–7.5)

## 2024-01-03 LAB — MICROSCOPIC EXAMINATION

## 2024-01-03 LAB — CBC
Hematocrit: 41.9 % (ref 37.5–51.0)
Hemoglobin: 13.4 g/dL (ref 13.0–17.7)
MCH: 29.3 pg (ref 26.6–33.0)
MCHC: 32 g/dL (ref 31.5–35.7)
MCV: 92 fL (ref 79–97)
Platelets: 263 x10E3/uL (ref 150–450)
RBC: 4.58 x10E6/uL (ref 4.14–5.80)
RDW: 12.4 % (ref 11.6–15.4)
WBC: 4.9 x10E3/uL (ref 3.4–10.8)

## 2024-01-04 ENCOUNTER — Ambulatory Visit: Payer: Self-pay | Admitting: Medical

## 2024-01-08 DIAGNOSIS — I480 Paroxysmal atrial fibrillation: Secondary | ICD-10-CM | POA: Diagnosis not present

## 2024-01-09 ENCOUNTER — Ambulatory Visit: Admission: RE | Admit: 2024-01-09 | Discharge: 2024-01-09 | Attending: Urology | Admitting: Urology

## 2024-01-09 DIAGNOSIS — R310 Gross hematuria: Secondary | ICD-10-CM

## 2024-01-09 DIAGNOSIS — K4090 Unilateral inguinal hernia, without obstruction or gangrene, not specified as recurrent: Secondary | ICD-10-CM | POA: Diagnosis not present

## 2024-01-09 DIAGNOSIS — K439 Ventral hernia without obstruction or gangrene: Secondary | ICD-10-CM | POA: Diagnosis not present

## 2024-01-09 DIAGNOSIS — K5730 Diverticulosis of large intestine without perforation or abscess without bleeding: Secondary | ICD-10-CM | POA: Diagnosis not present

## 2024-01-09 MED ORDER — IOHEXOL 300 MG/ML  SOLN
100.0000 mL | Freq: Once | INTRAMUSCULAR | Status: AC | PRN
  Administered 2024-01-09: 14:00:00 100 mL via INTRAVENOUS

## 2024-01-14 ENCOUNTER — Ambulatory Visit: Payer: Self-pay | Admitting: Urology

## 2024-02-07 ENCOUNTER — Other Ambulatory Visit: Admitting: Urology

## 2024-02-08 ENCOUNTER — Encounter: Payer: Self-pay | Admitting: Urology

## 2024-03-04 ENCOUNTER — Ambulatory Visit: Admitting: Medical
